# Patient Record
Sex: Male | Born: 1950 | Race: White | Hispanic: No | Marital: Married | Smoking: Never smoker
Health system: Southern US, Community
[De-identification: ages and names within clinical notes are randomized; demographics above are authoritative.]

## PROBLEM LIST (undated history)

## (undated) DIAGNOSIS — I2510 Atherosclerotic heart disease of native coronary artery without angina pectoris: Secondary | ICD-10-CM

## (undated) DIAGNOSIS — K219 Gastro-esophageal reflux disease without esophagitis: Secondary | ICD-10-CM

## (undated) DIAGNOSIS — I10 Essential (primary) hypertension: Secondary | ICD-10-CM

## (undated) DIAGNOSIS — E7800 Pure hypercholesterolemia, unspecified: Secondary | ICD-10-CM

## (undated) DIAGNOSIS — I219 Acute myocardial infarction, unspecified: Secondary | ICD-10-CM

## (undated) HISTORY — PX: COLONOSCOPY: SHX174

## (undated) HISTORY — PX: CARDIAC SURGERY: SHX584

## (undated) HISTORY — PX: OTHER SURGICAL HISTORY: SHX169

---

## 1996-04-07 DIAGNOSIS — I219 Acute myocardial infarction, unspecified: Secondary | ICD-10-CM

## 1996-04-07 HISTORY — DX: Acute myocardial infarction, unspecified: I21.9

## 1997-10-27 ENCOUNTER — Inpatient Hospital Stay (HOSPITAL_COMMUNITY): Admission: EM | Admit: 1997-10-27 | Discharge: 1997-10-31 | Payer: Self-pay | Admitting: Cardiology

## 2001-06-22 ENCOUNTER — Ambulatory Visit (HOSPITAL_COMMUNITY): Admission: RE | Admit: 2001-06-22 | Discharge: 2001-06-22 | Payer: Self-pay | Admitting: Internal Medicine

## 2001-12-21 ENCOUNTER — Encounter (HOSPITAL_COMMUNITY): Admission: RE | Admit: 2001-12-21 | Discharge: 2002-01-20 | Payer: Self-pay | Admitting: Internal Medicine

## 2001-12-22 ENCOUNTER — Encounter: Payer: Self-pay | Admitting: Internal Medicine

## 2003-06-15 ENCOUNTER — Ambulatory Visit (HOSPITAL_COMMUNITY): Admission: RE | Admit: 2003-06-15 | Discharge: 2003-06-15 | Payer: Self-pay | Admitting: Internal Medicine

## 2004-04-02 ENCOUNTER — Ambulatory Visit (HOSPITAL_COMMUNITY): Admission: RE | Admit: 2004-04-02 | Discharge: 2004-04-02 | Payer: Self-pay | Admitting: Internal Medicine

## 2004-04-22 ENCOUNTER — Emergency Department (HOSPITAL_COMMUNITY): Admission: EM | Admit: 2004-04-22 | Discharge: 2004-04-22 | Payer: Self-pay | Admitting: Emergency Medicine

## 2004-04-30 ENCOUNTER — Encounter (HOSPITAL_COMMUNITY): Admission: RE | Admit: 2004-04-30 | Discharge: 2004-05-01 | Payer: Self-pay | Admitting: Internal Medicine

## 2006-07-02 IMAGING — CR DG SHOULDER 2+V*R*
3 series · 3 of 3 positions shown · non-contrast
Comparison: none

HISTORY: Right shoulder pain, fall

RIGHT SHOULDER 3 VIEWS:
AC joint alignment normal.
No glenohumeral fracture or dislocation.
Mild diffuse bony demineralization.

[view not recorded (1 of 3)]
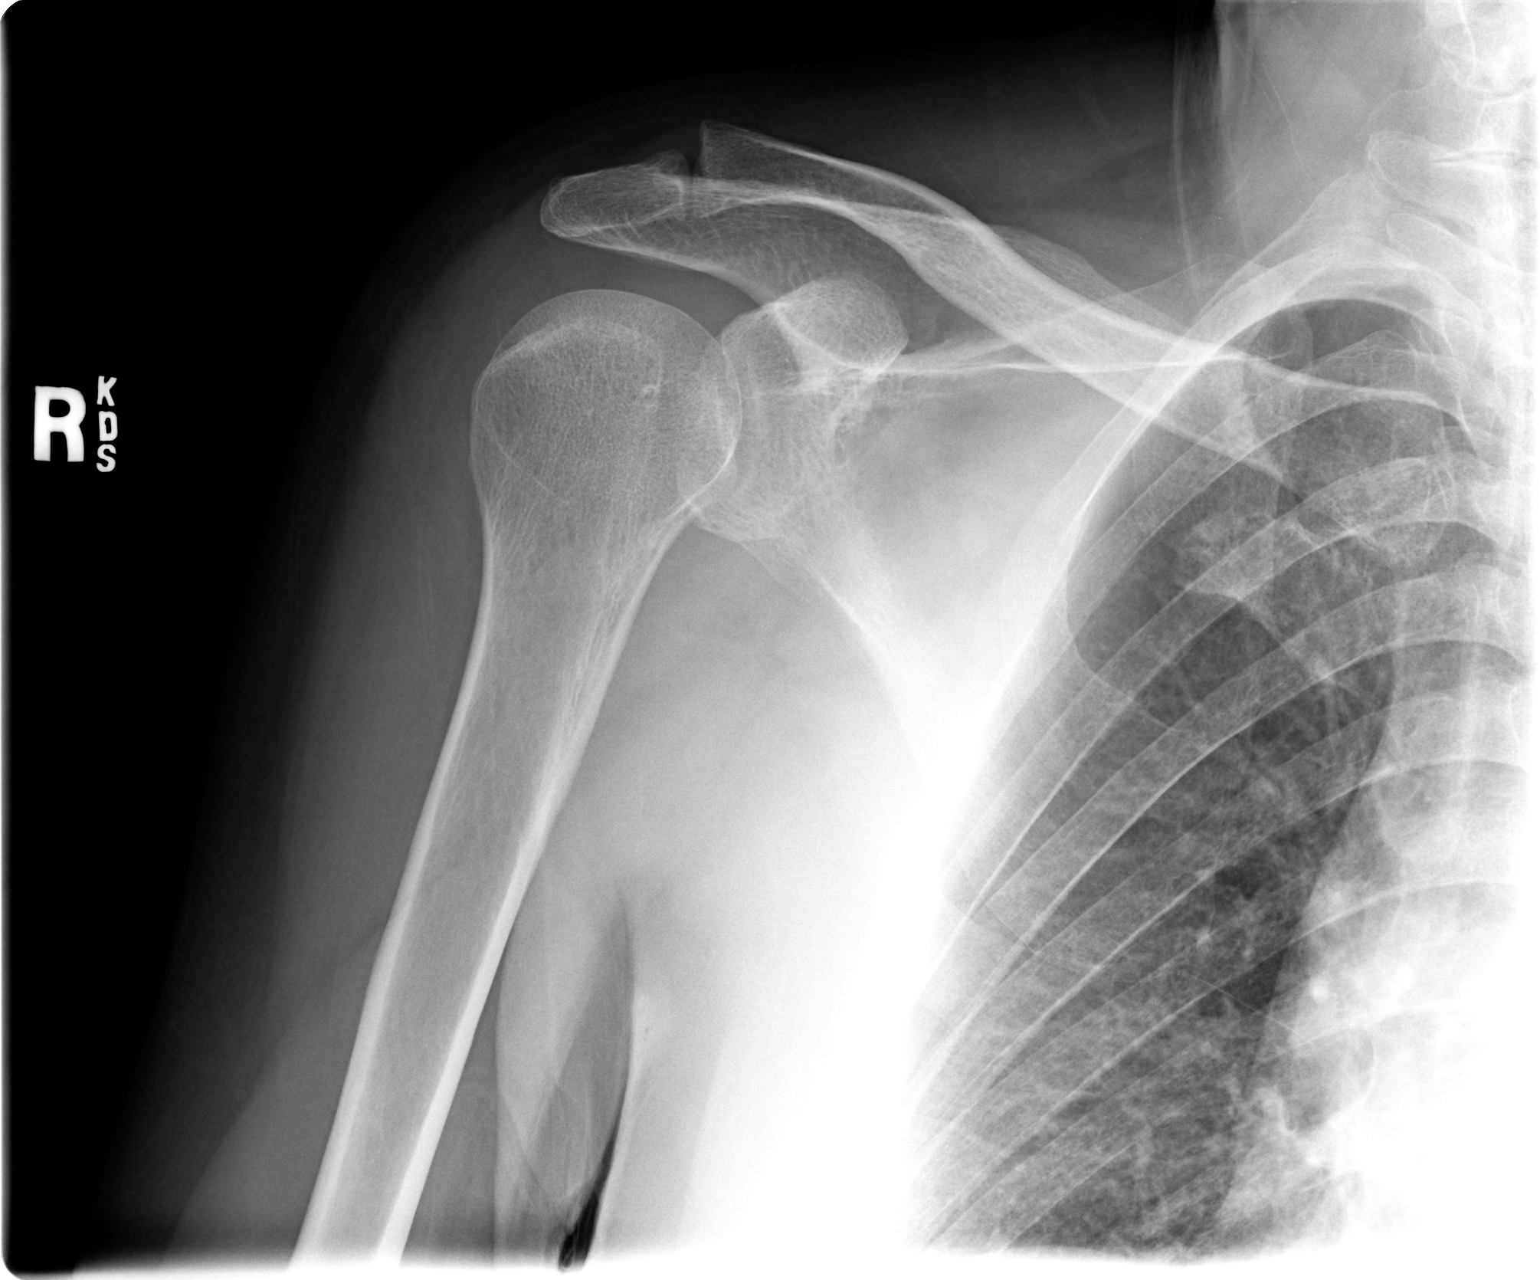

[view not recorded (2 of 3)]
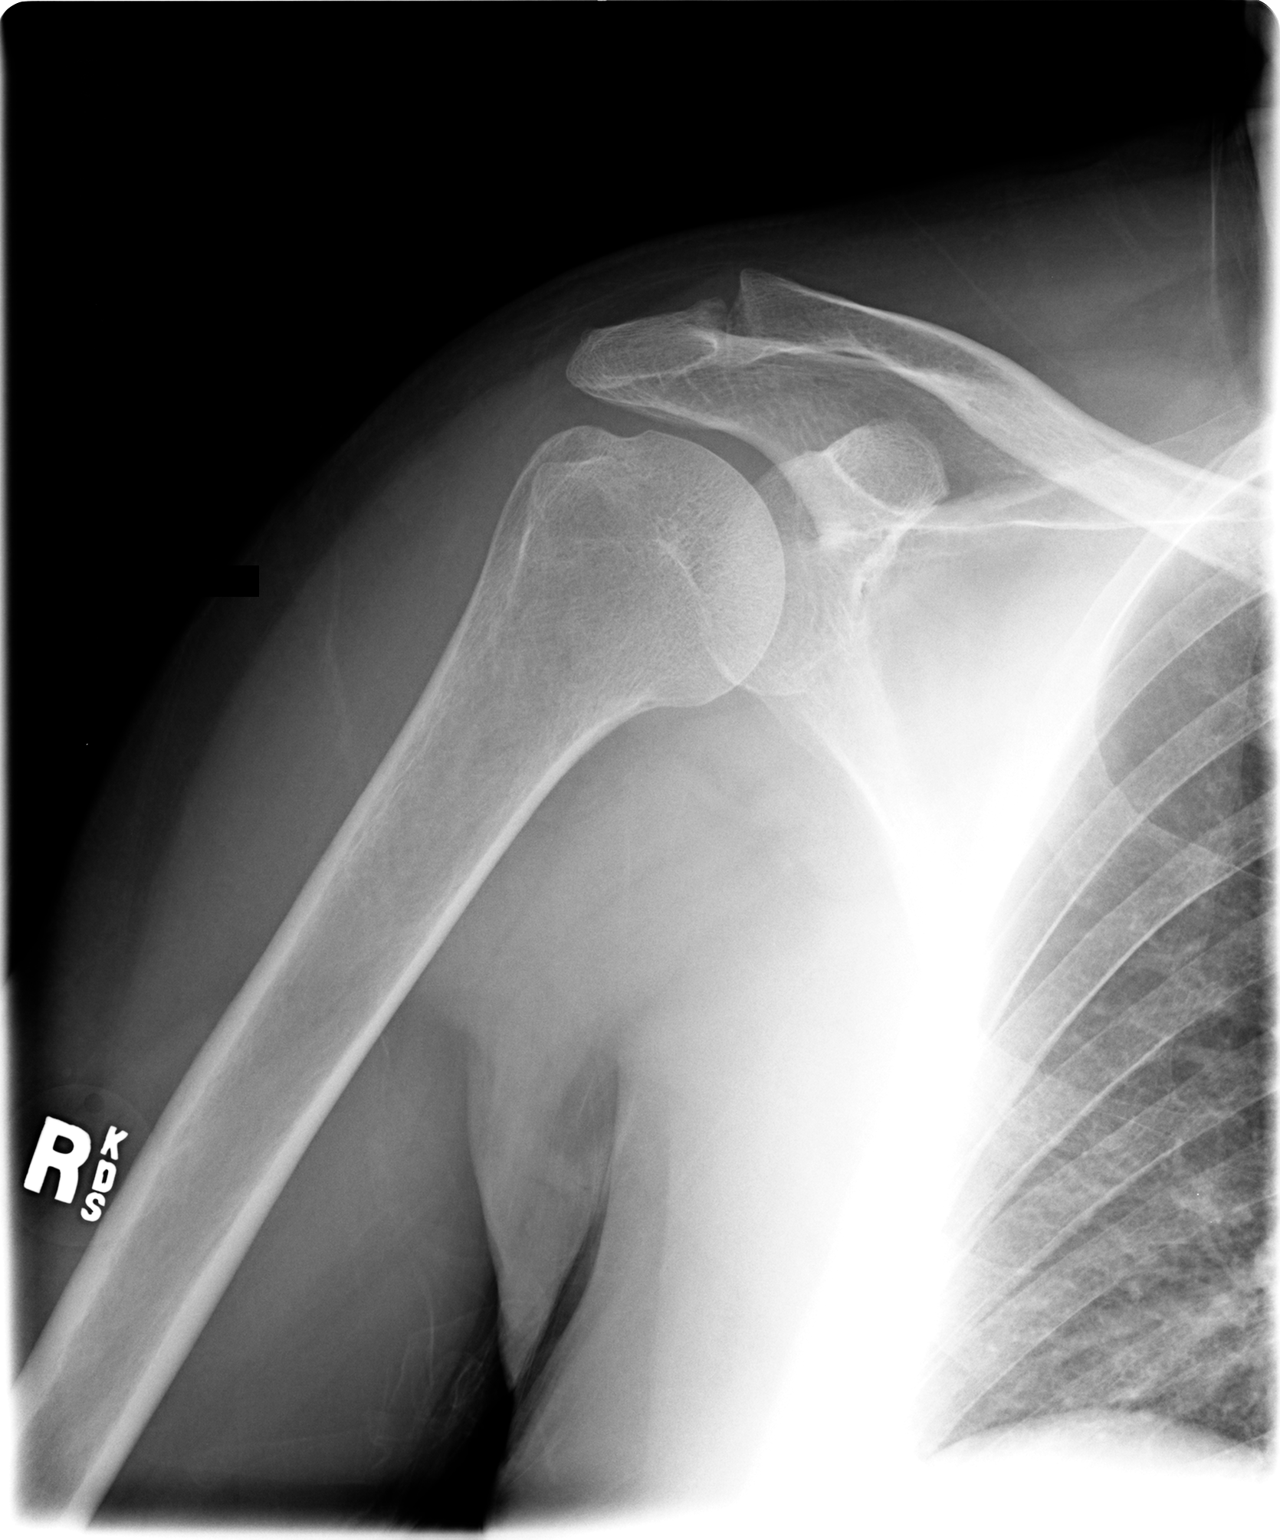

[view not recorded (3 of 3)]
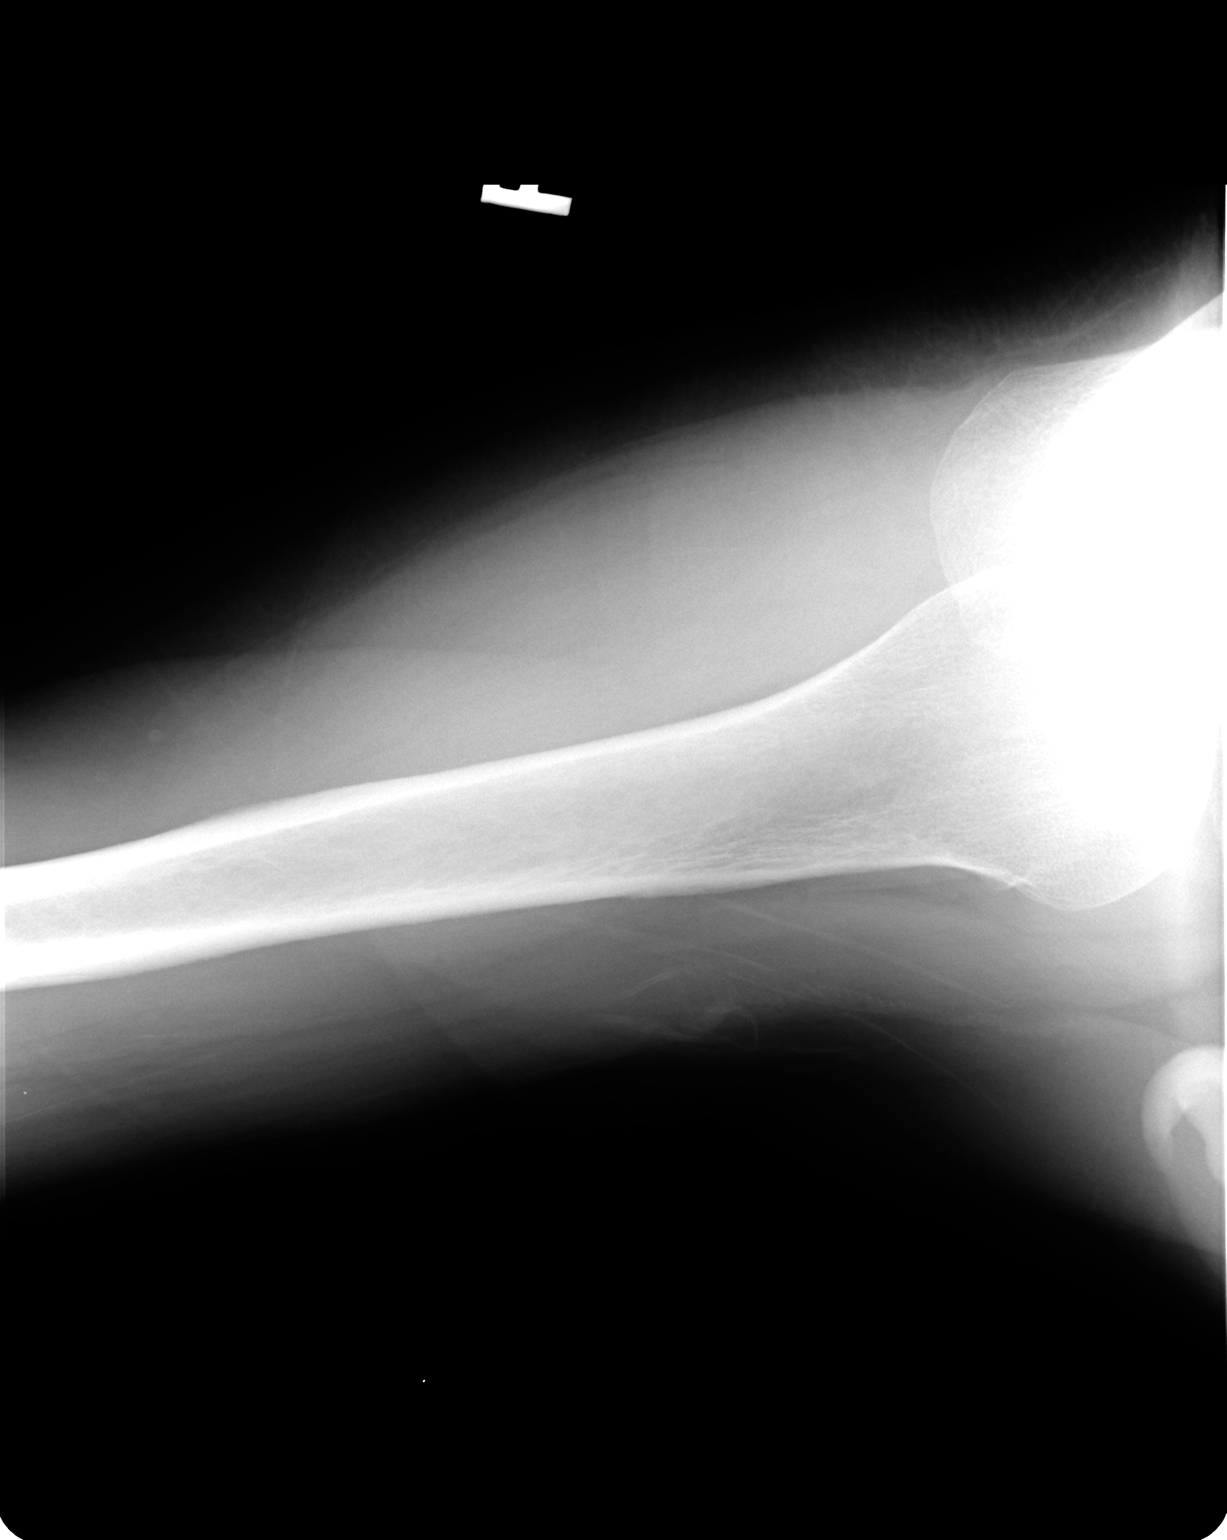

[3 of 3 positions shown; findings below may reference images not displayed]

IMPRESSION: No acute bony abnormalities.

## 2006-11-12 ENCOUNTER — Emergency Department (HOSPITAL_COMMUNITY): Admission: EM | Admit: 2006-11-12 | Discharge: 2006-11-12 | Payer: Self-pay | Admitting: Emergency Medicine

## 2006-12-18 ENCOUNTER — Ambulatory Visit: Payer: Self-pay | Admitting: Cardiology

## 2007-04-08 HISTORY — PX: CARDIAC CATHETERIZATION: SHX172

## 2007-06-25 ENCOUNTER — Encounter: Payer: Self-pay | Admitting: Emergency Medicine

## 2007-06-25 ENCOUNTER — Ambulatory Visit: Payer: Self-pay | Admitting: Cardiology

## 2007-06-25 ENCOUNTER — Inpatient Hospital Stay (HOSPITAL_COMMUNITY): Admission: AD | Admit: 2007-06-25 | Discharge: 2007-06-29 | Payer: Self-pay | Admitting: Cardiology

## 2007-06-28 ENCOUNTER — Encounter: Payer: Self-pay | Admitting: Cardiology

## 2007-07-02 ENCOUNTER — Ambulatory Visit: Payer: Self-pay | Admitting: Cardiology

## 2007-07-13 ENCOUNTER — Encounter (HOSPITAL_COMMUNITY): Admission: RE | Admit: 2007-07-13 | Discharge: 2007-08-12 | Payer: Self-pay | Admitting: Cardiology

## 2007-07-30 ENCOUNTER — Ambulatory Visit: Payer: Self-pay | Admitting: Internal Medicine

## 2007-08-13 ENCOUNTER — Encounter (HOSPITAL_COMMUNITY): Admission: RE | Admit: 2007-08-13 | Discharge: 2007-09-12 | Payer: Self-pay | Admitting: Cardiology

## 2007-09-13 ENCOUNTER — Encounter (HOSPITAL_COMMUNITY): Admission: RE | Admit: 2007-09-13 | Discharge: 2007-10-13 | Payer: Self-pay | Admitting: Cardiology

## 2007-10-15 ENCOUNTER — Encounter (HOSPITAL_COMMUNITY): Admission: RE | Admit: 2007-10-15 | Discharge: 2007-11-14 | Payer: Self-pay | Admitting: Cardiology

## 2007-12-23 ENCOUNTER — Ambulatory Visit: Payer: Self-pay | Admitting: Cardiovascular Disease

## 2007-12-23 ENCOUNTER — Encounter: Payer: Self-pay | Admitting: Emergency Medicine

## 2007-12-23 ENCOUNTER — Observation Stay (HOSPITAL_COMMUNITY): Admission: EM | Admit: 2007-12-23 | Discharge: 2007-12-24 | Payer: Self-pay | Admitting: Cardiology

## 2008-01-03 ENCOUNTER — Ambulatory Visit: Payer: Self-pay | Admitting: Cardiology

## 2008-04-18 ENCOUNTER — Ambulatory Visit: Payer: Self-pay | Admitting: Cardiology

## 2008-06-01 ENCOUNTER — Ambulatory Visit: Payer: Self-pay | Admitting: Cardiology

## 2008-12-31 ENCOUNTER — Inpatient Hospital Stay (HOSPITAL_COMMUNITY): Admission: EM | Admit: 2008-12-31 | Discharge: 2009-01-01 | Payer: Self-pay | Admitting: Emergency Medicine

## 2008-12-31 ENCOUNTER — Ambulatory Visit: Payer: Self-pay | Admitting: Cardiology

## 2008-12-31 ENCOUNTER — Encounter (INDEPENDENT_AMBULATORY_CARE_PROVIDER_SITE_OTHER): Payer: Self-pay | Admitting: *Deleted

## 2008-12-31 LAB — CONVERTED CEMR LAB
BUN: 10 mg/dL
CO2: 28 meq/L
Calcium: 9.2 mg/dL
Chloride: 110 meq/L
Creatinine, Ser: 0.81 mg/dL
GFR calc non Af Amer: >60 mL/min
Glomerular Filtration Rate, Af Am: >60 mL/min/{1.73_m2}
Glucose, Bld: 187 mg/dL
HCT: 42.5 %
Hemoglobin, Urine: NEGATIVE
Hemoglobin: 15.1 g/dL
MCV: 95.4 fL
Nitrite: NEGATIVE
Platelets: 157 10*3/uL
Potassium: 3.4 meq/L
Protein, ur: NEGATIVE mg/dL
Sodium: 142 meq/L
Specific Gravity, Urine: 1.02
Urine Glucose: 500 mg/dL
WBC number, urine, microscopy: NEGATIVE /hpf
WBC: 7.1 10*3/uL
pH: 6

## 2009-01-01 ENCOUNTER — Encounter: Payer: Self-pay | Admitting: Cardiology

## 2009-01-02 ENCOUNTER — Ambulatory Visit: Payer: Self-pay | Admitting: Cardiology

## 2009-01-02 ENCOUNTER — Encounter (HOSPITAL_COMMUNITY): Admission: RE | Admit: 2009-01-02 | Discharge: 2009-02-01 | Payer: Self-pay | Admitting: Cardiology

## 2009-01-15 DIAGNOSIS — E7800 Pure hypercholesterolemia, unspecified: Secondary | ICD-10-CM | POA: Insufficient documentation

## 2009-01-15 DIAGNOSIS — J310 Chronic rhinitis: Secondary | ICD-10-CM | POA: Insufficient documentation

## 2009-01-16 ENCOUNTER — Encounter: Payer: Self-pay | Admitting: Adult Health

## 2009-01-16 ENCOUNTER — Encounter (INDEPENDENT_AMBULATORY_CARE_PROVIDER_SITE_OTHER): Payer: Self-pay | Admitting: *Deleted

## 2009-01-16 ENCOUNTER — Ambulatory Visit: Payer: Self-pay | Admitting: Cardiology

## 2009-03-05 ENCOUNTER — Encounter (INDEPENDENT_AMBULATORY_CARE_PROVIDER_SITE_OTHER): Payer: Self-pay

## 2009-03-05 LAB — CONVERTED CEMR LAB
ALT: 15 units/L
AST: 15 units/L
Albumin: 4.4 g/dL
BUN: 14 mg/dL
CO2: 23 meq/L
Calcium: 9.2 mg/dL
Chloride: 110 meq/L
Cholesterol: 132 mg/dL
Creatinine, Ser: 0.79 mg/dL
Glucose, Bld: 89 mg/dL
HCT: 43.2 %
HDL: 3.6 mg/dL
HDL: 36 mg/dL
Hemoglobin: 15.2 g/dL
LDL Cholesterol: 60 mg/dL
MCV: 92.5 fL
Platelets: 161 10*3/uL
Potassium: 4.2 meq/L
Sodium: 143 meq/L
Total Protein: 6.5 g/dL
Triglycerides: 176 mg/dL
WBC: 6.6 10*3/uL

## 2009-03-12 ENCOUNTER — Encounter (INDEPENDENT_AMBULATORY_CARE_PROVIDER_SITE_OTHER): Payer: Self-pay

## 2009-08-26 DIAGNOSIS — I2510 Atherosclerotic heart disease of native coronary artery without angina pectoris: Secondary | ICD-10-CM | POA: Insufficient documentation

## 2009-08-26 DIAGNOSIS — K219 Gastro-esophageal reflux disease without esophagitis: Secondary | ICD-10-CM | POA: Insufficient documentation

## 2009-08-27 ENCOUNTER — Ambulatory Visit: Payer: Self-pay | Admitting: Cardiology

## 2009-09-23 IMAGING — CR DG CHEST 2V
2 series · 2 of 2 positions shown · non-contrast
Comparison: 06/25/07

CLINICAL DATA: Precardiac cath.  Chest pain, short of breath.
 CHEST ? 2 VIEW:

[w chest pa]
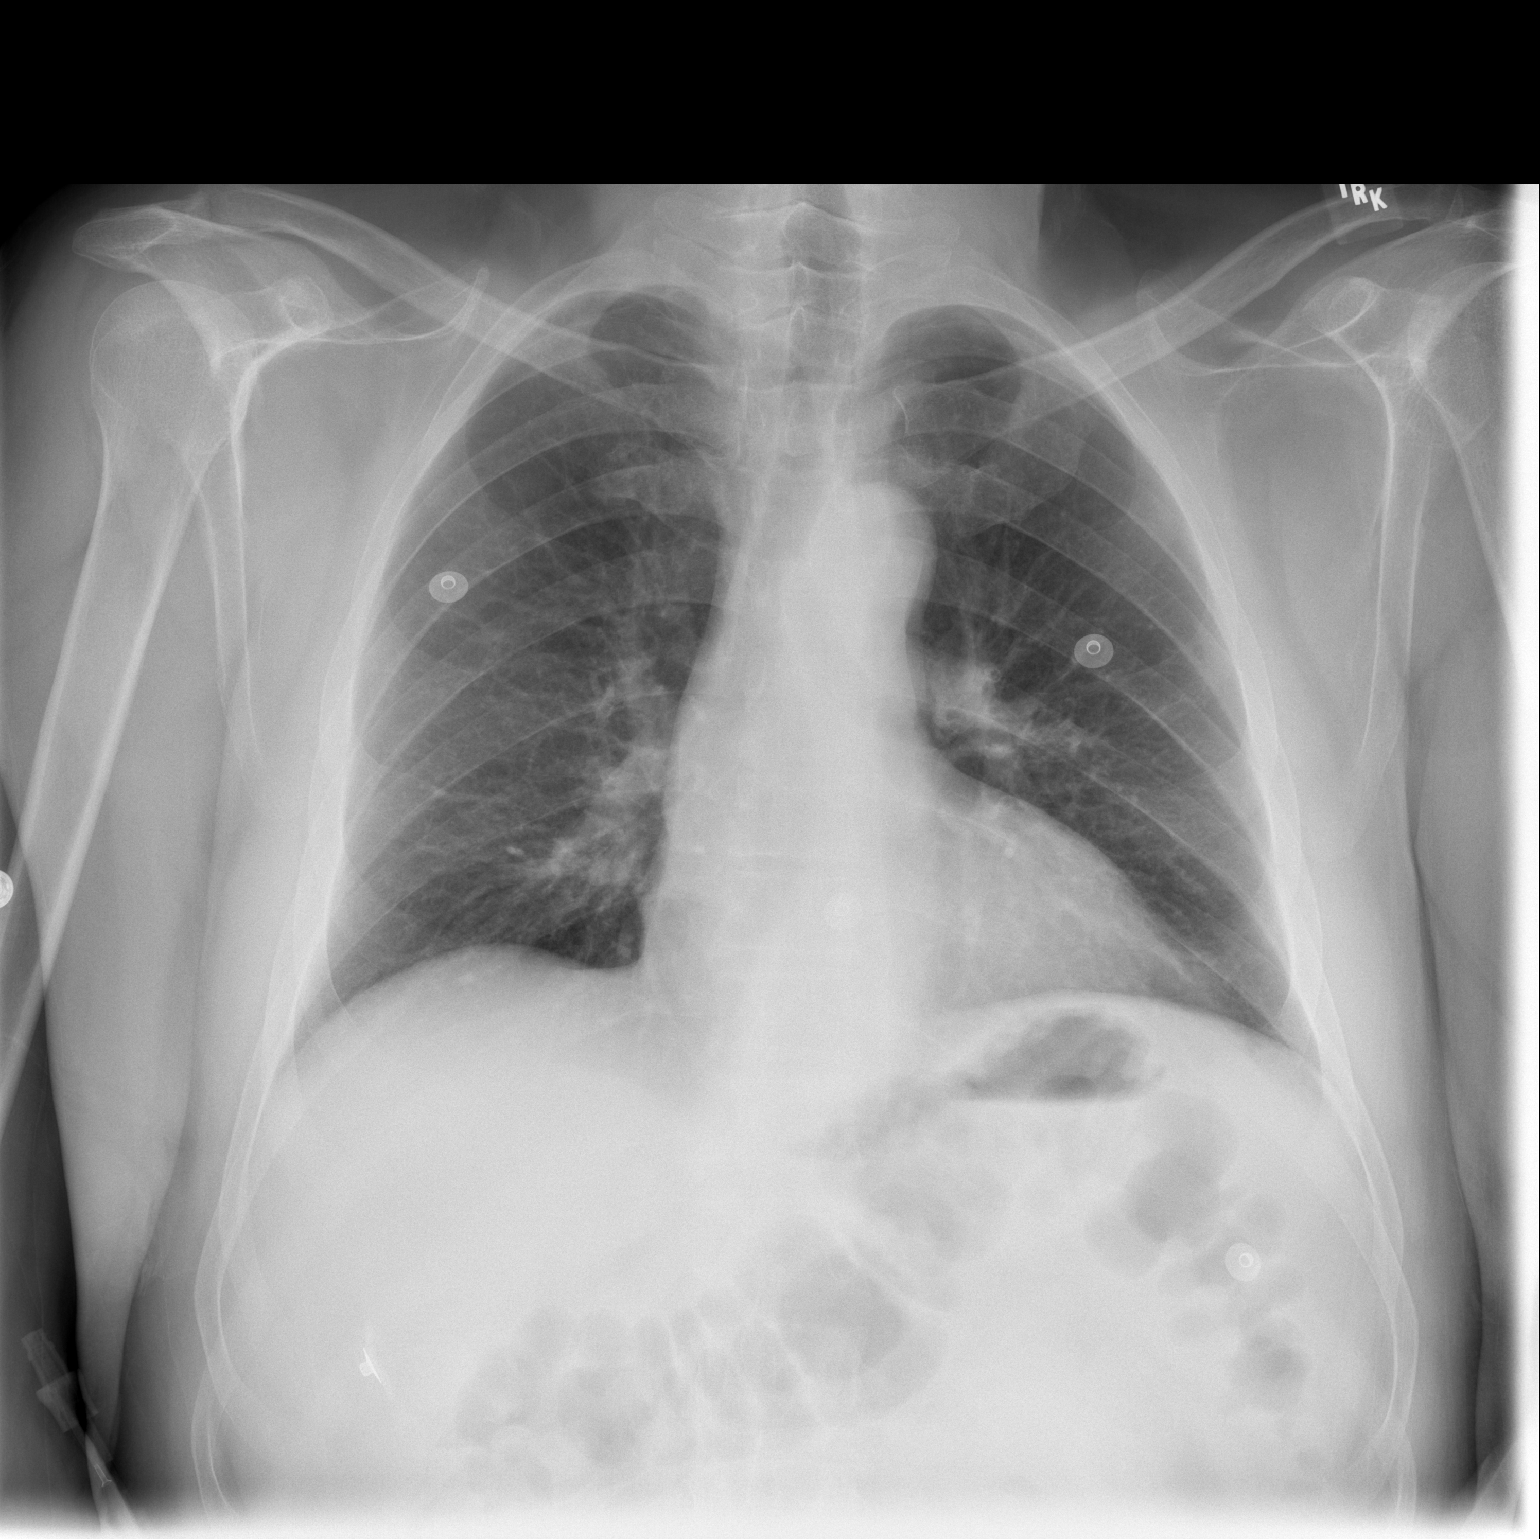

[w chest lat]
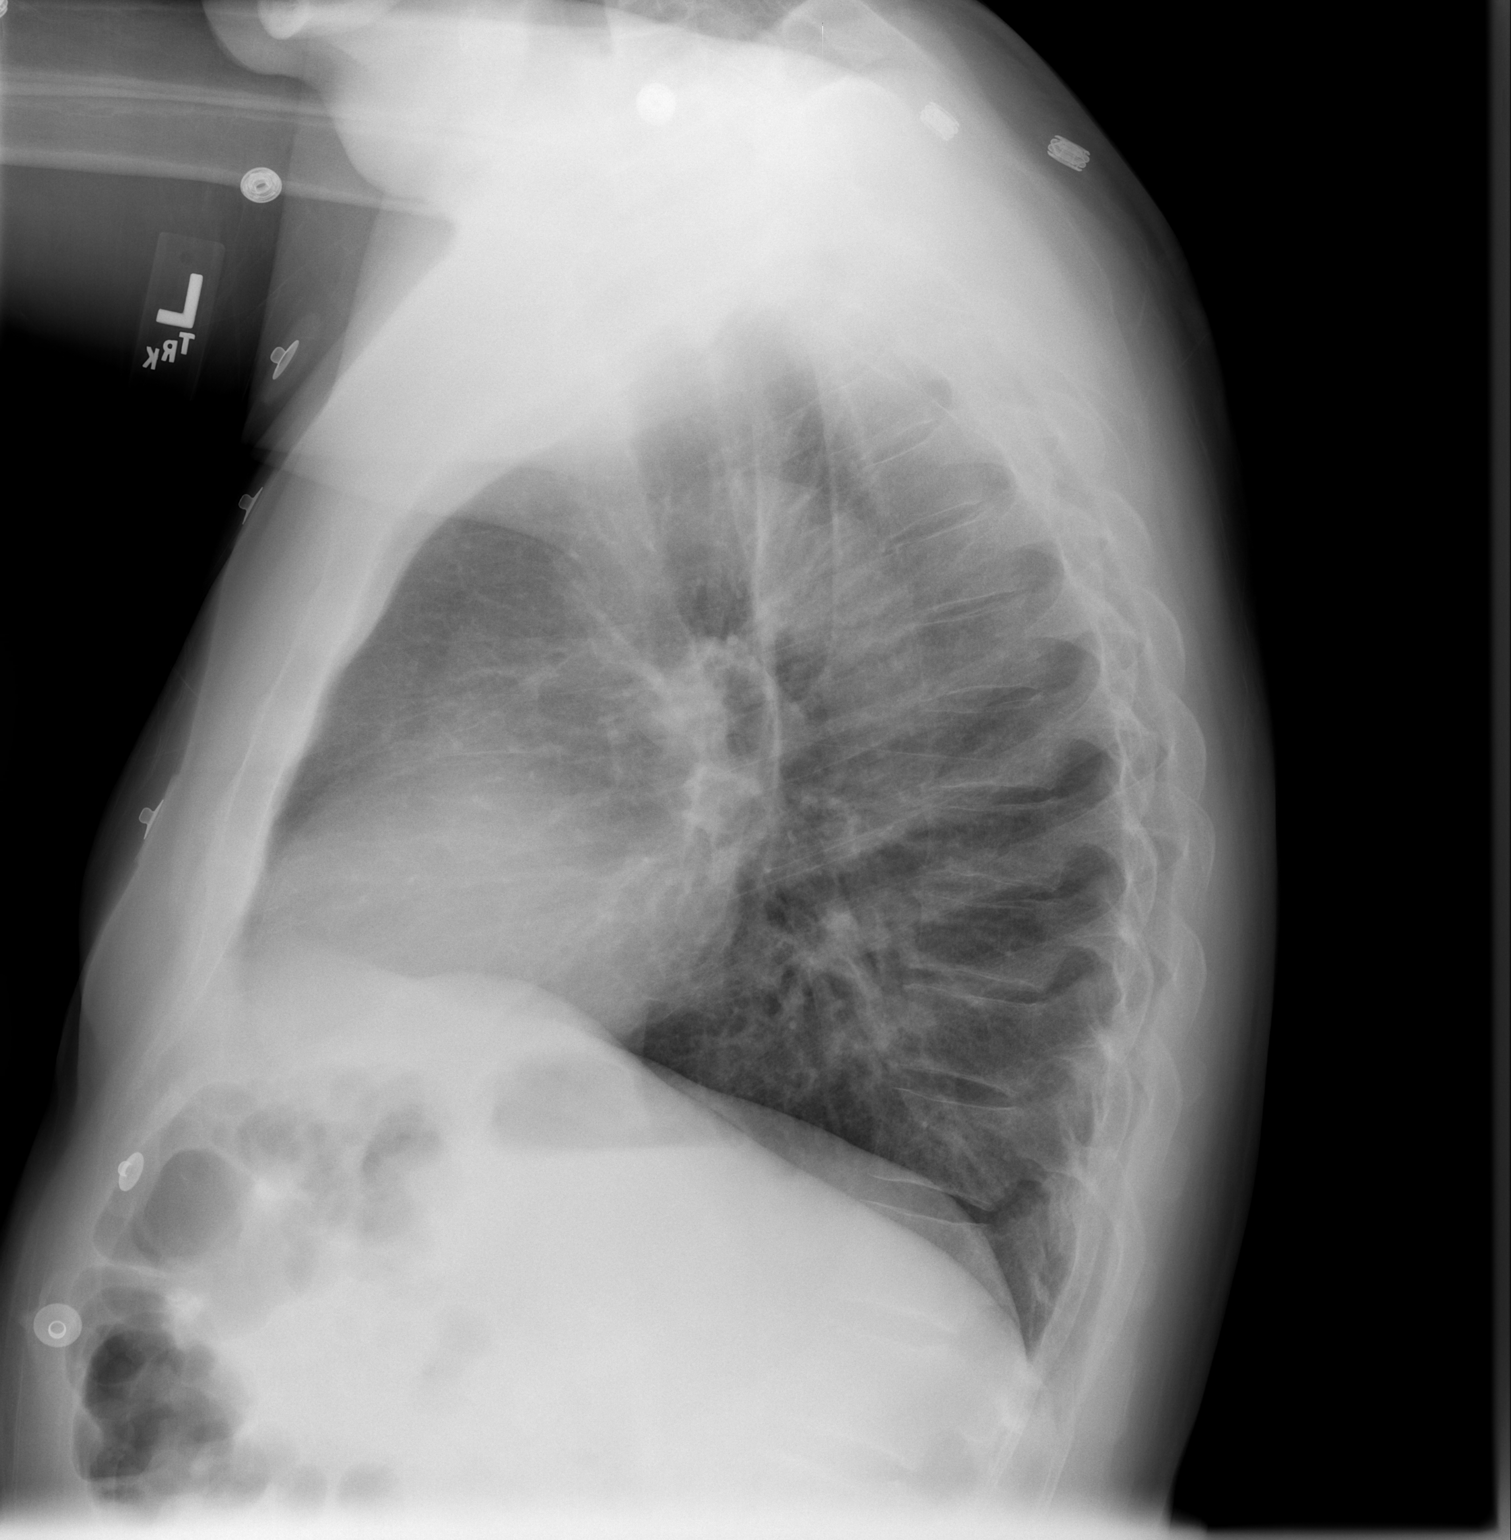

[2 of 2 positions shown; findings below may reference images not displayed]

FINDINGS: Two views of the chest show the lungs to be clear.  Mild peribronchial thickening is noted.  The heart is within normal limits in size.
IMPRESSION: Stable chest x-ray.  No active lung disease.

## 2009-11-08 ENCOUNTER — Ambulatory Visit: Payer: Self-pay | Admitting: Cardiology

## 2009-11-08 ENCOUNTER — Observation Stay (HOSPITAL_COMMUNITY): Admission: EM | Admit: 2009-11-08 | Discharge: 2009-11-09 | Payer: Self-pay | Admitting: Emergency Medicine

## 2009-11-08 ENCOUNTER — Encounter: Payer: Self-pay | Admitting: Cardiology

## 2009-11-20 ENCOUNTER — Ambulatory Visit: Payer: Self-pay | Admitting: Cardiology

## 2009-11-20 ENCOUNTER — Encounter (INDEPENDENT_AMBULATORY_CARE_PROVIDER_SITE_OTHER): Payer: Self-pay | Admitting: *Deleted

## 2009-11-20 DIAGNOSIS — R079 Chest pain, unspecified: Secondary | ICD-10-CM | POA: Insufficient documentation

## 2009-11-21 ENCOUNTER — Encounter: Payer: Self-pay | Admitting: Cardiology

## 2009-11-22 ENCOUNTER — Ambulatory Visit: Payer: Self-pay | Admitting: Cardiology

## 2009-12-06 ENCOUNTER — Encounter: Payer: Self-pay | Admitting: Cardiology

## 2010-03-08 ENCOUNTER — Encounter: Payer: Self-pay | Admitting: Cardiology

## 2010-03-08 LAB — CONVERTED CEMR LAB
ALT: 14 units/L
AST: 15 units/L
Albumin: 4.4 g/dL
Alkaline Phosphatase: 89 units/L
BUN: 12 mg/dL
Blood, UA: NEGATIVE
CO2: 24 meq/L
Calcium: 9.2 mg/dL
Chloride: 105 meq/L
Cholesterol: 121 mg/dL
Creatinine, Ser: 0.81 mg/dL
Glucose, Bld: 86 mg/dL
HCT: 41.4 %
HDL: 33 mg/dL
Hemoglobin: 14.5 g/dL
LDL Cholesterol: 62 mg/dL
MCV: 92 fL
Nitrite: NEGATIVE
PSA: 4.73 ng/mL
Platelets: 166 10*3/uL
Potassium: 4.3 meq/L
Protein, ur: NEGATIVE mg/dL
Sodium: 141 meq/L
Specific Gravity, Urine: 1.019
Total Protein: 6.3 g/dL
Triglycerides: 130 mg/dL
Urine Glucose: NEGATIVE mg/dL
WBC: 6.2 10*3/uL
pH: 7

## 2010-04-03 ENCOUNTER — Ambulatory Visit
Admission: RE | Admit: 2010-04-03 | Discharge: 2010-04-03 | Payer: Self-pay | Source: Home / Self Care | Attending: Cardiology | Admitting: Cardiology

## 2010-04-04 ENCOUNTER — Encounter: Payer: Self-pay | Admitting: Cardiology

## 2010-05-09 NOTE — Assessment & Plan Note (Signed)
Summary: 6 mth f/u per checkout on 01/16/09/tg  Medications Added FISH OIL 1000 MG CAPS (OMEGA-3 FATTY ACIDS) Take 1 tablet by mouth two times a day      Allergies Added: NKDA  Visit Type:  Follow-up Primary Provider:  Dr.Roy Fagen   History of Present Illness: [name] returns to the office as scheduled for continued assessment and treatment of coronary disease and cardiovascular risk factors.  Since a multivessel stenting procedure in 2009, he has been asymptomatic from a cardiovascular standpoint.  He is generally sedentary, but will walk up to a few miles per day without difficulty.  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  He has no orthopnea nor PND.  He experiences no chest discomfort.  He sometimes notes some dyspnea, but this generally is associated with nasal congestion and resolves with an antihistamine.  Blood pressure control and control of hyperlipidemia have been good as far as he knows.  Current Medications (verified): 1)  Lipitor 20 Mg Tabs (Atorvastatin Calcium) 2)  Isoptin Sr 180 Mg Cr-Tabs (Verapamil Hcl) .... Take 1 Tab Daily 3)  Aspir-Low 81 Mg Tbec (Aspirin) .... Take 1 Tab Daily 4)  Plavix 75 Mg Tabs (Clopidogrel Bisulfate) .... Take 1 Tab Daily 5)  Folic Acid 1 Mg Tabs (Folic Acid) .... Take 1 Tab Daily 6)  Ranitidine Hcl 300 Mg Tabs (Ranitidine Hcl) .... Take 1 Tab Two Times A Day 7)  Fish Oil 1000 Mg Caps (Omega-3 Fatty Acids) .... Take 1 Tablet By Mouth Two Times A Day  Allergies (verified): No Known Drug Allergies  -  Date:  03/05/2009    HDL: 36   Past History:  PMH, FH, and Social History reviewed and updated.  Review of Systems       See history of present illness  Vital Signs:  Patient profile:   60 year old male Weight:      188 pounds Pulse rate:   65 / minute BP sitting:   124 / 76  (right arm)  Vitals Entered By: Dreama Saa, CNA (Aug 27, 2009 2:03 PM)  Physical  Exam  General:    Minimally overweight; well developed; no acute distress:   Neck-No JVD; no carotid bruits: Lungs-No tachypnea, no rales; no rhonchi; no wheezes: Cardiovascular-normal PMI; normal S1 and S2; S4 present Abdomen-BS normal; soft and non-tender without masses or organomegaly:  Musculoskeletal-No deformities, no cyanosis or clubbing: Neurologic-Normal cranial nerves; symmetric strength and tone:  Skin-Warm, no significant lesions: Extremities-Nl distal pulses; no edema:     Impression & Recommendations:  Problem # 1:  HYPERTENSION (ICD-401.1) Blood pressure control is excellent.  Current medications will be continued.  Problem # 2:  HYPERLIPIDEMIA (ICD-272.4) Control of hyperlipidemia is quite good with total cholesterol of  132, HDL of 36, triglycerides of 176 and LDL of 60 on a recent profile.  He pays only $17 per month for atorvastatin, so there is no much to gain by switching to a generic.  I suggested he add fish oil one capsule b.i.d. to his regimen.  Problem # 3:  ATHEROSCLEROTIC CARDIOVASCULAR DISEASE (ICD-429.2) No symptoms to suggest recurrent myocardial ischemia.  Patient is cautioned to call should he develop dyspnea or chest discomfort.  I will reassess this nice gentleman in one year.  Patient Instructions: 1)  Your physician recommends that you schedule a follow-up appointment in: 1 year 2)  Your physician has recommended you make the following change  in your medication: Start taking 1000mg  tablet of Fish oil by mouth two times a day

## 2010-05-09 NOTE — Letter (Signed)
Summary: [place] [employer] [facility], agricultural at [facility]  [address], [place] [address]   Phone: [phone]  Fax: [phone]      December 06, 2009 MRN: [id number]   [facility] [address] [place], [place]  [address]   Dear Mr. Wirthlin,  Your test ordered by Selena Batten has been reviewed by your physician (or physician assistant) and was found to be normal or stable. Your physician (or physician assistant) felt no changes were needed at this time.  ____ Echocardiogram  __X__ Cardiac Stress Test  ____ Lab Work  ____ Peripheral vascular study of arms, legs or neck  ____ CT scan or X-ray  ____ Lung or Breathing test  ____ Other:  Please continue on current medical treatment.  Thank you.   Graham Bing, MD, F.A.C.C

## 2010-05-09 NOTE — Consult Note (Signed)
Summary: [facility]   Imported By: Roderic Ovens 11/14/2009 16:26:25  _____________________________________________________________________  External Attachment:    Type:   Image     Comment:   External Document

## 2010-05-09 NOTE — Miscellaneous (Signed)
Summary: CBC w/ Diff, CMP, Lipid, PSA  Clinical Lists Changes  Observations: Added new observation of BACTERIA URN: none (03/08/2010 11:10) Added new observation of RBCS MICRO U: 0-2 (03/08/2010 11:10) Added new observation of WBCS MICRO U: 0-2 (03/08/2010 11:10) Added new observation of CASTS URINE: none (03/08/2010 11:10) Added new observation of WBC UR: 0-2 (03/08/2010 11:10) Added new observation of NITRITE URN: neg (03/08/2010 11:10) Added new observation of PROTEIN UR: neg (03/08/2010 11:10) Added new observation of BLOOD UR: neg (03/08/2010 11:10) Added new observation of GLUCOSE UA: neg (03/08/2010 11:10) Added new observation of PH URINE: 7.0  (03/08/2010 11:10) Added new observation of SPEC GR URIN: 1.019  (03/08/2010 11:10) Added new observation of CALCIUM: 9.2 mg/dL (54/12/8117 14:78) Added new observation of ALBUMIN: 4.4 g/dL (29/56/2130 86:57) Added new observation of PROTEIN, TOT: 6.3 g/dL (84/69/6295 28:41) Added new observation of SGPT (ALT): 14 units/L (03/08/2010 11:10) Added new observation of SGOT (AST): 15 units/L (03/08/2010 11:10) Added new observation of ALK PHOS: 89 units/L (03/08/2010 11:10) Added new observation of BILI DIRECT: total bili   1.0 mg/dL (32/44/0102 72:53) Added new observation of CREATININE: 0.81 mg/dL (66/44/0347 42:59) Added new observation of BUN: 12 mg/dL (56/38/7564 33:29) Added new observation of BG RANDOM: 86 mg/dL (51/88/4166 06:30) Added new observation of CO2 PLSM/SER: 24 meq/L (03/08/2010 11:10) Added new observation of CL SERUM: 105 meq/L (03/08/2010 11:10) Added new observation of K SERUM: 4.3 meq/L (03/08/2010 11:10) Added new observation of NA: 141 meq/L (03/08/2010 11:10) Added new observation of LDL: 62 mg/dL (16/04/930 35:57) Added new observation of HDL: 33 mg/dL (32/20/2542 70:62) Added new observation of TRIGLYC TOT: 130 mg/dL (37/62/8315 17:61) Added new observation of CHOLESTEROL: 121 mg/dL (60/73/7106 26:94) Added  new observation of PLATELETK/UL: 166 K/uL (03/08/2010 11:10) Added new observation of MCV: 92.0 fL (03/08/2010 11:10) Added new observation of HCT: 41.4 % (03/08/2010 11:10) Added new observation of HGB: 14.5 g/dL (85/46/2703 50:09) Added new observation of WBC COUNT: 6.2 10*3/microliter (03/08/2010 11:10) Added new observation of PSA: 4.73 ng/mL (03/08/2010 11:10)

## 2010-05-09 NOTE — Miscellaneous (Signed)
Summary: chest xray 11/08/2009  Clinical Lists Changes  Observations: Added new observation of CXR RESULTS:  Clinical Data: Chest pain, tingling in the right arm    PORTABLE CHEST - 1 VIEW    Comparison: Chest x-ray of 12/31/2008    Findings: The lungs are clear.  Mild cardiomegaly is stable.   Mediastinal contours are stable.  No bony abnormality is seen.    IMPRESSION:     Stable mild cardiomegaly.  No active lung disease.    Read By:  Juline Patch,  M.D.   Released By:  Juline Patch,  M.D.  (11/08/2009 15:52)      CXR  Procedure date:  11/08/2009  Findings:       Clinical Data: Chest pain, tingling in the right arm    PORTABLE CHEST - 1 VIEW    Comparison: Chest x-ray of 12/31/2008    Findings: The lungs are clear.  Mild cardiomegaly is stable.   Mediastinal contours are stable.  No bony abnormality is seen.    IMPRESSION:     Stable mild cardiomegaly.  No active lung disease.    Read By:  Juline Patch,  M.D.   Released By:  Juline Patch,  M.D.

## 2010-05-09 NOTE — Assessment & Plan Note (Signed)
Summary: post hosp [facility] per Tammy on 300/tg  Medications Added LIPITOR 20 MG TABS (ATORVASTATIN CALCIUM) take 1 tab daily PANTOPRAZOLE SODIUM 40 MG TBEC (PANTOPRAZOLE SODIUM) take 1 tab daily      Allergies Added: NKDA  Visit Type:  Follow-up Primary Provider:  Dr.Roy Fagen   History of Present Illness: Mr. Fadi Menter returns to the office following a recent observation stay at [facility] to rule out myocardial infarction.  He experienced 6 days of intermittent chest discomfort, which was poorly characterized, of moderate intensity, and located over the upper anterior chest.  There was no associated dyspnea, diaphoresis nor nausea.  There was no relationship to exertion or body position, nor was there chest wall tenderness.  Symptoms gradually resolved after a PPI was substituted for his H2 blocker.  The patient feels fine at present.  An outpatient stress test was planned at the time of hospital discharge, but not scheduled.  Current Medications (verified): 1)  Lipitor 20 Mg Tabs (Atorvastatin Calcium) .... Take 1 Tab Daily 2)  Isoptin Sr 180 Mg Cr-Tabs (Verapamil Hcl) .... Take 1 Tab Daily 3)  Aspir-Low 81 Mg Tbec (Aspirin) .... Take 1 Tab Daily 4)  Folic Acid 1 Mg Tabs (Folic Acid) .... Take 1 Tab Daily 5)  Fish Oil 1000 Mg Caps (Omega-3 Fatty Acids) .... Take 1 Tablet By Mouth Two Times A Day 6)  Pantoprazole Sodium 40 Mg Tbec (Pantoprazole Sodium) .... Take 1 Tab Daily  Allergies (verified): No Known Drug Allergies  Past History:  PMH, FH, and Social History reviewed and updated.  Past Medical History: ASCVD: remote MI; cath in 3/09 revealed 80-90% lesions in the LAD, first diagonal and RCA resulting in       placement of 3 DES; negative stress nuclear-9/09 and 10/10; R/O MI-11/2009 Syncope-presumed secondary to NTG Hypertension Hyperlipidemia Gastroesophageal reflux disease Left hand paresthesias-possible carpal tunnel  EKG  Procedure date:   11/20/2009  Findings:      Normal sinus rhythm Within normal limits Comparison with prior study of 12/18/06: Inferior Q waves less prominent; delayed R-wave progression improved   Review of Systems       See history of present illness.  Vital Signs:  Patient profile:   60 year old male Weight:      188 pounds BMI:     29.55 Pulse rate:   57 / minute BP sitting:   111 / 70  (right arm)  Vitals Entered By: Dreama Saa, CNA (November 20, 2009 11:05 AM)  Physical Exam  General:  Minimally overweight; well developed; no acute distress:   Neck-No JVD; no carotid bruits: Lungs-No tachypnea, no rales; no rhonchi; no wheezes: Cardiovascular-normal PMI; normal S1 and S2; prominent S4 present Abdomen-BS normal; soft and non-tender without masses or organomegaly:  Musculoskeletal-No deformities, no cyanosis or clubbing: Neurologic-Normal cranial nerves; symmetric strength and tone:  Skin-Warm, no significant lesions: Extremities-bounding distal pulses; no edema:     Impression & Recommendations:  Problem # 1:  ATHEROSCLEROTIC CARDIOVASCULAR DISEASE (ICD-429.2) Chest pain was not thought to represent myocardial ischemia by any of the physicians who evaluated Mr. Leeds in hospital.  Nonetheless, symptoms are not totally atypical.  We will proceed with a standard treadmill exercise test.  Problem # 2:  GASTROESOPHAGEAL REFLUX DISEASE (ICD-530.81) Symptoms were not entirely consistent with gastroesophageal reflux disease, but substitution of a PPI for ranitidine is reasonable and will be continued.  Problem # 3:  HYPERTENSION (ICD-401.1) Blood pressure control is good on a  single agent, which will be continued.  If stress testing is negative as anticipated, I will plan to reassess this nice gentleman in May of 2012 as previously scheduled.  Other Orders: Treadmill (Treadmill)  Patient Instructions: 1)  Your physician recommends that you schedule a follow-up appointment in:  May, 2012 2)  Your physician has recommended you make the following change in your medication: Stop taking Plavix 3)  Your physician has requested that you have an exercise tolerance test.  For further information please visit [email or web address]  Please also follow instruction sheet, as given.

## 2010-05-09 NOTE — Assessment & Plan Note (Signed)
Summary: ROV CHEST DISCOMFORT  Medications Added LIPITOR 10 MG TABS (ATORVASTATIN CALCIUM) take 1 tab daily PRILOSEC 20 MG CPDR (OMEPRAZOLE) take 1 tab daily      Allergies Added: NKDA  Visit Type:  Follow-up Primary Provider:  Dr.Roy Fagen   History of Present Illness: [name] is seen in the office today at his request for chest discomfort.  Over the past few days, he has noted upper anterior chest tightness and pressure at the posterior base of the neck without associated symptoms.  There is no association with exertion.  Symptoms of been intermittent, but with no clear relationship to any identifiable factor.  Discomfort was present up until yesterday, but has been virtually absent today.  He has recently been cutting wood and wonders whether these symptoms are musculoskeletal.  He has no chest wall tenderness.  He has noted a slight increase in exertional dyspnea.  Current Medications (verified): 1)  Lipitor 10 Mg Tabs (Atorvastatin Calcium) .... Take 1 Tab Daily 2)  Isoptin Sr 180 Mg Cr-Tabs (Verapamil Hcl) .... Take 1 Tab Daily 3)  Aspir-Low 81 Mg Tbec (Aspirin) .... Take 1 Tab Daily 4)  Folic Acid 1 Mg Tabs (Folic Acid) .... Take 1 Tab Daily 5)  Fish Oil 1000 Mg Caps (Omega-3 Fatty Acids) .... Take 1 Tablet By Mouth Two Times A Day 6)  Prilosec 20 Mg Cpdr (Omeprazole) .... Take 1 Tab Daily  Allergies (verified): No Known Drug Allergies  Comments:  Nurse/Medical Assistant: patient brought med list Dr.Fagan changed patient from lipitor 20 to lipitor 10 mg daily due to joint pain patient uses [facility]  Past History:  PMH, FH, and Social History reviewed and updated.  Review of Systems  The patient denies weight loss, weight gain, hoarseness, syncope, peripheral edema, prolonged cough, and headaches.    Vital Signs:  Patient profile:   60 year old male Weight:      187 pounds BMI:     29.39 O2 Sat:      98 % on Room air Pulse rate:   65 / minute BP  sitting:   135 / 90  (left arm)  Vitals Entered By: Dreama Saa, CNA (April 03, 2010 2:55 PM)  O2 Flow:  Room air  Physical Exam  General:  Minimally overweight; well developed; no acute distress:   Neck-No JVD; right carotid bruits: Lungs-No tachypnea, no rales; no rhonchi; no wheezes: Cardiovascular-normal PMI; normal S1 and S2; prominent S4 present Abdomen-BS normal; soft and non-tender without masses or organomegaly:  Musculoskeletal-No deformities, no cyanosis or clubbing: Neurologic-Normal cranial nerves; symmetric strength and tone:  Skin-Warm, no significant lesions: Extremities-bounding distal pulses; no edema:     EKG  Procedure date:  04/03/2010  Findings:      Normal sinus rhythm Delayed R-wave progression-cannot exclude previous anterior MI Low voltage in the chest leads Otherwise within normal limits No previous tracing for comparison.   Impression & Recommendations:  Problem # 1:  CHEST PAIN (ICD-786.50) Symptoms are somewhat concerning, but far from classic for myocardial ischemia.  Stress nuclear study just 3 months ago revealed normal LV size and function and no evidence for ischemia.  Repeat EKG is unchanged.  I suggested that the patient utilize nitroglycerin as needed should these symptoms recur.  He is reluctant to take this drug because he nearly suffered a syncopal spell following initial administration.  I explained that sitting or lying for a few minutes following use of the drug will prevent this problem.  I do not think that he is presenting with an acute ischemic syndrome or that cardiac catheterization is necessary for optimal therapy.  Problem # 2:  HYPERTENSION (ICD-401.1) Blood pressure is good; current medication will be continued.  Problem # 3:  HYPERLIPIDEMIA (ICD-272.4) Lipid profile is excellent; current therapy will be continued.  CHOL: 121 (03/08/2010)   LDL: 62 (03/08/2010)   HDL: 33 (03/08/2010)   TG: 130  (03/08/2010)  Patient Instructions: 1)  Your physician recommends that you schedule a follow-up appointment in May 2012.

## 2010-05-09 NOTE — Letter (Signed)
Summary: CONSULT 11/08/09  CONSULT 11/08/09   Imported By: Faythe Ghee 11/21/2009 09:51:49  _____________________________________________________________________  External Attachment:    Type:   Image     Comment:   External Document

## 2010-06-21 LAB — CBC
HCT: 42.2 % (ref 39.0–52.0)
Hemoglobin: 14.7 g/dL (ref 13.0–17.0)
MCH: 33.1 pg (ref 26.0–34.0)
MCHC: 35 g/dL (ref 30.0–36.0)

## 2010-06-21 LAB — BASIC METABOLIC PANEL
BUN: 11 mg/dL (ref 6–23)
CO2: 26 mEq/L (ref 19–32)
Calcium: 9.5 mg/dL (ref 8.4–10.5)
Chloride: 110 mEq/L (ref 96–112)
Creatinine, Ser: 0.86 mg/dL (ref 0.4–1.5)
GFR calc Af Amer: >60 mL/min (ref >60)
GFR calc non Af Amer: >60 mL/min (ref >60)
Glucose, Bld: 108 mg/dL — ABNORMAL HIGH (ref 70–99)
Potassium: 4 mEq/L (ref 3.5–5.1)
Sodium: 142 mEq/L (ref 135–145)

## 2010-06-21 LAB — CARDIAC PANEL(CRET KIN+CKTOT+MB+TROPI)
CK, MB: 0.7 ng/mL (ref 0.3–4.0)
CK, MB: 0.7 ng/mL (ref 0.3–4.0)
CK, MB: 0.7 ng/mL (ref 0.3–4.0)
Relative Index: INVALID (ref 0.0–2.5)
Relative Index: INVALID (ref 0.0–2.5)
Total CK: 65 U/L (ref 7–232)
Total CK: 67 U/L (ref 7–232)
Total CK: 76 U/L (ref 7–232)
Troponin I: 0.01 ng/mL (ref 0.00–0.06)
Troponin I: <0.01 ng/mL (ref 0.00–0.06)

## 2010-06-21 LAB — POCT CARDIAC MARKERS: Troponin i, poc: <0.05 ng/mL (ref 0.00–0.09)

## 2010-06-21 LAB — DIFFERENTIAL
Basophils Absolute: 0 10*3/uL (ref 0.0–0.1)
Monocytes Relative: 5 % (ref 3–12)

## 2010-07-12 LAB — BASIC METABOLIC PANEL
BUN: 10 mg/dL (ref 6–23)
CO2: 28 mEq/L (ref 19–32)
Calcium: 9.2 mg/dL (ref 8.4–10.5)
Glucose, Bld: 187 mg/dL — ABNORMAL HIGH (ref 70–99)
Sodium: 142 mEq/L (ref 135–145)

## 2010-07-12 LAB — URINALYSIS, ROUTINE W REFLEX MICROSCOPIC
Bilirubin Urine: NEGATIVE
Hgb urine dipstick: NEGATIVE
Ketones, ur: NEGATIVE mg/dL
Specific Gravity, Urine: 1.02 (ref 1.005–1.030)
Urobilinogen, UA: 0.2 mg/dL (ref 0.0–1.0)

## 2010-07-12 LAB — CBC
HCT: 42.5 % (ref 39.0–52.0)
Hemoglobin: 15.1 g/dL (ref 13.0–17.0)
MCHC: 35.5 g/dL (ref 30.0–36.0)
Platelets: 157 10*3/uL (ref 150–400)
RDW: 13 % (ref 11.5–15.5)

## 2010-07-12 LAB — CARDIAC PANEL(CRET KIN+CKTOT+MB+TROPI)
CK, MB: 0.5 ng/mL (ref 0.3–4.0)
Total CK: 36 U/L (ref 7–232)

## 2010-07-12 LAB — DIFFERENTIAL
Basophils Absolute: 0.1 10*3/uL (ref 0.0–0.1)
Basophils Relative: 1 % (ref 0–1)
Eosinophils Relative: 6 % — ABNORMAL HIGH (ref 0–5)
Monocytes Absolute: 0.7 10*3/uL (ref 0.1–1.0)
Neutro Abs: 4.1 10*3/uL (ref 1.7–7.7)

## 2010-08-20 NOTE — Letter (Signed)
December 18, 2006    Kingsley Callander. Ouida Sills, MD  [address]  [place], [place] [address]   RE:  Dennis [name], Dennis [name]  MRN:  [id number]  /  DOB:  1950-10-01   Dear Channing Mutters:   Dennis [name] returns to the office at your request.  As you know, I saw  him some years ago for management of coronary artery disease and  cardiovascular risk factors.  In general he has done quite well since  suffering a myocardial infarction approximately 10 years ago.  He was  transferred to [facility], where he was treated conservatively.  He generally has no chest discomfort.  He has rare episodes of dyspnea  that are unpredictable and mild and require variable amounts of  exertion.  Generally he can walk a number of miles without any  difficulty.  He has carried nitroglycerin for years but did not use any  until approximately a month ago when he took 4 tablets and suffered a  brief syncopal spell. He was evaluated in the emergency department where  laboratory studies were negative and he was discharged to home.  He  continues to complain of intermittent episodes of malaise but these are  infrequent and mild.   PAST MEDICAL HISTORY:  1. Notable for a prostate biopsy, presumably negative 3 years ago.  2. He has mild hypertension that is generally well controlled in your      office.   He has no known allergies.   CURRENT MEDICATIONS:  1. Aspirin 325 mg daily.  2. Folate 1 mg daily.  3. Verapamil 180 mg daily.  4. Atorvastatin 10 mg daily.  5. Prilosec 20 mg daily.   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  Married with no children.  No use of tobacco nor alcohol.   FAMILY HISTORY:  Father died at age 75 due to myocardial infarction.  Mother is still alive at age 89.  He has 3 siblings without coronary  disease.   REVIEW OF SYSTEMS:  Notable for need for corrective lenses for near  vision, even following LASIK surgery.  He has GERD symptoms.  He has  urinary frequency.   He has some arthritic discomfort in his back.  All  other systems reviewed and are negative.   PHYSICAL EXAMINATION:  GENERAL:  A pleasant, somewhat overweight  gentleman in no acute distress.  VITAL SIGNS:  The weight is 187.  Blood pressure 145/90, heart rate 65  and regular, respirations 18.  HEENT:  Anicteric sclerae; EOMs full; normal oral mucosa.  NECK:  No jugular venous distention; no carotid bruits.  ENDOCRINE:  No thyromegaly.  HEMAPOIETIC:  No adenopathy.  SKIN:  No significant lesions.  LUNGS:  Clear.  CARDIAC:  Normal 1st and 2nd heart sounds; grade 1/6 basilar systolic  murmur.  ABDOMEN:  Soft and nontender; normal bowel sounds; no organomegaly.  EXTREMITIES:  Distal pulses intact; no edema.  NEUROMUSCULAR:  Symmetric strength and tone; normal cranial nerves.   EKG:  Normal sinus rhythm; delayed R wave progression; nondiagnostic  inferior Q waves with J point elevation.   The patient reports lipid profiles have been good in the past; none are  currently available for review.   IMPRESSION:  Dennis [name] is doing generally well.  His syncopal episode  is consistent with hypotension related to sublingual nitroglycerin.   I explained that he need take nitroglycerin only for chest discomfort  and that he need not carry it continuously.  We will check a lipid  profile.  He probably does not need folate.  He probably  would benefit from an increase in his dose of Statin.  I will transmit  those recommendations after I see his laboratory and plan to reassess  this nice gentleman in 6 months and then annually thereafter.  I do not  think he needs an echocardiogram or stress test for his current vague  and intermittent symptoms.    Sincerely,      Gerrit Friends. Dietrich Pates, MD, [facility]  Electronically Signed    RMR/MedQ  DD: 12/18/2006  DT: 12/18/2006  Job #: [id number]

## 2010-08-20 NOTE — Letter (Signed)
July 02, 2007    Kingsley Callander. Ouida Sills, MD  [address]  [place], [place] [address]   RE:  Dennis [name], Dennis [name]  MRN:  [id number]  /  DOB:  1951-03-25   Dear Channing Mutters:   Dennis [name] returns to the office just a few days after undergoing  three vessel stenting for progression of coronary disease.  He has done  well since hospital discharge with just a vague stinging sensation in  the upper chest and some soreness over the left lower rib cage.  He has  been walking without difficulty.  He has had no problems with his  catheterization site.   CURRENT MEDICATIONS:  1. Include aspirin 325 mg daily.  2. Folate 1 mg daily.  3. Verapamil 180 mg daily.  4. Clopidogrel 75 mg daily.  5. Simvastatin 40 mg daily.  6. Multivitamin.   PHYSICAL EXAMINATION:  GENERAL:  On exam, a pleasant trim gentleman in  no acute distress.  VITAL SIGNS:  The weight is 181, 6 pounds less than at his last visit 6  months ago.  Blood pressure 120/90, heart rate 64 and regular,  respirations 17.  NECK:  No jugular venous distention; no carotid bruits.  LUNGS:  Clear.  CARDIOVASCULAR:  Normal first and second heart sounds; fourth heart  sound and modest systolic ejection murmur present.  ABDOMEN:  Soft and nontender; no organomegaly.  EXTREMITIES:  Distal pulses intact; no edema.   IMPRESSION:  Dennis [name] is doing well following multivessel  intervention for coronary disease.  Blood pressure control is marginal.  He will obtain readings at home and return in 1 month to review these  with the cardiology nurse.  His usual lipid lowering agent,  atorvastatin, was inadvertently discontinued when he was discharged from  the hospital.  Once he uses the simvastatin that he is currently taking  he will resume atorvastatin at a higher dose of 20 mg daily.  A lipid  profile will be obtained in 6 months when he returns to see me again.  We will adjust his antihypertensive medications upwards if required in  the  interim.    Sincerely,      Gerrit Friends. Dietrich Pates, MD, [facility]  Electronically Signed    RMR/MedQ  DD: 07/02/2007  DT: 07/03/2007  Job #: [id number]

## 2010-08-20 NOTE — Discharge Summary (Signed)
NAME:  Gappa, Tramell             ACCOUNT NO.:  [id number]   MEDICAL RECORD NO.:  [id number]          PATIENT TYPE:  INP   LOCATION:  6532                         FACILITY:  [facility]   PHYSICIAN:  Thomas C. Wall, MD, FACCDATE OF BIRTH:  04/03/1951   DATE OF ADMISSION:  06/25/2007  DATE OF DISCHARGE:  06/29/2007                               DISCHARGE SUMMARY   ADDENDUM   The patient is not discharged home on Lopressor as this was discontinued  at the time of discharge.  Please disregard Lopressor on medication  list.      Bettey Mare. Lyman Bishop, NP      Jesse Sans. Daleen Squibb, MD, [facility]  Electronically Signed    KML/MEDQ  D:  06/29/2007  T:  06/30/2007  Job:  [id number]

## 2010-08-20 NOTE — H&P (Signed)
NAME:  Dennis [name], Dennis [name]             ACCOUNT NO.:  [id number]   MEDICAL RECORD NO.:  [id number]          PATIENT TYPE:  INP   LOCATION:  2002                         FACILITY:  [facility]   PHYSICIAN:  Brayton El, MD    DATE OF BIRTH:  06-Jul-1950   DATE OF ADMISSION:  12/23/2007  DATE OF DISCHARGE:                              HISTORY & PHYSICAL   CHIEF COMPLAINT:  Chest pain.   HISTORY OF PRESENT ILLNESS:  Dennis [name] as a 60 year old white male  with past medical history significant for coronary artery disease status  post PCI x3 in March of this year presenting with 4-day history of  intermittent chest pain.  The patient states that he has been doing very  well since his coronary interventions in March.  Approximately 4 days  ago he began to have intermittent substernal chest pain that he  describes as sometimes sharp and sometimes like pins and needles.  There  are no associated symptoms.  There is no radiation of this pain.  It is  unlike any sensation he has experienced before in his chest.  When it  comes on, it lasts anywhere from 5-15 minutes.  Is not brought on by  exertion nor is it relieved by rest.  Other that this patient has been  in his normal state of health.  He is compliant with all his medications  including aspirin and Plavix.   PAST MEDICAL HISTORY:  1. Coronary disease.  His heart catheterization on June 28, 2007      showed a proximal LAD lesions of 50% and 80%.  At that time he had      a Promus stent placed to the 80% lesion with 0% residuals.  He had      an 80% D1.  At that time he had PTCA to 0%.  He had mid right      coronary artery lesion 90% which he had a PLATINUM DES stent placed      with 0% residuals.  The patient also has hypertension, GERD, and      dyslipidemia.   SOCIAL HISTORY:  The patient denies any tobacco, alcohol or drug use.   FAMILY HISTORY:  Is negative for premature coronary disease although his  father died age 57 of myocardial  infarction.   ALLERGIES:  NO KNOWN DRUG ALLERGIES.   MEDICATIONS:  1. Aspirin 81 mg daily.  2. Plavix 75 mg daily.  3. Verapamil 180 grams daily.  4. Folic acid.  5. Multivitamin.  6. Lipitor 10 mg daily.   REVIEW OF SYSTEMS:  As in HPI.  In addition, the patient occasionally  has some dyspnea on exertion, however this has not changed recently.  All other systems were reviewed and are negative.   PHYSICAL EXAMINATION:  VITAL SIGNS:  His temperature is 98.2, pulse 66,  respirations 19, blood pressure 144/89.  GENERAL:  He is in no acute distress.  HEENT: Normocephalic, atraumatic.  NECK:  Supple without JVD.  HEART:  Regular rate and rhythm without murmur, rub or gallop.  LUNGS:  Clear bilaterally.  ABDOMEN:  Soft, nontender,  nondistended.  EXTREMITIES:  Without edema.  NEUROLOGY:  Nonfocal.  SKIN:  Warm and dry.  PSYCHIATRIC:  The patient is appropriate with normal levels of insight.   RADIOLOGY:  Chest x-ray shows no acute process.   LABS:  Sodium 138, potassium 4.1, chloride 109, CO2 25, BUN 13,  creatinine 0.92, glucose 99.  White count 7.5, hemoglobin 14, hematocrit  42, platelet count 148.  Cardiac enzymes; his first MB was is less than  1.0 and troponin was 0.05.  His second MB was less than 1.0 and troponin  was 0.05.  Calcium 9.0.  EKG apparently reviewed by myself demonstrates  normal sinus rhythm without any ST or T-wave abnormalities.  There is a  mildly delayed R-wave progression in the anterior leads and a possible  old inferior myocardial infarction.   ASSESSMENT:  A 61 year old white male with known coronary disease status  post multiple interventions in March of this year presenting with  atypical chest pain.  His EKG is without ischemic changes and cardiac  enzymes and troponin are completely within normal limits.   PLAN:  We will admit him to telemetry unit and rule him out for  myocardial infarction.  We will continue his home medications except for   the time being we will increase aspirin from 81 mg to 325 mg daily.  He  will be made n.p.o. for a probable stress test in the morning.      Brayton El, MD  Electronically Signed     SGA/MEDQ  D:  12/23/2007  T:  12/24/2007  Job:  [id number]

## 2010-08-20 NOTE — Discharge Summary (Signed)
NAME:  Mcwethy, Ananda             ACCOUNT NO.:  [id number]   MEDICAL RECORD NO.:  [id number]          PATIENT TYPE:  INP   LOCATION:  6532                         FACILITY:  [facility]   PHYSICIAN:  Thomas C. Wall, MD, FACCDATE OF BIRTH:  03-Oct-1950   DATE OF ADMISSION:  06/25/2007  DATE OF DISCHARGE:  06/29/2007                               DISCHARGE SUMMARY   PRIMARY CARDIOLOGIST:  Gerrit Friends. Dietrich Pates, MD, [facility]   PRIMARY CARE PHYSICIAN:  Kingsley Callander. Ouida Sills, M.D.   PROCEDURES PERFORMED DURING HOSPITALIZATION:  Cardiac catheterization  completed by Dr. Charlies Constable on June 28, 2007 revealing:  A.  Proximal LAD and 80% diagonal, circumflex was normal, right coronary  artery 90% proximal.  B.  Status post PCI with Promus drug-eluting stent to LAD and diagonal  and right coronary artery.  C.  Platinum research study participant.   FINAL DISCHARGE DIAGNOSES:  1. Unstable angina.  2. Coronary artery disease.  Status post cardiac catheterization with      three-vessel coronary artery disease.      a.     Status post percutaneous coronary intervention with Promus       drug-eluting stent to left anterior descending, right coronary,       and diagonal.  3. Hypertension.  4. Dyslipidemia.  5. Gastroesophageal reflux disease.   HOSPITAL COURSE:  This is a 60 year old Caucasian male with a history of  nonobstructive coronary artery disease, status post MI, and cardiac  catheterization in 1998 done at [facility] with complaint of chest  discomfort.  The patient was lost to follow up since 2000 after he had a  followup catheterization  for recurrent chest pain at that time.  The  patient had not been keeping his appointments in [place].  He  underwent a stress Myoview in 2006 with an EF of 62% without ischemia.  The patient had an episode of chest discomfort 2 days prior to admission  as he describes as a dull ache or so on the right side of the chest with  associated shortness of  breath, nausea, and the same sensation between  his shoulder blades at rest.  The patient was seen and evaluated at  [facility].  A 12-lead EKG showed no acute changes, but he did  have Q-waves in the inferior lead.  The patient was transported to [facility] for further cardiac workup.   On arrival, the patient was seen and examined by Dr. Valera Castle.  The  patient was scheduled for cardiac catheterization.  Cardiac enzymes were  cycled and found to be negative.  He was kept on heparin until  catheterization was planned along with enteric-coated aspirin.   The patient did undergo cardiac catheterization with no complaints.  The  patient tolerated procedure well and underwent PCI with drug-eluting  stent placement to the LAD, right coronary artery and diagonal.  The  patient recovered without any evidence of bleeding, hematoma, or signs  of infection.   On the day of discharge, the patient was seen and examined by Dr. Valera Castle and found to be stable  for discharge with followup to his primary  care physician, Dr. Ouida Sills and to Dr. Dietrich Pates in [place] within 1  week for a followup BMET.   PHYSICAL EXAMINATION:  Vital signs on discharge, blood pressure 116/72,  heart rate 65.   Telemetry revealed normal sinus rhythm.   Hemoglobin 13.5, potassium 3.9, creatinine 0.86, troponin negative x3.  CK-MB was negative.  Total cholesterol 130, LDL 79, HDL 31.  Chest x-ray  was normal.  EKG revealing normal sinus rhythm with ventricular rate of  63 beats per minute.   DISCHARGE MEDICATIONS:  1. Plavix 75 mg daily.  2. Aspirin 81 mg daily.  3. Lopressor 12.5 mg twice a day.  4. Verapamil 180 mg daily.  5. Simvastatin 40 mg daily.  6. Folic acid at bedtime.   ALLERGIES:  NO KNOWN DRUG ALLERGIES.   FOLLOWUP PLANS AND APPOINTMENTS:  1. The patient is scheduled to see Dr. Cheney Bing on a previously      scheduled appointment on Friday, July 02, 2007, at 1:45  p.m.  2. The patient is to follow up with Dr. Ouida Sills, primary care physician      on his own accord for continued medical management.  3. The patient was given post cardiac catheterization instructions      with particular emphasis on the right groin site for evidence of      bleeding, hematoma, or infection.  4. The patient has been advised on a low-sodium diet.  5. The patient has been given prescriptions for Plavix and Lopressor      with the need to continue Plavix and aspirin x1 year.   Time spent with the patient to include physician time 30 minutes.      Bettey Mare. Lyman Bishop, NP      Jesse Sans. Daleen Squibb, MD, [facility]  Electronically Signed    KML/MEDQ  D:  06/29/2007  T:  06/30/2007  Job:  [id number]

## 2010-08-20 NOTE — Assessment & Plan Note (Signed)
[facility]                       [place] CARDIOLOGY OFFICE NOTE   NAME:Dennis [name], Dennis [name]                    MRN:          [id number]  DATE:04/18/2008                            DOB:          1950-06-11    CARDIOLOGIST:  Gerrit Friends. Dietrich Pates, MD, [facility].   PRIMARY CARE PHYSICIAN:  Kingsley Callander. Ouida Sills, MD   REASON FOR VISIT:  A 40-month followup.   HISTORY OF PRESENT ILLNESS:  Dennis [name] is a 60 year old male patient  with a history of coronary artery disease status post multivessel  stenting in March 2009 who presents to the office today for followup.  He is doing well overall.  He denies any chest pain, shortness of  breath, syncope, or near syncope.  He denies any orthopnea, PND, or  pedal edema.  He does note some tingling in his fingertips of his left  hand.  He has some pain in that hand as well when he wakes up in the  morning.   CURRENT MEDICATIONS:  1. Folic acid 1 mg daily.  2. Verapamil 180 mg daily.  3. Plavix 75 mg daily.  4. Multivitamin daily.  5. Aspirin 81 mg daily.  6. Lipitor 20 mg daily.  7. Ranitidine 150 mg b.i.d.  8. Nitroglycerin p.r.n. for chest pain.   PHYSICAL EXAMINATION:  GENERAL:  He is a well nourished and well  developed male, in no distress.  VITAL SIGNS:  Blood pressure is 100/70, pulse 68, and weight 186 pounds.  HEENT:  Normal.  NECK:  Without JVD.  CARDIAC:  Normal S1 and S2.  Regular rate and rhythm.  LUNGS:  Clear to auscultation bilaterally.  ABDOMEN:  Soft and nontender.  EXTREMITIES:  Without edema.  NEUROLOGIC:  He is alert and oriented x3.  Cranial nerves II through XII  grossly intact.  VASCULAR:  Without carotid bruits bilaterally.  MUSCULOSKELETAL:  Tinel's and Phalen's test are both negative.  He does  have atrophy of the hypothenar eminence on the left.   ASSESSMENT AND PLAN:  1. Coronary artery disease status post multivessel stenting to the      left anterior descending (coronary artery),  right coronary artery,      and angioplasty to the diagonal in March 2009.  He had a drug-      eluting stent placed in both left anterior descending (coronary      artery) and right coronary artery.  He has a history of overall      preserved left ventricular function.  He is doing well without      chest pain or shortness of breath.  He knows the importance of      continuing aspirin and Plavix.  No further workup is planned at      this time.  2. Dyslipidemia.  This is followed by Dr. Ouida Sills.  His goal LDL is less      than or equal to 70.  3. Left hand paresthesias.  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  I have asked him to obtain a wrist splint over-the-  counter and follow up Dr. Ouida Sills for further recommendations, if      any.   DISPOSITION:  Followup in 1 year or sooner p.r.n.      [name], PA-C  Electronically Signed      Gerrit Friends. Dietrich Pates, MD, [facility]  Electronically Signed   SW/MedQ  DD: 04/18/2008  DT: 04/19/2008  Job #: [id number]   cc:   Kingsley Callander. Ouida Sills, MD

## 2010-08-20 NOTE — Assessment & Plan Note (Signed)
[facility]                       [place] CARDIOLOGY OFFICE NOTE   NAME:Dennis [name], Dennis [name]                    MRN:          [id number]  DATE:01/03/2008                            DOB:          [birth year]    CARDIOLOGIST:  Gerrit Friends. Dietrich Pates, MD, [facility]   PRIMARY CARE PHYSICIAN:  Kingsley Callander. Ouida Sills, MD   REASON FOR VISIT:  Post hospitalization followup.   HISTORY OF PRESENT ILLNESS:  [name] is a 60 year old male patient  with a history of coronary artery disease status post multivessel  stenting in March 2009, with placement of a Promus drug-eluting stent to  the LAD and angioplasty to the diagonal and Platinum drug-eluting stent  to the proximal/mid RCA.  He has overall preserved LV function with an  EF of 55% by echocardiogram in March 2009.  He recently was admitted to  [facility] with chest discomfort.  His cardiac enzymes were  negative.  The discharge records indicate that he had a Myoview study  performed prior to discharge that revealed no perfusion defects.  He  returns for follow-up today.  He denies recurrent chest discomfort.  Denies any exertional chest heaviness or tightness, shortness of breath.  Denies any orthopnea, PND, or pedal edema.  Denies any palpitations,  syncope, near syncope.   CURRENT MEDICATIONS:  Folic acid, Verapamil 180 mg daily, Plavix 75 mg  daily, Multivitamin, Aspirin 81 mg daily, Lipitor 10 mg daily,  Ranitidine 150 mg b.i.d., Nitroglycerin p.r.n. chest pain.   PHYSICAL EXAMINATION:  GENERAL:  He is a well-nourished, well-developed  male, in no acute distress.  VITAL SIGNS:  Blood pressure is 118/90, repeat blood pressure by me is  120/80, pulse 72, weight 182 pounds.  HEENT:  Normal.  NECK:  Without JVD.  CARDIAC:  Normal S1 and S2, regular rate and rhythm without murmur.  LUNGS:  Clear to auscultation bilaterally, soft, and nontender.  Extremities:  Without edema.  SKIN:  Warm.  NEUROLOGIC:  He  is alert and oriented x3.  Cranial nerves II through XII  were grossly intact.  VASCULAR:  Without carotid bruits bilaterally.   ASSESSMENT AND PLAN:  1. Recent episode of chest pain in a 60 year old male patient with      history of coronary artery disease status post stenting to the LAD      and RCA, and angioplasty to the diagonal as outlined above in March      2009 with overall preserved left ventricular function.  He had a      nuclear scan that showed no perfusion defects.  The official report      is not available at this time.  The patient has not had any      recurrent chest pain and requires no further cardiac workup at this      time.  He is on a good medical regimen as outlined above.  2. Dyslipidemia.  The patient has his lipids followed by Dr. Ouida Sills.      His goal LDL is less than or equal to 70.   DISPOSITION:  The patient will be brought back in  follow-up with Dr.  Dietrich Pates in the next 4 months or sooner p.r.n.  We will try to obtain  the official report on his stress test for our chart.      [name], PA-C  Electronically Signed      Gerrit Friends. Dietrich Pates, MD, [facility]  Electronically Signed   SW/MedQ  DD: 01/03/2008  DT: 01/04/2008  Job #: [id number]   cc:   Kingsley Callander. Ouida Sills, MD

## 2010-08-20 NOTE — H&P (Signed)
NAME:  Dennis [name], GUEYE             ACCOUNT NO.:  [id number]   MEDICAL RECORD NO.:  [id number]           PATIENT TYPE:   LOCATION:                                 FACILITY:   PHYSICIAN:  Thomas C. Wall, MD, FACCDATE OF BIRTH:  07-02-1950   DATE OF ADMISSION:  DATE OF DISCHARGE:                              HISTORY & PHYSICAL   PRIMARY CARDIOLOGIST:  Gerrit Friends. Dietrich Pates, MD.   PRIMARY CARE:  Kingsley Callander. Ouida Sills, MD   The patient is being admitted by [facility] C. Wall, MD.   HISTORY:  [name] is a very pleasant 60 year old Caucasian  gentleman with known history of nonobstructive coronary artery disease  status post MI and cardiac catheterization in 1998 done at [facility].  The patient states he was told he had disease 30% and 40%  blockages, at that time, and was given medicine for treatment.  He  reports an additional cardiac catheterization done either later that  year in early 1999 or in 2000 for a similar episode of chest discomfort.  The patient reports that he did not have a heart attack at that time,  and cardiac catheterization had not changed.  I do not have any of these  reports available at this time.   He was sort of lost to followup since that time, and has not kept his  appointments in [place].  He apparently underwent a stress Myoview in  2006 with an EF of 62% without ischemia.  He had an episode of what was  called vertigo, back last fall, and apparently the patient had taken  some nitroglycerin for some chest discomfort, and states that he got  real lightheaded and dizzy.  He has not taken any nitroglycerin since  that time, but has done quite well.  He continues to work.  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  He states that it is not unusual for him to work 8-10 hours a  day; not extremely heavy physical exertion, but just he keeps busy.  He  states compliance with his medications.   For the last 2  days he has had some of chest discomfort.  He describes  it as just a dull ache more so on the right side of his chest, but it  has been associated with some shortness of breath and nausea.  He also  has had the same sensation through to his back between his shoulder  blades.  Today it seemed a little worse than usual.  He states it was a  3 on a scale of 1-10 usually around the 2 for him; however, he  experienced some numbness and discomfort in his left arm.  This  concerned him, so he decided to get evaluated and went to [facility].  He is currently pain free.   A 12-lead EKG done at [facility] showed no acute findings.  He has Q  waves in inferior leads.  He was given aspirin, and an IV was initiated  there.  It was decided that the patient needed further evaluation.  He  agreed be transported to [facility] for workup.  He also had a chest x-  ray and baseline blood work obtained at [facility] that showed first set  of point of cares markers negative.   ALLERGIES:  No known drug allergies.   MEDICATIONS INCLUDE:  1. Aspirin 325.  2. Verapamil 180.  3. Lipitor 10.  4. Folic acid.   PAST MEDICAL HISTORY:  1. Notable for a myocardial infarction in 1998.  2. Cardiac catheterization at [facility] with reports of      nonobstructive disease.  3. A followup catheterization within the next 1-2 years done here,      reports not available at this time.  4. Hypertension.  5. GERD.  6. Dyslipidemia.  7. A stress Myoview in 2006 with an EF of 62%, no ischemia.   SOCIAL HISTORY:  The patient lives in [place], [place], with his  spouse.  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  He denies any tobacco, drugs, herbal  medication, or alcohol use.  No diet restrictions.   FAMILY HISTORY:  Mother's side of the family without known CAD.  Father  deceased at age 85 from MI.  The patient has 3 brothers with no known   coronary artery disease.   REVIEW OF SYSTEMS:  Positive for sweats over the last 2 days.  Chest  pain, dyspnea on exertion as described above.  Occasional nausea as  described above, and chronic GERD symptoms.   PHYSICAL EXAM:  VITAL SIGNS:  The patient is afebrile, heart rate 76,  blood pressure 132/88 with respirations of 18.  GENERAL:  He is in no acute distress.  HEENT:  Normal.  NECK:  Supple without lymphadenopathy, no bruits.  No JVD.  CARDIOVASCULAR:  Exam reveals an S1-S2 with regular rate and rhythm.  LUNGS:  Clear to auscultation throughout.  SKIN:  Warm and dry.  ABDOMEN:  Soft, nontender, positive bowel sounds.  EXTREMITIES:  Lower Extremities without clubbing, cyanosis, or edema.  NEUROLOGICALLY:  Alert and oriented x3.   CHEST X-RAY:  Done at [facility] showed poor inspiration cardiomegaly,  and central pulmonary vascular prominence without infiltrates or CHF.  Torturous aorta,. questionable opacities versus shadowing.  Recommended  a PA and lateral chest x-ray.   EKG:  Done at [facility] showing sinus rhythm with old Q waves inferior  leads at a rate of 74 without acute ST or T wave changes.   LAB WORK:  Note this lab work is done at [facility].  First  point of care markers negative.  H&H 16 and 44.7, WBC 7.9, platelets  170,000.  Sodium 139, potassium 4.1, chloride 106, CO2 26, BUN 11,  creatinine 0.88, glucose 106.   IMPRESSION:  1. Chest pain with history of nonobstructive coronary artery disease.      Previous cath greater than 5 years ago.  Recommend cardiac      catheterization for reevaluation of coronary anatomy.  2. Hypertension controlled at this time.  3. Dyslipidemia.  Check fasting lipid panel.  4. Gastroesophageal reflux disease, proton pump inhibitor.  5. Will also check a hemoglobin A1c as the patient's glucose is mildly      elevated.   Dr. [name] in to examine and assess the patient, and agrees with plan.  The patient scheduled  for cardiac catheterization on Monday.  The  risks  and benefits of cardiac catheterization have been discussed with the  patient.  The patient agrees to proceed.      Dorian Pod, ACNP      Jesse Sans. Daleen Squibb, MD, [facility]  Electronically Signed    MB/MEDQ  D:  06/25/2007  T:  06/25/2007  Job:  [id number]

## 2010-08-20 NOTE — Cardiovascular Report (Signed)
NAME:  Dennis [name], Dennis [name]             ACCOUNT NO.:  [id number]   MEDICAL RECORD NO.:  [id number]          PATIENT TYPE:  INP   LOCATION:  6532                         FACILITY:  [facility]   PHYSICIAN:  Dennis R. Juanda Chance, MD, FACCDATE OF BIRTH:  08-31-1950   DATE OF PROCEDURE:  06/28/2007  DATE OF DISCHARGE:                            CARDIAC CATHETERIZATION   CLINICAL HISTORY:  Dennis [name] is 60 years old and has a history of  nonobstructive coronary disease by catheterization a number of years  ago.  He presented to the hospital with chest pain consistent with  unstable angina.  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   PROCEDURE:  The procedure was performed via the right femoral artery  using an arterial sheath and 5-French preformed coronary catheters.  After completion of the diagnostic study, we made a decision to proceed  with intervention on the bifurcation lesion in the LAD diagonal branch  and the lesion in the proximal right coronary.   The patient had been previously given chewable aspirin and was given 600  mg of Plavix and 20 mg of Pepcid.  He was given Angiomax bolus and  infusion.   We first approached the LAD diagonal bifurcation lesion.  We used a Q4 7-  [place] guiding catheter with side holes and two Prowater wires.  We  passed the wires down the diagonal branch and the LAD.  We dilated the  ostial lesion in the diagonal branch with a 2 x 10 mm cutting balloon,  performing three inflations up to 8 atmospheres for 20 seconds.  We then  dilated the same lesion with a 2.25 x 12 mm Maverick, performing one  inflation up to 8 atmospheres for 30 seconds.  We then predilated the  lesion in the LAD with a 2.5 x 20 mm Maverick, performing one inflation  up to 8 atmospheres for 20 seconds.  We then deployed a 2.75 x 28 mm  Promus stent, deploying this with one inflation up to 12 atmospheres for  30 seconds.  We then postdilated with a 3.5 x 15 mm [place]  Voyager,  performing two inflations up to 14 atmospheres for 30 seconds.  There  was a change in caliber from the proximal reference to the distal  reference from about 3.5 down to 2.75.  This pinched the diagonal branch  to about 50% but did not compromise flow, and we did not redilate the  ostium of the diagonal branch.   We then approached the lesion in the proximal to mid-right coronary  artery.  We used a 6-French JR-4 guiding catheter with side holes and a  Prowater wire.  We crossed the lesion with the wire without difficulty.  We predilated with a 2.5 x 15 mm Maverick, performing one inflation up  to 8 atmospheres for 30 seconds.  We then deployed a 2.5 x 20 mm  PLATINUM stent which was either a Promus Element or a Promus stent as  part of the PLATINUM trial.  We deployed this with one inflation of 15  atmospheres for 30 seconds.  We then postdilated  with a 3 x 15 mm  Quantum Maverick, performing two inflation up to 15 atmospheres for 30  seconds.  Final diagnostic study was then performed through the guiding  catheter.  The patient tolerated the procedure well and left the  laboratory in satisfactory condition.   RESULTS:  The left main coronary.  The left main coronary was free of  significant disease.   The left anterior descending artery.  The left anterior descending  artery gave rise to a diagonal branch and septal perforator.  There were  50% and 80% stenoses in the proximal LAD, with segmental disease  overlapping the diagonal branch.  There was 80% ostial stenosis in the  diagonal branch, which represented a bifurcation lesion.   The circumflex artery.  The circumflex artery gave rise to three  posterolateral branches.  These vessels were free of significant  disease.   The right coronary artery.  The right coronary artery was a moderate-  sized vessel that gave rise to a right ventricular branch, a posterior  descending branch, and a small posterolateral branch.   There was 90%  stenosis in the proximal to midvessel, with some segmental disease.   The left ventriculogram.  The left ventriculogram performed in the RAO  projection showed good wall motion.  The estimated ejection fraction was  60%.  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   Following stenting of the lesion in the LAD, the stenosis improved from  80% to 0%.   Following PTCA of the diagonal branch, the stenosis improved from 80% to  a residual 50% following stent deployment in the LAD.  This was treated  with angioplasty only.   The right coronary artery lesion.  The right coronary artery lesion  improved from 90% to 0% using a PLATINUM drug-eluting stent.   CONCLUSION:  1. Coronary artery disease, with 80% stenosis in the proximal LAD, 80%      ostial stenosis in the first diagonal branch, no significant      obstruction in the circumflex artery, and 90% stenosis in the      proximal to midright coronary artery, with normal LV function.  2. Successful PCI to the bifurcation lesion in the LAD diagonal      branch, with improvement in the LAD stenosis from 80% to 0% using a      Promus drug-eluting stent, and improvement in the diagonal lesion      from 80% to 50% using angioplasty alone.  3. Successful PCI of the lesion in the proximal to midright coronary      artery using a PLATINUM drug-eluting stent, with improvement in the      central narrowing from 90% to 0%.   DISPOSITION:  The patient was returned to the unit for further  observation.      Dennis Elvera Lennox Juanda Chance, MD, [facility]  Electronically Signed     BRB/MEDQ  D:  06/28/2007  T:  06/29/2007  Job:  [id number]   cc:   Dennis Callander. Ouida Sills, MD  Dennis Friends. Dietrich Pates, MD, [facility]  Cardiopulmonary Laboratory

## 2010-08-20 NOTE — Discharge Summary (Signed)
NAME:  Dennis, [name]             ACCOUNT NO.:  [id number]   MEDICAL RECORD NO.:  [id number]          PATIENT TYPE:  INP   LOCATION:  2002                         FACILITY:  [facility]   PHYSICIAN:  Veverly Fells. Excell Seltzer, MD  DATE OF BIRTH:  03-12-1951   DATE OF ADMISSION:  12/23/2007  DATE OF DISCHARGE:  12/24/2007                               DISCHARGE SUMMARY   PRIMARY CARDIOLOGIST:  Gerrit Friends. Dietrich Pates, MD, [facility], [facility], [facility]   PRIMARY CARE PHYSICIAN:  Not listed.   PROCEDURES PERFORMED DURING HOSPITALIZATION:  Nuclear medicine stress  Myoview read by Dr. Dietrich Pates, revealing no perfusion deficits noted.   FINAL DISCHARGE DIAGNOSES:  1. Chest pain.  2. Known coronary artery disease with percutaneous coronary      intervention x3 in 2009 to the left anterior descending,      posterolateral, and right coronary artery.  3. Hypertension.  4. Gastroesophageal reflux disease.  5. Dyslipidemia.   HOSPITAL COURSE:  This is a 60 year old Caucasian male with known  history of coronary artery disease status post PCI and stent to the LAD,  posterolateral, and right coronary artery in March 2009, who presented  with 4-day history of intermittent, sharp chest pain without other  associated symptoms.  The patient was seen and examined by the  Cardiology fellow for Dr. Tonny Bollman during admission and was  watched under observation for recurrence of ischemia.  Cardiac enzymes  were cycled and found to be negative.  The patient underwent nuclear  medicine Cardiolite stress test to evaluate for recurrence of ischemia  and was found to be negative.  The patient was chest painfree throughout  hospitalization and was seen and examined by Dr. Tonny Bollman on day  of discharge and found to be stable.  The patient was anxious to return  home and will follow up with Dr. Dietrich Pates in [place] in 2 weeks post  discharge.   DISCHARGE LABORATORY DATA:  Troponin is negative x3.  Sodium 138,  potassium 4.1, chloride  109, CO2 of 25, BUN 13, creatinine 0.90, and  glucose 99.  Hemoglobin 14, hematocrit 47, white blood cells 7.5, and  platelets 148.  Chest x-ray revealing no acute process.  EKG revealing  normal sinus rhythm with evidence of inferior infarct, otherwise  negative.   Blood pressure on discharge 117/78, heart rate 60, respirations 20,  temperature 97.7, and O2 saturation 98% on room air.   DISCHARGE MEDICATIONS:  1. Plavix 75 mg daily.  2. Lipitor 20 mg daily.  3. Folic acid 1 mg daily.  4. Verapamil 180 mg daily.  5. Aspirin 81 mg daily.  6. Ranitidine twice a day.   ALLERGIES:  No known drug allergies.   FOLLOWUP PLANS AND APPOINTMENTS:  1. The patient is to follow up with Dr. Hutchins Bing on January 07, 2008, at 10:30 a.m. for continued cardiac management.  2. The patient is to follow with Dr. Ouida Sills, primary care physician, as      needed.   Time spent with the patient to include physician time 30 minutes.      Bettey Mare.  Lyman Bishop, NP      Veverly Fells. Excell Seltzer, MD  Electronically Signed    KML/MEDQ  D:  12/24/2007  T:  12/25/2007  Job:  [id number]   cc:   Kingsley Callander. Ouida Sills, MD

## 2010-08-23 NOTE — Op Note (Signed)
[facility]  Patient:    Dennis [name], Dennis [name] Visit Number: [id number] MRN: [id number]          Service Type: END Location: DAY Attending Physician:  Jonathon Bellows Dictated by:   Roetta Sessions, M.D. Proc. Date: 06/22/01 Admit Date:  06/22/2001   CC:         Carylon Perches, M.D.   Operative Report  PROCEDURE:  Screening colonoscopy.  INDICATIONS FOR PROCEDURE:  The patient is a 60 year old gentleman referred courtesy of Dr. Carylon Perches for colorectal cancer screening.  He has never had imaging of his lower GI tract.  He is devoid of any bowel symptoms.  There is no family history of colorectal neoplasia.  Colonoscopy is now being done as survey screening maneuver.   This approach has been discussed with Mr. Chew at the bedside.  The potential risks, benefits and alternatives have been reviewed and all questions answered.  The patient is at low risk for conscious sedation.  Please see my handwritten H&P for more information.  DESCRIPTION OF PROCEDURE:  Oxygen saturation, blood pressure, pulse and respirations were monitored throughout the entire procedure.  CONSCIOUS SEDATION:  Versed 2 mg IV, Demerol 25 mg IV in divided doses.  INSTRUMENT:  Olympus diagnostic videochip colonoscope.  FINDINGS:  Digital rectal examination revealed no abnormalities.  ENDOSCOPIC FINDINGS:  Prep was adequate.  RECTAL:  Examination of the rectal mucosa including retroflexed view  of the anal verge revealed no abnormalities.  COLON:  The colonic mucosa was surveyed from the rectosigmoid junction through the left, transverse and right colon to the appendiceal orifice, ileocecal valve and cecum.  These structures were well seen and photographed.  The patient was noted to have distal sigmoid diverticula.  The remainder of the colonic mucosa appeared normal.  From the level of the cecum and ileocecal valve, the scope was slowly withdrawn and all previously mentioned  mucosal surfaces were again seen.  Again, no other abnormalities were observed.  The patient tolerated the procedure well.  He was monitored on the stretcher after the procedure.  When he was placed in a wheelchair, he had a minor vasovagal episode and dropped his heart rate into the 40s.  He was given Atropine 0.5 mg IV.  IMPRESSION:  Normal rectum, sigmoid diverticula.  The remainder of the colonic mucosa appeared normal.  RECOMMENDATIONS: 1. Diverticulosis literature provided to Mr. McDaniels. 2. Repeat colonoscopy in 10 years. 3. The patient is to resume his aspirin. 4. Follow up with Dr. Ouida Sills. Dictated by:   Roetta Sessions, M.D. Attending Physician:  Jonathon Bellows DD:  06/22/01 TD:  06/23/01 Job: [id number]

## 2010-08-23 NOTE — Procedures (Signed)
   NAME:  Melena, ERYN KREJCI                      ACCOUNT NO.:  [id number]   MEDICAL RECORD NO.:  [id number]                  PATIENT TYPE:   LOCATION:                                       FACILITY:   PHYSICIAN:  Kingsley Callander. Ouida Sills, M.D.                  DATE OF BIRTH:   DATE OF PROCEDURE:  DATE OF DISCHARGE:                                    STRESS TEST   The patient exercised 9 minutes (completing stage III of the Bruce protocol)  attaining a maximal heart rate of 169 (100% of the age-predicted maximal  heart rate) at a work load of 10.1 METS and discontinued exercise after  completing stage III.  There were no symptoms of chest pain.  There were no  arrhythmias.  There were no ST segment changes diagnostic of ischemia.  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   IMPRESSION:  No evidence of exercise-induced ischemia.  Cardiolite images  pending.                                                         Kingsley Callander. Ouida Sills, M.D.    ROF/MEDQ  D:  12/21/2001  T:  12/21/2001  Job:  [id number]

## 2010-08-23 NOTE — Procedures (Signed)
NAME:  Dennis [name], Dennis [name]             ACCOUNT NO.:  [id number]   MEDICAL RECORD NO.:  [id number]          PATIENT TYPE:  REC   LOCATION:  RAD                           FACILITY:  [facility]   PHYSICIAN:  Kingsley Callander. Ouida Sills, MD       DATE OF BIRTH:  05-22-1950   DATE OF PROCEDURE:  04/30/2004  DATE OF DISCHARGE:                                    STRESS TEST   PROCEDURE:  Myoview stress test.   ATTENDING:  Kingsley Callander. Ouida Sills, M.D.   RESULTS:  The patient exercised 9 minutes (completing stage III of the Bruce  protocol), obtaining a maximal heart rate of 166 (100% of the age-predicted  maximal heart rate) at a work load of 10.1 METS and discontinued exercise  after surpassing his target heart rate.  There were no symptoms of chest  pain.  There were no arrhythmias.  The baseline EKG revealed normal sinus  rhythm with an old inferior MI.  With exercise, there were no ST segment  changes diagnostic of ischemia.   IMPRESSION:  No evidence of exercise-induced ischemia, Myoview images are  pending.      ROF/MEDQ  D:  04/30/2004  T:  04/30/2004  Job:  [id number]

## 2010-12-12 ENCOUNTER — Emergency Department (HOSPITAL_COMMUNITY): Payer: 59

## 2010-12-12 ENCOUNTER — Observation Stay (HOSPITAL_COMMUNITY)
Admission: EM | Admit: 2010-12-12 | Discharge: 2010-12-13 | Disposition: A | Discharge status: Home / Self Care | Payer: 59 | Attending: Internal Medicine | Admitting: Internal Medicine

## 2010-12-12 ENCOUNTER — Other Ambulatory Visit: Payer: Self-pay

## 2010-12-12 DIAGNOSIS — Z79899 Other long term (current) drug therapy: Secondary | ICD-10-CM | POA: Insufficient documentation

## 2010-12-12 DIAGNOSIS — Z9861 Coronary angioplasty status: Secondary | ICD-10-CM | POA: Insufficient documentation

## 2010-12-12 DIAGNOSIS — I252 Old myocardial infarction: Secondary | ICD-10-CM | POA: Insufficient documentation

## 2010-12-12 DIAGNOSIS — N400 Enlarged prostate without lower urinary tract symptoms: Secondary | ICD-10-CM | POA: Insufficient documentation

## 2010-12-12 DIAGNOSIS — R079 Chest pain, unspecified: Secondary | ICD-10-CM

## 2010-12-12 DIAGNOSIS — K219 Gastro-esophageal reflux disease without esophagitis: Secondary | ICD-10-CM | POA: Insufficient documentation

## 2010-12-12 DIAGNOSIS — E785 Hyperlipidemia, unspecified: Secondary | ICD-10-CM | POA: Insufficient documentation

## 2010-12-12 DIAGNOSIS — R0789 Other chest pain: Principal | ICD-10-CM | POA: Insufficient documentation

## 2010-12-12 DIAGNOSIS — I2510 Atherosclerotic heart disease of native coronary artery without angina pectoris: Secondary | ICD-10-CM | POA: Insufficient documentation

## 2010-12-12 HISTORY — DX: Acute myocardial infarction, unspecified: I21.9

## 2010-12-12 HISTORY — DX: Atherosclerotic heart disease of native coronary artery without angina pectoris: I25.10

## 2010-12-12 LAB — HEPATIC FUNCTION PANEL
ALT: 16 U/L (ref 0–53)
Alkaline Phosphatase: 130 U/L — ABNORMAL HIGH (ref 39–117)
Bilirubin, Direct: 0.2 mg/dL (ref 0.0–0.3)
Indirect Bilirubin: 0.7 mg/dL (ref 0.3–0.9)
Total Protein: 7.3 g/dL (ref 6.0–8.3)

## 2010-12-12 LAB — CBC
Hemoglobin: 15.6 g/dL (ref 13.0–17.0)
RBC: 4.79 MIL/uL (ref 4.22–5.81)
WBC: 9.5 10*3/uL (ref 4.0–10.5)

## 2010-12-12 LAB — BASIC METABOLIC PANEL
CO2: 25 mEq/L (ref 19–32)
Chloride: 101 mEq/L (ref 96–112)
GFR calc non Af Amer: >60 mL/min (ref >60)
Glucose, Bld: 147 mg/dL — ABNORMAL HIGH (ref 70–99)
Potassium: 3.9 mEq/L (ref 3.5–5.1)
Sodium: 138 mEq/L (ref 135–145)

## 2010-12-12 LAB — POCT I-STAT TROPONIN I

## 2010-12-12 MED ORDER — NITROGLYCERIN 2 % TD OINT
1.0000 [in_us] | TOPICAL_OINTMENT | Freq: Once | TRANSDERMAL | Status: AC
  Administered 2010-12-12: 1 [in_us] via TOPICAL
  Filled 2010-12-12: qty 1

## 2010-12-12 MED ORDER — ASPIRIN 81 MG PO CHEW
324.0000 mg | CHEWABLE_TABLET | Freq: Once | ORAL | Status: AC
  Administered 2010-12-12: 324 mg via ORAL
  Filled 2010-12-12: qty 4

## 2010-12-12 MED ORDER — ALUM & MAG HYDROXIDE-SIMETH 200-200-20 MG/5ML PO SUSP: 30.0000 mL | Freq: Four times a day (QID) | ORAL | Status: DC | PRN

## 2010-12-12 MED ORDER — VERAPAMIL HCL ER 180 MG PO TBCR: 180.0000 mg | EXTENDED_RELEASE_TABLET | Freq: Every day | ORAL | Status: DC

## 2010-12-12 MED ORDER — ENOXAPARIN SODIUM 40 MG/0.4ML ~~LOC~~ SOLN
40.0000 mg | SUBCUTANEOUS | Status: DC
  Administered 2010-12-12: 40 mg via SUBCUTANEOUS
  Filled 2010-12-12: qty 0.4

## 2010-12-12 MED ORDER — ACETAMINOPHEN 650 MG RE SUPP: 650.0000 mg | Freq: Four times a day (QID) | RECTAL | Status: DC | PRN

## 2010-12-12 MED ORDER — SODIUM CHLORIDE 0.9 % IJ SOLN
3.0000 mL | Freq: Two times a day (BID) | INTRAMUSCULAR | Status: DC
  Administered 2010-12-12: 3 mL via INTRAVENOUS
  Filled 2010-12-12: qty 3

## 2010-12-12 MED ORDER — SODIUM CHLORIDE 0.9 % IJ SOLN: 3.0000 mL | INTRAMUSCULAR | Status: DC | PRN

## 2010-12-12 MED ORDER — ONDANSETRON HCL 4 MG PO TABS: 4.0000 mg | ORAL_TABLET | Freq: Four times a day (QID) | ORAL | Status: DC | PRN

## 2010-12-12 MED ORDER — ONDANSETRON HCL 4 MG/2ML IJ SOLN: 4.0000 mg | Freq: Four times a day (QID) | INTRAMUSCULAR | Status: DC | PRN

## 2010-12-12 MED ORDER — SODIUM CHLORIDE 0.9 % IV SOLN: 250.0000 mL | INTRAVENOUS | Status: DC

## 2010-12-12 MED ORDER — ASPIRIN EC 81 MG PO TBEC: 81.0000 mg | DELAYED_RELEASE_TABLET | Freq: Every day | ORAL | Status: DC

## 2010-12-12 MED ORDER — ACETAMINOPHEN 325 MG PO TABS
650.0000 mg | ORAL_TABLET | Freq: Four times a day (QID) | ORAL | Status: DC | PRN
  Administered 2010-12-12: 650 mg via ORAL
  Filled 2010-12-12: qty 2

## 2010-12-12 NOTE — ED Provider Notes (Cosign Needed)
History   Chart scribed for Ward Givens, MD by Enos Fling; the patient was seen in room APA12/APA12; this patient's care was started at 12:43 PM.    CSN: [id number] Arrival date & time: 12/12/2010 11:52 AM  Chief Complaint  Patient presents with  . Chest Pain   HPI Dennis [name] is a 60 y.o. male who presents to the Emergency Department complaining of chest pain. Pt describes pain as pressure in the center of his chest radiating into his shoulders and back, associated with nausea but no sob or diaphoresis. Pain onset yesterday evening but was able to sleep throughout the night. Restarted this AM approx 5 hours ago, rated as 4/10. Pt took NTG and one baby ASA with no change. Pain is worse with moving around but improves with rest. No pain currently. Pt reports similar chest pain approx 1 year ago, seen in ED with stress test. Pt h/o stents placed 3-4 years ago as well as cardiolites several times in past 2 years.  PCP Dr. Ouida Sills Cardiologist Dr. Dietrich Pates   Past Medical History  Diagnosis Date  . Coronary artery disease     Past Surgical History  Procedure Date  . Cardiac surgery     History reviewed. No pertinent family history.  History  Substance Use Topics  . Smoking status: Former Games developer  . Smokeless tobacco: Not on file  . Alcohol Use: Yes  Alcohol use, reports 4-5 beers in past month  Previous Medications   ACETAMINOPHEN (TYLENOL 8 HOUR PO)    Take 1 tablet by mouth every 8 (eight) hours as needed. Pain    ASPIRIN (ASPIRIN EC) 81 MG EC TABLET    Take 81 mg by mouth daily.     ATORVASTATIN (LIPITOR) 10 MG TABLET    Take 10 mg by mouth daily.     FOLIC ACID (FOLVITE) 1 MG TABLET    Take 1 mg by mouth daily.     OMEGA-3 FATTY ACIDS (FISH OIL) 1000 MG CAPS    Take 1 capsule by mouth 2 (two) times daily.     OMEPRAZOLE (PRILOSEC) 20 MG CAPSULE    Take 20 mg by mouth daily.     TETRAHYDROZOLINE-ZINC (VISINE-AC) 0.05-0.25 % OPHTHALMIC SOLUTION    Place 2 drops into both  eyes 3 (three) times daily as needed. Eye Irritation    VERAPAMIL (CALAN-SR) 180 MG CR TABLET    Take 180 mg by mouth daily.      Allergies as of 12/12/2010  . (No Known Allergies)     Review of Systems  All other systems reviewed and are negative.   10 Systems reviewed and are negative for acute change except as noted in the HPI.  Physical Exam  BP 127/82  Pulse 67  Temp(Src) 98.2 F (36.8 C) (Oral)  Resp 20  Ht 5\' 7"  (1.702 m)  Wt 184 lb (83.462 kg)  BMI 28.82 kg/m2  SpO2 98%  Physical Exam  Constitutional: He is oriented to person, place, and time. He appears well-developed and well-nourished.  HENT:  Head: Normocephalic and atraumatic.  Eyes: Conjunctivae and EOM are normal. Pupils are equal, round, and reactive to light.  Neck: Normal range of motion. Neck supple.  Cardiovascular: Normal rate, regular rhythm and normal heart sounds.   Pulmonary/Chest: Effort normal and breath sounds normal.  Abdominal: Soft. Bowel sounds are normal.  Musculoskeletal: Normal range of motion.  Neurological: He is alert and oriented to person, place, and time.  Skin: Skin is  warm and dry. No rash noted.  Psychiatric: He has a normal mood and affect. His behavior is normal. Thought content normal.    ED Course  Procedures  OTHER DATA REVIEWED: Nursing notes and vital signs reviewed. Prior records reviewed. PT jhad last cardiac cath in 2009 and had LAD lesion of 80% with stent, 50% lesion and mid RCA lesion of 90 % with stent placed. He had a cardiololyte done in Sept 2010 that was normal.   DIAGNOSTIC STUDIES: Oxygen Saturation is 98% on room air, normal by my Interpretation.  EKG:   Date: 12/12/2010  Rate: 73  Rhythm: normal sinus rhythm  QRS Axis: normal  Intervals: normal  ST/T Wave abnormalities: normal  Conduction Disutrbances:first-degree A-V block   Narrative Interpretation:   Old EKG Reviewed: none available   Cardiac Monitor: 12/12/2010 12:43 PM Sinus, rate  67, no ectopy   LABS / RADIOLOGY: Results for orders placed during the hospital encounter of 12/12/10  CBC      Component Value Range   WBC 9.5  4.0 - 10.5 (K/uL)   RBC 4.79  4.22 - 5.81 (MIL/uL)   Hemoglobin 15.6  13.0 - 17.0 (g/dL)   HCT 16.1  09.6 - 04.5 (%)   MCV 91.0  78.0 - 100.0 (fL)   MCH 32.6  26.0 - 34.0 (pg)   MCHC 35.8  30.0 - 36.0 (g/dL)   RDW 40.9  81.1 - 91.4 (%)   Platelets 188  150 - 400 (K/uL)  BASIC METABOLIC PANEL      Component Value Range   Sodium 138  135 - 145 (mEq/L)   Potassium 3.9  3.5 - 5.1 (mEq/L)   Chloride 101  96 - 112 (mEq/L)   CO2 25  19 - 32 (mEq/L)   Glucose, Bld 147 (*) 70 - 99 (mg/dL)   BUN 12  6 - 23 (mg/dL)   Creatinine, Ser 7.82  0.50 - 1.35 (mg/dL)   Calcium 9.4  8.4 - 95.6 (mg/dL)   GFR calc non Af Amer >60  >60 (mL/min)   GFR calc Af Amer >60  >60 (mL/min)  POCT I-STAT TROPONIN I      Component Value Range   Troponin i, poc 0.00  0.00 - 0.08 (ng/mL)   Comment 3           APTT      Component Value Range   aPTT 32  24 - 37 (seconds)  PROTIME-INR      Component Value Range   Prothrombin Time 14.0  11.6 - 15.2 (seconds)   INR 1.06  0.00 - 1.49   HEPATIC FUNCTION PANEL      Component Value Range   Total Protein 7.3  6.0 - 8.3 (g/dL)   Albumin 4.4  3.5 - 5.2 (g/dL)   AST 16  0 - 37 (U/L)   ALT 16  0 - 53 (U/L)   Alkaline Phosphatase 130 (*) 39 - 117 (U/L)   Total Bilirubin 0.9  0.3 - 1.2 (mg/dL)   Bilirubin, Direct 0.2  0.0 - 0.3 (mg/dL)   Indirect Bilirubin 0.7  0.3 - 0.9 (mg/dL)   Labs normal   Chest Portable 1 View  12/12/2010  *RADIOLOGY REPORT*  Clinical Data: Chest pain. Very artery disease.  PORTABLE CHEST - 1 VIEW  Comparison: 11/18/2009  Findings: Heart size and vascularity are normal and the lungs are clear.  No significant osseous abnormalities.  IMPRESSION: Normal chest.  Original Report Authenticated By: Gwynn Burly, M.D.  ED COURSE: 3:47 PM - Pt recheck, no chest pain. Results discussed. Waiting on  cardiology consult at this time. 34:00 Dr Diona Browner feels hospitalist can admit and consult cardiology tomorrow.   MDM:   IMPRESSION: Chest pain.     MEDS GIVEN IN ED:  Medications  nitroGLYCERIN (NITROGLYN) 2 % ointment 1 inch (1 inch Topical Given 12/12/10 1300)  aspirin chewable tablet 324 mg (324 mg Oral Given 12/12/10 1300)   Pt remains painfree.    4:31 PM Dr Ouida Sills will come and admit.     I personally performed the services described in this documentation, which was scribed in my presence. The recorded information has been reviewed and considered. Devoria Albe, MD, FACEP       Ward Givens, MD 12/12/10 1800

## 2010-12-12 NOTE — ED Notes (Signed)
Patient advocate brought meal tray for patient

## 2010-12-13 MED ORDER — ATORVASTATIN CALCIUM 10 MG PO TABS: 10.0000 mg | ORAL_TABLET | Freq: Every day | ORAL | Status: DC

## 2010-12-13 NOTE — Discharge Summary (Signed)
NAME:  Dennis [name], Dennis [name]             ACCOUNT NO.:  [id number]  MEDICAL RECORD NO.:  [id number]  LOCATION:  A320                          FACILITY:  [facility]  PHYSICIAN:  Kingsley Callander. Ouida Sills, MD       DATE OF BIRTH:  1950-07-30  DATE OF ADMISSION:  12/12/2010 DATE OF DISCHARGE:  09/07/2012LH                              DISCHARGE SUMMARY   DISCHARGE DIAGNOSES: 1. Chest pain, myocardial infarction, ruled out. 2. Coronary artery disease. 3. Hyperlipidemia. 4. Gastroesophageal reflux disease. 5. Benign prostatic hypertrophy.  HOSPITAL COURSE:  This patient is a 60 year old white male who presented to the emergency room with symptoms of recurrent brief chest pains. These episodes typically only lasted several seconds.  He was evaluated initially in the emergency room.  His initial cardiac enzymes were normal.  His EKG revealed no evidence of acute ischemia. The emergency room physician discussed the case with the cardiologist on call and observation in a monitored setting was recommended.  The patient was monitored overnight.  He had no recurrent chest pain. His repeat EKG and cardiac enzymes were normal.  He was improved and stable for discharge on the morning of the 7th.  He will be seen in follow-up in my office in 2 weeks.  DISCHARGE MEDICATIONS: 1. Lipitor 10 mg daily. 2. Verapamil SR 180 mg daily. 3. Folic acid 1 mg daily. 4. Aspirin 81 mg daily. 5. Fish oil 1000 mg b.i.d.     Kingsley Callander. Ouida Sills, MD     ROF/MEDQ  D:  12/13/2010  T:  12/13/2010  Job:  [id number]

## 2010-12-13 NOTE — Progress Notes (Signed)
Pt discharged with instructions and carenotes.  No new prescriptions at this time.  Pt verbalized understanding and voices no further questions or concerns at this time.  The pt left the floor ambulating in stable condition with staff and family.

## 2010-12-13 NOTE — H&P (Signed)
NAME:  Dennis [name], Dennis [name]             ACCOUNT NO.:  [id number]  MEDICAL RECORD NO.:  [id number]  LOCATION:  A320                          FACILITY:  [facility]  PHYSICIAN:  Kingsley Callander. Ouida Sills, MD       DATE OF BIRTH:  [birth year]  DATE OF ADMISSION:  12/12/2010 DATE OF DISCHARGE:  LH                             HISTORY & PHYSICAL   CHIEF COMPLAINT:  Chest pain.  HISTORY OF PRESENT ILLNESS:  This patient is a 60 year old white male with a history of coronary artery disease who presented to the emergency room after having recurrent episodes of substernal chest pressure, radiating to the back.  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  He felt several pains in his chest, although each episode only lasted a few seconds.  He did not rest well overnight and felt the symptoms again on the morning of admission.  He took a nitroglycerin which had no effect.  He denied any diaphoresis, vomiting, or syncopal episodes.  The pain did not seem to be related to position or activity.  He does not smoke.  He does not have diabetes or hypertension.  He is treated for hyperlipidemia with Lipitor.  He had an inferior MI, treated with Retavase in 1998.  He had three stents placed in 2009.  He had a negative nuclear stress test by Cardiology last year. He had been hospitalized and ruled out for an MI in August 2011.  His latest lipids revealed an LDL of 62 last December.  PAST MEDICAL HISTORY: 1. Coronary artery disease. 2. Hyperlipidemia. 3. GERD. 4. BPH.  ALLERGIES:  None.  SOCIAL HISTORY:  He does not smoke, drink, or use drugs.  FAMILY HISTORY:  His father had bypass surgery.  His brother has had chronic back pain.  REVIEW OF SYSTEMS:  Noncontributory.  PHYSICAL EXAM:  VITAL SIGNS:  Temperature 97.9, pulse 62, respirations 60, blood pressure 116/82, oxygen saturation 98% on room air. GENERAL:  Alert and in no distress. HEENT:  Eyes, nose, and pharynx unremarkable. NECK:   Supple with no JVD or thyromegaly.  No carotid bruits. LUNGS:  Clear. HEART:  Regular with no murmurs. ABDOMEN:  Nontender with no hepatosplenomegaly. EXTREMITIES:  Reveal normal pulses with no clubbing or edema. NEURO:  Grossly intact. LYMPH NODES:  No cervical or supraclavicular enlargement.  LABORATORY DATA:  White count 9.5, hemoglobin 15.6, platelets 188,000. Sodium 138, potassium 3.9, creatinine 0.75, calcium 9.4, troponin-I less than 0.30, glucose 147.  EKG reveals normal sinus rhythm with no ischemic changes.  Chest x-ray is normal.  IMPRESSION/PLAN: 1. Chest pain with history of coronary artery disease.  His symptoms     sound atypical.  His initial cardiac markers are normal and his EKG     is normal.  Emergency room physician discussed the case with     Cardiology who has recommended observation in the monitored setting     overnight and repeat cardiac enzymes.  He will be continued on     verapamil, Lipitor, or aspirin.  He will receive DVT prophylaxis     with Lovenox. 2. Hyperlipidemia.  Continue Lipitor. 3. Gastroesophageal reflux disease.  Continue  Prilosec. 4. Benign prostatic hypertrophy, stable.     Kingsley Callander. Ouida Sills, MD     ROF/MEDQ  D:  12/13/2010  T:  12/13/2010  Job:  [id number]

## 2010-12-30 LAB — CBC
HCT: 44.7
HCT: 38.1 — ABNORMAL LOW
HCT: 41
HCT: 42.4
Hemoglobin: 16
Hemoglobin: 13.5
Hemoglobin: 14.2
MCHC: 35
MCV: 92.1
MCV: 92
MCV: 93.9
MCV: 94.1
Platelets: 145 — ABNORMAL LOW
Platelets: 148 — ABNORMAL LOW
RBC: 4.3
RDW: 12.7
RDW: 12.7
RDW: 12.9
WBC: 7.9

## 2010-12-30 LAB — COMPREHENSIVE METABOLIC PANEL
Albumin: 3.7
BUN: 9
Calcium: 9.3
Chloride: 109
Creatinine, Ser: 0.87
GFR calc non Af Amer: >60
Total Bilirubin: 1

## 2010-12-30 LAB — LIPID PANEL
Cholesterol: 130
LDL Cholesterol: 79
Total CHOL/HDL Ratio: 4.2

## 2010-12-30 LAB — DIFFERENTIAL
Eosinophils Absolute: 0.8 — ABNORMAL HIGH
Eosinophils Relative: 11 — ABNORMAL HIGH
Lymphocytes Relative: 31
Lymphs Abs: 2.5
Monocytes Absolute: 0.6

## 2010-12-30 LAB — BASIC METABOLIC PANEL
BUN: 11
Chloride: 106
Chloride: 109
GFR calc non Af Amer: >60
GFR calc non Af Amer: >60
Glucose, Bld: 106 — ABNORMAL HIGH
Glucose, Bld: 89
Potassium: 4.1
Potassium: 3.9
Sodium: 139
Sodium: 143

## 2010-12-30 LAB — CK TOTAL AND CKMB (NOT AT ARMC)
CK, MB: 0.5
CK, MB: 0.5
CK, MB: 0.6
Relative Index: INVALID
Relative Index: INVALID
Relative Index: INVALID
Total CK: 46
Total CK: 49

## 2010-12-30 LAB — APTT: aPTT: 29

## 2010-12-30 LAB — TROPONIN I: Troponin I: <0.01

## 2010-12-30 LAB — HEMOGLOBIN A1C
Hgb A1c MFr Bld: 4.8
Mean Plasma Glucose: 94

## 2010-12-30 LAB — POCT CARDIAC MARKERS: Troponin i, poc: <0.05

## 2010-12-30 LAB — HEPARIN LEVEL (UNFRACTIONATED): Heparin Unfractionated: 0.84 — ABNORMAL HIGH

## 2011-01-06 LAB — BASIC METABOLIC PANEL
BUN: 13
BUN: 10
CO2: 25
CO2: 25
Chloride: 111
Creatinine, Ser: 0.77
GFR calc Af Amer: >60
Glucose, Bld: 99
Glucose, Bld: 87
Potassium: 4.1
Sodium: 138

## 2011-01-06 LAB — DIFFERENTIAL
Basophils Absolute: 0
Basophils Relative: 1
Eosinophils Absolute: 0.5
Eosinophils Relative: 7 — ABNORMAL HIGH
Monocytes Absolute: 0.7

## 2011-01-06 LAB — CK TOTAL AND CKMB (NOT AT ARMC)
CK, MB: 0.6
Relative Index: INVALID
Relative Index: INVALID
Relative Index: INVALID
Total CK: 59

## 2011-01-06 LAB — TROPONIN I: Troponin I: <0.01

## 2011-01-06 LAB — CBC
HCT: 42.2
Hemoglobin: 14.8
MCHC: 35.1
MCHC: 34.6
MCV: 95.8
RBC: 3.98 — ABNORMAL LOW
RDW: 13

## 2011-01-20 LAB — BASIC METABOLIC PANEL WITH GFR
BUN: 11
CO2: 27
Calcium: 8.1 — ABNORMAL LOW
Chloride: 103
Creatinine, Ser: 0.81
GFR calc non Af Amer: >60
Glucose, Bld: 105 — ABNORMAL HIGH
Potassium: 4
Sodium: 136

## 2011-01-20 LAB — CBC
Hemoglobin: 13.8
MCHC: 34.9
MCV: 92.6
RBC: 4.26

## 2011-01-20 LAB — DIFFERENTIAL
Basophils Relative: 0
Eosinophils Absolute: 0.5
Eosinophils Relative: 5
Monocytes Absolute: 0.6
Monocytes Relative: 7
Neutrophils Relative %: 74

## 2011-05-01 ENCOUNTER — Telehealth: Payer: Self-pay

## 2011-05-01 ENCOUNTER — Other Ambulatory Visit: Payer: Self-pay

## 2011-05-01 DIAGNOSIS — Z139 Encounter for screening, unspecified: Secondary | ICD-10-CM

## 2011-05-01 NOTE — Telephone Encounter (Signed)
Please note on instrcutions. Per Selena Batten, time change from 8:15 AM til 8:45 AM.

## 2011-05-01 NOTE — Telephone Encounter (Signed)
Patient on Prilosec and fish oil.  OK to schedule as is.

## 2011-05-01 NOTE — Telephone Encounter (Signed)
Gastroenterology Pre-Procedure Form  Pt's last colonoscopy was 06/23/2011 by [facility]  Request Date: 04/30/2011       Requesting Physician: Jena Gauss       PATIENT INFORMATION:  Dennis [name] is a 61 y.o., male (DOB=[birth year]).  PROCEDURE: Procedure(s) requested: colonoscopy Procedure Reason: screening for colon cancer  PATIENT REVIEW QUESTIONS: The patient reports the following:   1. Diabetes Melitis: no 2. Joint replacements in the past 12 months: no 3. Major health problems in the past 3 months: no 4. Has an artificial valve or MVP:no 5. Has been advised in past to take antibiotics in advance of a procedure like teeth cleaning: no}    MEDICATIONS & ALLERGIES:    Patient reports the following regarding taking any blood thinners:   Plavix? no Aspirin?no Coumadin?  no  Patient confirms/reports the following medications:  Current Outpatient Prescriptions  Medication Sig Dispense Refill  . aspirin (ASPIRIN EC) 81 MG EC tablet Take 81 mg by mouth daily.        Marland Kitchen atorvastatin (LIPITOR) 10 MG tablet Take 1 tablet (10 mg total) by mouth daily.  30 tablet  1  . verapamil (CALAN-SR) 180 MG CR tablet Take 180 mg by mouth daily.          Patient confirms/reports the following allergies:  No Known Allergies  Patient is appropriate to schedule for requested procedure(s): yes  AUTHORIZATION INFORMATION Primary Insurance:   ID #:   Group #:  Pre-Cert / Auth required: Pre-Cert / Auth #:   Secondary Insurance:   ID #:   Group #:  Pre-Cert / Auth required Pre-Cert / Auth #:   No orders of the defined types were placed in this encounter.    SCHEDULE INFORMATION: Procedure has been scheduled as follows:  Date: 05/14/2011           Time: 8:15 AM  Location: [facility] Short Stay  This Gastroenterology Pre-Precedure Form is being routed to the following provider(s) for review: R. Roetta Sessions, MD

## 2011-05-02 ENCOUNTER — Encounter (HOSPITAL_COMMUNITY): Payer: Self-pay | Admitting: Pharmacy Technician

## 2011-05-02 NOTE — Telephone Encounter (Signed)
Rx and instructions mailed to pt. Note that time changed from 8:15 AM to 8:45 AM included on his instructions.

## 2011-05-13 ENCOUNTER — Telehealth: Payer: Self-pay

## 2011-05-13 MED ORDER — SODIUM CHLORIDE 0.45 % IV SOLN
Freq: Once | INTRAVENOUS | Status: AC
  Administered 2011-05-14: 08:00:00 via INTRAVENOUS

## 2011-05-13 NOTE — Telephone Encounter (Signed)
Pt called. He is scheduled for his colonoscopy in the morning. He started the Trilyte Prep. Said he drank about 1/2 of the solution without any problem and then he got very sick on his stomach. He vomited up about half. I spoke with Gerrit Halls, NP and she said for me to tell the pt to wait for awhile and then have him continue very slowly. He does have some Crystal Light mixed in with it. He will try it later. If he has problems tonight, he will call early in the Am.

## 2011-05-14 ENCOUNTER — Encounter (HOSPITAL_COMMUNITY): Payer: Self-pay | Admitting: *Deleted

## 2011-05-14 ENCOUNTER — Encounter (HOSPITAL_COMMUNITY)
Admission: RE | Disposition: A | Discharge status: Home / Self Care | Payer: Self-pay | Source: Ambulatory Visit | Attending: Internal Medicine

## 2011-05-14 ENCOUNTER — Ambulatory Visit (HOSPITAL_COMMUNITY)
Admission: RE | Admit: 2011-05-14 | Discharge: 2011-05-14 | Disposition: A | Discharge status: Home / Self Care | Payer: BC Managed Care – PPO | Source: Ambulatory Visit | Attending: Internal Medicine | Admitting: Internal Medicine

## 2011-05-14 DIAGNOSIS — Z1211 Encounter for screening for malignant neoplasm of colon: Secondary | ICD-10-CM

## 2011-05-14 DIAGNOSIS — Z139 Encounter for screening, unspecified: Secondary | ICD-10-CM

## 2011-05-14 DIAGNOSIS — Z7982 Long term (current) use of aspirin: Secondary | ICD-10-CM | POA: Insufficient documentation

## 2011-05-14 DIAGNOSIS — K5730 Diverticulosis of large intestine without perforation or abscess without bleeding: Secondary | ICD-10-CM

## 2011-05-14 DIAGNOSIS — E7800 Pure hypercholesterolemia, unspecified: Secondary | ICD-10-CM | POA: Insufficient documentation

## 2011-05-14 HISTORY — PX: COLONOSCOPY: SHX5424

## 2011-05-14 HISTORY — DX: Pure hypercholesterolemia, unspecified: E78.00

## 2011-05-14 HISTORY — DX: Gastro-esophageal reflux disease without esophagitis: K21.9

## 2011-05-14 SURGERY — COLONOSCOPY
Anesthesia: Moderate Sedation

## 2011-05-14 MED ORDER — MIDAZOLAM HCL 5 MG/5ML IJ SOLN
INTRAMUSCULAR | Status: AC
  Filled 2011-05-14: qty 10

## 2011-05-14 MED ORDER — MEPERIDINE HCL 100 MG/ML IJ SOLN
INTRAMUSCULAR | Status: AC
  Filled 2011-05-14: qty 2

## 2011-05-14 MED ORDER — MEPERIDINE HCL 100 MG/ML IJ SOLN
INTRAMUSCULAR | Status: DC | PRN
  Administered 2011-05-14: 50 mg via INTRAVENOUS

## 2011-05-14 MED ORDER — STERILE WATER FOR IRRIGATION IR SOLN
Status: DC | PRN
  Administered 2011-05-14: 09:00:00

## 2011-05-14 MED ORDER — MIDAZOLAM HCL 5 MG/5ML IJ SOLN
INTRAMUSCULAR | Status: DC | PRN
  Administered 2011-05-14: 2 mg via INTRAVENOUS
  Administered 2011-05-14: 1 mg via INTRAVENOUS

## 2011-05-14 NOTE — Op Note (Signed)
[facility] [address] [place], [place]  [address]  COLONOSCOPY PROCEDURE REPORT  PATIENT:  Dennis, [name]  MR#:  [id number] BIRTHDATE:  1950-05-30, 60 yrs. old  GENDER:  male ENDOSCOPIST:  R. Roetta Sessions, MD FACP [facility] REF. BY:  Carylon Perches, M.D. PROCEDURE DATE:  05/14/2011 PROCEDURE:  screening colonoscopy  INDICATIONS:  average risk colorectal cancer screening; last examination about 10 years ago.  INFORMED CONSENT:  The risks, benefits, alternatives and imponderables including but not limited to bleeding, perforation as well as the possibility of a missed lesion have been reviewed. The potential for biopsy, lesion removal, etc. have also been discussed.  Questions have been answered.  All parties agreeable. Please see the history and physical in the medical record for more information.  MEDICATIONS:  Demerol 50 mg IV and Versed 3 mg IV in divided doses  DESCRIPTION OF PROCEDURE:  After a digital rectal exam was performed, the EC-[id number] ([id number]) colonoscope was advanced from the anus through the rectum and colon to the area of the cecum, ileocecal valve and appendiceal orifice.  The cecum was deeply intubated.  These structures were well-seen and photographed for the record.  From the level of the cecum and ileocecal valve, the scope was slowly and cautiously withdrawn.  The mucosal surfaces were carefully surveyed utilizing scope tip deflection to facilitate fold flattening as needed.  The scope was pulled down into the rectum where a thorough examination including retroflexion was performed. <<PROCEDUREIMAGES>>  FINDINGS:  Adequate preparation. Scattered left-sided diverticula; remainder of colonic and rectal mucosa appeared normal.  THERAPEUTIC / DIAGNOSTIC MANEUVERS PERFORMED:none  COMPLICATIONS:  none  CECAL WITHDRAWAL TIME:  9 minutes  IMPRESSION:   Colonic diverticulosis  RECOMMENDATIONS:   Screening colonoscopy in 10  years  ______________________________ R. Roetta Sessions, MD Caleen Essex  CC:  Carylon Perches, M.D.  n. eSIGNED:   R. Roetta Sessions at 05/14/2011 09:16 AM  Karmen Bongo, [phone]

## 2011-05-14 NOTE — H&P (Signed)
Primary Care Physician:  Carylon Perches, MD, MD Primary Gastroenterologist:  Dr.   Pre-Procedure History & Physical: HPI:  JAYDRIAN CORPENING is a 61 y.o. male is here for a screening colonoscopy. No bowel symptoms. Last colonoscopy about 10 years ago. No family history of polyps or colon cancer.  Past Medical History  Diagnosis Date  . Coronary artery disease   . Myocardial infarction   . Gout   . Hypercholesteremia   . GERD (gastroesophageal reflux disease)     Past Surgical History  Procedure Date  . Cardiac surgery   . Cardiac catheterization   . Colonoscopy   . Bilateral eye surgery     Prior to Admission medications   Medication Sig Start Date End Date Taking? Authorizing Provider  acetaminophen (TYLENOL) 500 MG tablet Take 1,000 mg by mouth every 6 (six) hours as needed. For pain   Yes Historical Provider, MD  aspirin (ASPIRIN EC) 81 MG EC tablet Take 81 mg by mouth daily.     Yes Historical Provider, MD  atorvastatin (LIPITOR) 10 MG tablet Take 1 tablet (10 mg total) by mouth daily. 12/13/10  Yes Carylon Perches, MD  Omega-3 Fatty Acids (FISH OIL) 1000 MG CAPS Take 1 capsule by mouth 2 (two) times daily.    Yes Historical Provider, MD  omeprazole (PRILOSEC) 20 MG capsule Take 20 mg by mouth every other day.    Yes Historical Provider, MD  verapamil (CALAN-SR) 180 MG CR tablet Take 180 mg by mouth daily.     Yes Historical Provider, MD    Allergies as of 05/01/2011  . (No Known Allergies)    Family History  Problem Relation Age of Onset  . Colon cancer Neg Hx     History   Social History  . Marital Status: Married    Spouse Name: N/A    Number of Children: N/A  . Years of Education: N/A   Occupational History  . Not on file.   Social History Main Topics  . Smoking status: Former Smoker -- 0.2 packs/day for 3 years  . Smokeless tobacco: Not on file  . Alcohol Use: 3.0 oz/week    5 Cans of beer per week  . Drug Use: No  . Sexually Active: Yes   Other Topics  Concern  . Not on file   Social History Narrative  . No narrative on file    Review of Systems: See HPI, otherwise negative ROS  Physical Exam: BP 142/103  Pulse 66  Temp(Src) 98 F (36.7 C) (Oral)  Resp 21  Ht 5\' 7"  (1.702 m)  Wt 182 lb (82.555 kg)  BMI 28.51 kg/m2  SpO2 98% General:   Alert,  Well-developed, well-nourished, pleasant and cooperative in NAD Head:  Normocephalic and atraumatic. Eyes:  Sclera clear, no icterus.   Conjunctiva pink. Ears:  Normal auditory acuity. Nose:  No deformity, discharge,  or lesions. Mouth:  No deformity or lesions, dentition normal. Neck:  Supple; no masses or thyromegaly. Lungs:  Clear throughout to auscultation.   No wheezes, crackles, or rhonchi. No acute distress. Heart:  Regular rate and rhythm; no murmurs, clicks, rubs,  or gallops. Abdomen:  Soft, nontender and nondistended. No masses, hepatosplenomegaly or hernias noted. Normal bowel sounds, without guarding, and without rebound.   Msk:  Symmetrical without gross deformities. Normal posture. Pulses:  Normal pulses noted. Extremities:  Without clubbing or edema. Neurologic:  Alert and  oriented x4;  grossly normal neurologically. Skin:  Intact without significant lesions or rashes.  Cervical Nodes:  No significant cervical adenopathy. Psych:  Alert and cooperative. Normal mood and affect.  Impression/Plan: DAWAUN BRANCATO is now here to undergo a screening colonoscopy.   Average risk screening examination.  The risks, benefits, limitations, imponderables and alternatives regarding colonoscopy have been reviewed with the patient. Questions have been answered. All parties agreeable.

## 2011-05-20 ENCOUNTER — Encounter (HOSPITAL_COMMUNITY): Payer: Self-pay | Admitting: Internal Medicine

## 2011-05-28 ENCOUNTER — Other Ambulatory Visit (HOSPITAL_COMMUNITY): Payer: Self-pay | Admitting: Internal Medicine

## 2011-05-29 ENCOUNTER — Encounter (HOSPITAL_COMMUNITY)
Admission: RE | Admit: 2011-05-29 | Discharge: 2011-05-29 | Disposition: A | Discharge status: Home / Self Care | Payer: BC Managed Care – PPO | Source: Ambulatory Visit | Attending: Internal Medicine | Admitting: Internal Medicine

## 2011-05-29 ENCOUNTER — Ambulatory Visit (HOSPITAL_COMMUNITY)
Admission: RE | Admit: 2011-05-29 | Discharge: 2011-05-29 | Disposition: A | Discharge status: Home / Self Care | Payer: BC Managed Care – PPO | Source: Ambulatory Visit | Attending: Internal Medicine | Admitting: Internal Medicine

## 2011-05-29 DIAGNOSIS — R079 Chest pain, unspecified: Secondary | ICD-10-CM

## 2011-05-29 DIAGNOSIS — I2510 Atherosclerotic heart disease of native coronary artery without angina pectoris: Secondary | ICD-10-CM

## 2011-05-29 MED ORDER — SODIUM CHLORIDE 0.9 % IJ SOLN
INTRAMUSCULAR | Status: AC
  Filled 2011-05-29: qty 10

## 2011-05-29 MED ORDER — REGADENOSON 0.4 MG/5ML IV SOLN
INTRAVENOUS | Status: AC
  Filled 2011-05-29: qty 5

## 2011-05-29 MED ORDER — TECHNETIUM TC 99M TETROFOSMIN IV KIT
30.0000 | PACK | Freq: Once | INTRAVENOUS | Status: AC | PRN
  Administered 2011-05-29: 28 via INTRAVENOUS

## 2011-05-29 NOTE — Progress Notes (Signed)
NAME:  Dennis [name], Dennis [name]             ACCOUNT NO.:  [id number]  MEDICAL RECORD NO.:  [id number]  LOCATION:                                 FACILITY:  PHYSICIAN:  Kingsley Callander. Ouida Sills, MD       DATE OF BIRTH:  [birth year]  DATE OF PROCEDURE: DATE OF DISCHARGE:                                PROGRESS NOTE   Myoview Stress Test Report  [name] underwent a Myoview stress test for evaluation of recent symptoms of chest pain.  Has a prior history of an inferior MI and a prior history of stents.  He exercised 9 minutes (completing stage III of the Bruce protocol), attaining a maximal heart rate of 159 (100% of the age predicted maximal heart rate) and a workload of 10.1 mets and discontinued exercise after surpassing his target heart rate.  There were no symptoms of chest pain.  There were no arrhythmias.  There were no ST-segment changes diagnostic of ischemia.  The baseline EKG revealed normal sinus rhythm at 83 beats per minute with inferior Q-waves consistent with his old inferior MI.  IMPRESSION:  No evidence of exercise-induced ischemia.  Myoview images are pending.     Kingsley Callander. Ouida Sills, MD     ROF/MEDQ  D:  05/29/2011  T:  05/29/2011  Job:  [id number]

## 2011-05-29 NOTE — Progress Notes (Signed)
Stress Lab Nurses Notes - Dennis [name] 05/29/2011  Reason for doing test: CAD and Chest Pain Type of test: Stress Myoview Nurse performing test: Parke Poisson, RN Nuclear Medicine Tech: Lyndel Pleasure Echo Tech: Not Applicable MD performing test: R. Ouida Sills Family MD: fagan Test explained and consent signed: yes IV started: 22g jelco, Saline lock flushed and No redness or edema Symptoms: fatigue Treatment/Intervention: None Reason test stopped: fatigue and reached target HR After recovery IV was: Discontinued via X-ray tech and No redness or edema Patient to return to Nuc. Med at : 8:15 Patient discharged: Home Patient's Condition upon discharge was: stable Comments: During test peak BP 168/78 & HR 158.  Recovery BP 138/80 & HR 92.  Symptoms resolved in recovery. [name] T

## 2011-05-30 ENCOUNTER — Encounter (HOSPITAL_COMMUNITY)
Admission: RE | Admit: 2011-05-30 | Discharge: 2011-05-30 | Disposition: A | Discharge status: Home / Self Care | Payer: BC Managed Care – PPO | Source: Ambulatory Visit | Attending: Internal Medicine | Admitting: Internal Medicine

## 2011-05-30 DIAGNOSIS — R079 Chest pain, unspecified: Secondary | ICD-10-CM | POA: Insufficient documentation

## 2011-05-30 DIAGNOSIS — I2510 Atherosclerotic heart disease of native coronary artery without angina pectoris: Secondary | ICD-10-CM | POA: Insufficient documentation

## 2011-05-30 MED ORDER — TECHNETIUM TC 99M TETROFOSMIN IV KIT
25.0000 | PACK | Freq: Once | INTRAVENOUS | Status: AC | PRN
  Administered 2011-05-30: 23 via INTRAVENOUS

## 2012-01-20 ENCOUNTER — Encounter (HOSPITAL_COMMUNITY): Payer: Self-pay

## 2012-01-20 ENCOUNTER — Emergency Department (HOSPITAL_COMMUNITY)
Admission: EM | Admit: 2012-01-20 | Discharge: 2012-01-20 | Disposition: A | Discharge status: Home / Self Care | Payer: BC Managed Care – PPO | Attending: Emergency Medicine | Admitting: Emergency Medicine

## 2012-01-20 ENCOUNTER — Emergency Department (HOSPITAL_COMMUNITY): Payer: BC Managed Care – PPO

## 2012-01-20 DIAGNOSIS — R0609 Other forms of dyspnea: Secondary | ICD-10-CM | POA: Insufficient documentation

## 2012-01-20 DIAGNOSIS — I252 Old myocardial infarction: Secondary | ICD-10-CM | POA: Insufficient documentation

## 2012-01-20 DIAGNOSIS — Z79899 Other long term (current) drug therapy: Secondary | ICD-10-CM | POA: Insufficient documentation

## 2012-01-20 DIAGNOSIS — Z7982 Long term (current) use of aspirin: Secondary | ICD-10-CM | POA: Insufficient documentation

## 2012-01-20 DIAGNOSIS — R079 Chest pain, unspecified: Secondary | ICD-10-CM | POA: Insufficient documentation

## 2012-01-20 DIAGNOSIS — R0989 Other specified symptoms and signs involving the circulatory and respiratory systems: Secondary | ICD-10-CM | POA: Insufficient documentation

## 2012-01-20 DIAGNOSIS — I2510 Atherosclerotic heart disease of native coronary artery without angina pectoris: Secondary | ICD-10-CM | POA: Insufficient documentation

## 2012-01-20 DIAGNOSIS — M549 Dorsalgia, unspecified: Secondary | ICD-10-CM | POA: Insufficient documentation

## 2012-01-20 LAB — COMPREHENSIVE METABOLIC PANEL
AST: 14 U/L (ref 0–37)
Albumin: 4.1 g/dL (ref 3.5–5.2)
BUN: 15 mg/dL (ref 6–23)
Chloride: 106 mEq/L (ref 96–112)
Creatinine, Ser: 0.85 mg/dL (ref 0.50–1.35)
Potassium: 3.6 mEq/L (ref 3.5–5.1)
Total Bilirubin: 0.7 mg/dL (ref 0.3–1.2)
Total Protein: 6.9 g/dL (ref 6.0–8.3)

## 2012-01-20 LAB — CBC
HCT: 40.9 % (ref 39.0–52.0)
MCHC: 36.7 g/dL — ABNORMAL HIGH (ref 30.0–36.0)
MCV: 90.7 fL (ref 78.0–100.0)
Platelets: 153 10*3/uL (ref 150–400)
RDW: 12.8 % (ref 11.5–15.5)
WBC: 8.4 10*3/uL (ref 4.0–10.5)

## 2012-01-20 LAB — PRO B NATRIURETIC PEPTIDE: Pro B Natriuretic peptide (BNP): 41.6 pg/mL (ref 0–125)

## 2012-01-20 LAB — TROPONIN I: Troponin I: <0.3 ng/mL (ref <0.30)

## 2012-01-20 NOTE — ED Provider Notes (Cosign Needed)
History   This chart was scribed for Dennis Lennert, MD by Gerlean Ren. This patient was seen in room [facility]04/[facility] and the patient's care was started at 15:42.   CSN: [id number]  Arrival date & time 01/20/12  1442   First MD Initiated Contact with Patient 01/20/12 1540      Chief Complaint  Patient presents with  . Chest Pain    (Consider location/radiation/quality/duration/timing/severity/associated sxs/prior treatment) The history is provided by the patient. No language interpreter was used.   Dennis [name] is a 61 y.o. male who presents to the Emergency Department complaining of 3 days of waxing-and-waning, aching upper chest pain that radiates to upper back that pt believes began after heavy lifting performed 3 days ago with associated mild nausea and mild dyspnea.  Pt denies sweating, fever, neck pain, sore throat, visual disturbance, cough, abdominal pain, emesis, diarrhea, urinary symptoms, HA, weakness, numbness and rash as associated symptoms.  Pt has a h/o CAD and MI, but denies pain is similar to that associated with MI.  Pt is a former smoker and reports alcohol use.    Past Medical History  Diagnosis Date  . Coronary artery disease   . Myocardial infarction   . Gout   . Hypercholesteremia   . GERD (gastroesophageal reflux disease)     Past Surgical History  Procedure Date  . Cardiac surgery   . Cardiac catheterization   . Colonoscopy   . Bilateral eye surgery   . Colonoscopy 05/14/2011    Procedure: COLONOSCOPY;  Surgeon: Corbin Ade, MD;  Location: [facility];  Service: Endoscopy;  Laterality: N/A;  8:15 AM    Family History  Problem Relation Age of Onset  . Colon cancer Neg Hx     History  Substance Use Topics  . Smoking status: Former Smoker -- 0.2 packs/day for 3 years  . Smokeless tobacco: Not on file  . Alcohol Use: 3.0 oz/week    5 Cans of beer per week      Review of Systems  Constitutional: Negative for fatigue.  HENT: Negative for  congestion, sinus pressure and ear discharge.   Eyes: Negative for discharge.  Respiratory: Negative for cough.   Cardiovascular: Positive for chest pain.  Gastrointestinal: Negative for abdominal pain and diarrhea.  Genitourinary: Negative for frequency and hematuria.  Musculoskeletal: Positive for back pain.  Skin: Negative for rash.  Neurological: Negative for seizures and headaches.  Hematological: Negative.   Psychiatric/Behavioral: Negative for hallucinations.    Allergies  Review of patient's allergies indicates no known allergies.  Home Medications   Current Outpatient Rx  Name Route Sig Dispense Refill  . ACETAMINOPHEN 500 MG PO TABS Oral Take 1,000 mg by mouth every 6 (six) hours as needed. For pain    . ASPIRIN 81 MG PO TBEC Oral Take 81 mg by mouth daily.      . ATORVASTATIN CALCIUM 10 MG PO TABS Oral Take 1 tablet (10 mg total) by mouth daily. 30 tablet 1  . FISH OIL 1000 MG PO CAPS Oral Take 1 capsule by mouth 2 (two) times daily.     Marland Kitchen OMEPRAZOLE 20 MG PO CPDR Oral Take 20 mg by mouth every other day.     Marland Kitchen VERAPAMIL HCL 180 MG PO TBCR Oral Take 180 mg by mouth daily.        BP 139/79  Pulse 72  Temp 98.3 F (36.8 C) (Oral)  Resp 20  Ht 5\' 9"  (1.753 m)  Wt  184 lb (83.462 kg)  BMI 27.17 kg/m2  SpO2 100%  Physical Exam  Nursing note and vitals reviewed. Constitutional: He is oriented to person, place, and time. He appears well-developed.  HENT:  Head: Normocephalic and atraumatic.  Eyes: Conjunctivae normal and EOM are normal. No scleral icterus.  Neck: Neck supple. No thyromegaly present.  Cardiovascular: Normal rate, regular rhythm and normal heart sounds.  Exam reveals no gallop and no friction rub.   No murmur heard. Pulmonary/Chest: Effort normal. No stridor. He has no wheezes. He has no rales. He exhibits no tenderness.  Abdominal: Soft. He exhibits no distension. There is no tenderness. There is no rebound.  Musculoskeletal: Normal range of motion.  He exhibits no edema.  Lymphadenopathy:    He has no cervical adenopathy.  Neurological: He is oriented to person, place, and time. Coordination normal.  Skin: No rash noted. No erythema.  Psychiatric: He has a normal mood and affect. His behavior is normal.    ED Course  Procedures (including critical care time) DIAGNOSTIC STUDIES: Oxygen Saturation is 100% on room air, normal by my interpretation.    COORDINATION OF CARE: 15:47- Patient and family informed of clinical course, understand medical decision-making process, and agree with plan.  Ordered d-dimer, troponin, pro b natriuretic peptide, CBC, c-met, and chest XR.       Labs Reviewed  CBC  COMPREHENSIVE METABOLIC PANEL  TROPONIN I  PRO B NATRIURETIC PEPTIDE   Results for orders placed during the hospital encounter of 01/20/12  CBC      Component Value Range   WBC 8.4  4.0 - 10.5 K/uL   RBC 4.51  4.22 - 5.81 MIL/uL   Hemoglobin 15.0  13.0 - 17.0 g/dL   HCT 40.9  81.1 - 91.4 %   MCV 90.7  78.0 - 100.0 fL   MCH 33.3  26.0 - 34.0 pg   MCHC 36.7 (*) 30.0 - 36.0 g/dL   RDW 78.2  95.6 - 21.3 %   Platelets 153  150 - 400 K/uL  COMPREHENSIVE METABOLIC PANEL      Component Value Range   Sodium 141  135 - 145 mEq/L   Potassium 3.6  3.5 - 5.1 mEq/L   Chloride 106  96 - 112 mEq/L   CO2 26  19 - 32 mEq/L   Glucose, Bld 105 (*) 70 - 99 mg/dL   BUN 15  6 - 23 mg/dL   Creatinine, Ser 0.86  0.50 - 1.35 mg/dL   Calcium 9.4  8.4 - 57.8 mg/dL   Total Protein 6.9  6.0 - 8.3 g/dL   Albumin 4.1  3.5 - 5.2 g/dL   AST 14  0 - 37 U/L   ALT 14  0 - 53 U/L   Alkaline Phosphatase 83  39 - 117 U/L   Total Bilirubin 0.7  0.3 - 1.2 mg/dL   GFR calc non Af Amer >90  >90 mL/min   GFR calc Af Amer >90  >90 mL/min  TROPONIN I      Component Value Range   Troponin I <0.30  <0.30 ng/mL  PRO B NATRIURETIC PEPTIDE      Component Value Range   Pro B Natriuretic peptide (BNP) 41.6  0 - 125 pg/mL  D-DIMER, QUANTITATIVE      Component Value  Range   D-Dimer, Quant <0.27  0.00 - 0.48 ug/mL-FEU    Dg Chest 2 View  01/20/2012  *RADIOLOGY REPORT*  Clinical Data: Chest pain  CHEST -  2 VIEW  Comparison:  December 12, 2010  Findings: Lungs clear.  Heart size and pulmonary vascularity are normal.  No adenopathy.  No bone lesions.  IMPRESSION: Lungs clear.   Original Report Authenticated By: Arvin Collard. WOODRUFF III, M.D.      No diagnosis found.    Date: 01/20/2012  Rate: 67  Rhythm: normal sinus rhythm  QRS Axis: normal  Intervals: normal  ST/T Wave abnormalities: normal  Conduction Disutrbances:none  Narrative Interpretation:   Old EKG Reviewed: none available Pt feels fine at discharge and wants to follow up as out pt  MDM    The chart was scribed for me under my direct supervision.  I personally performed the history, physical, and medical decision making and all procedures in the evaluation of this patient.Dennis Lennert, MD 01/20/12 [id number]

## 2012-01-20 NOTE — ED Notes (Signed)
Pt c/o generalized chest pain that began Sunday. Pt states he thinks it started after doing heavy lifting. Pt also c/o nausea and "some" SOB. Pt has hx of MI and stents.

## 2012-01-20 NOTE — ED Notes (Signed)
Pt reports chest pain/ back pain that comes and goes since Sunday, +sob at times, +nausea at times, denies fever or cough.

## 2012-01-21 ENCOUNTER — Encounter: Payer: Self-pay | Admitting: Cardiology

## 2012-01-22 ENCOUNTER — Encounter: Payer: Self-pay | Admitting: Cardiology

## 2012-01-22 ENCOUNTER — Ambulatory Visit (INDEPENDENT_AMBULATORY_CARE_PROVIDER_SITE_OTHER): Payer: BC Managed Care – PPO | Admitting: Cardiology

## 2012-01-22 VITALS — BP 142/88 | HR 65 | Ht 67.0 in | Wt 190.8 lb

## 2012-01-22 DIAGNOSIS — R079 Chest pain, unspecified: Secondary | ICD-10-CM

## 2012-01-22 DIAGNOSIS — I2510 Atherosclerotic heart disease of native coronary artery without angina pectoris: Secondary | ICD-10-CM

## 2012-01-22 MED ORDER — NAPROXEN 500 MG PO TABS: 500.0000 mg | ORAL_TABLET | Freq: Two times a day (BID) | ORAL | Status: DC

## 2012-01-22 NOTE — Progress Notes (Signed)
HPI Dennis [name] comes in office today for evaluation and management of some chest discomfort. He lifted a floor jack and felt something give in his chest. Several days later he continued to her across the front of his chest and across his shoulders and the back. He went to the emergency room. EKG was normal. Troponins were negative. Chest x-ray was normal.  He continues to have some aching across her shoulders and the back. He denies any nausea vomiting diaphoresis or radiation of the discomfort. He denies exertional worsening.  ER records reviewed. He seems to be compliant with his medications.  Past Medical History  Diagnosis Date  . Coronary artery disease   . Myocardial infarction   . Gout   . Hypercholesteremia   . GERD (gastroesophageal reflux disease)     Current Outpatient Prescriptions  Medication Sig Dispense Refill  . acetaminophen (TYLENOL) 500 MG tablet Take 1,000 mg by mouth every 6 (six) hours as needed. For pain      . aspirin (ASPIRIN EC) 81 MG EC tablet Take 81 mg by mouth every morning.       Marland Kitchen atorvastatin (LIPITOR) 10 MG tablet Take 10 mg by mouth every evening.      Marland Kitchen omeprazole (PRILOSEC) 20 MG capsule Take 20 mg by mouth every other day.       . Saline (SIMPLY SALINE) 0.9 % AERS Place 1 spray into the nose daily as needed. For nasal congestion      . verapamil (CALAN-SR) 180 MG CR tablet Take 180 mg by mouth every morning.       . naproxen (NAPROSYN) 500 MG tablet Take 1 tablet (500 mg total) by mouth 2 (two) times daily with a meal.  20 tablet  0    No Known Allergies  Family History  Problem Relation Age of Onset  . Colon cancer Neg Hx     History   Social History  . Marital Status: Married    Spouse Name: N/A    Number of Children: N/A  . Years of Education: N/A   Occupational History  . Not on file.   Social History Main Topics  . Smoking status: Former Smoker -- 0.2 packs/day for 3 years  . Smokeless tobacco: Not on file  . Alcohol Use:  3.0 oz/week    5 Cans of beer per week  . Drug Use: No  . Sexually Active: Yes   Other Topics Concern  . Not on file   Social History Narrative  . No narrative on file    ROS ALL NEGATIVE EXCEPT THOSE NOTED IN HPI  PE  General Appearance: well developed, well nourished in no acute distress HEENT: symmetrical face, PERRLA, good dentition  Neck: no JVD, thyromegaly, or adenopathy, trachea midline Chest: symmetric without deformity no chest Dennis [name] or shoulder tenderness. Cardiac: PMI non-displaced, RRR, normal S1, S2, no gallop or murmur, no rub Lung: clear to ausculation and percussion Vascular: all pulses full without bruits  Abdominal: nondistended, nontender, good bowel sounds, no HSM, no bruits Extremities: no cyanosis, clubbing or edema, no sign of DVT, no varicosities  Skin: normal color, no rashes Neuro: alert and oriented x 3, non-focal Pysch: normal affect  EKG Normal sinus rhythm, no ST segment changes. BMET    Component Value Date/Time   NA 141 01/20/2012 1539   K 3.6 01/20/2012 1539   CL 106 01/20/2012 1539   CO2 26 01/20/2012 1539   GLUCOSE 105* 01/20/2012 1539   BUN 15 01/20/2012  1539   CREATININE 0.85 01/20/2012 1539   CALCIUM 9.4 01/20/2012 1539   GFRNONAA >90 01/20/2012 1539   GFRAA >90 01/20/2012 1539    Lipid Panel     Component Value Date/Time   CHOL 121 03/08/2010 1110   TRIG 130 03/08/2010 1110   HDL 33 03/08/2010 1110   CHOLHDL 4.2 06/26/2007 0835   VLDL 20 06/26/2007 0835   LDLCALC 62 03/08/2010 1110    CBC    Component Value Date/Time   WBC 8.4 01/20/2012 1539   RBC 4.51 01/20/2012 1539   HGB 15.0 01/20/2012 1539   HCT 40.9 01/20/2012 1539   PLT 153 01/20/2012 1539   MCV 90.7 01/20/2012 1539   MCH 33.3 01/20/2012 1539   MCHC 36.7* 01/20/2012 1539   RDW 12.8 01/20/2012 1539   LYMPHSABS 1.2 11/08/2009 0911   MONOABS 0.5 11/08/2009 0911   EOSABS 0.4 11/08/2009 0911   BASOSABS 0.0 11/08/2009 0911

## 2012-01-22 NOTE — Assessment & Plan Note (Signed)
This is musculoskeletal discomfort from lifting a floor jack. I placed him on naproxen 500 mg twice a day for 10 days. Heating pad over his shoulders recommended when necessary. Avoid exacerbating activity. Followup in a year for cardiac disease.

## 2012-01-22 NOTE — Patient Instructions (Addendum)
Your physician recommends that you schedule a follow-up appointment in: 1 year  Your physician has recommended you make the following change in your medication:  Naproxen 500 mg twice daily for 10 days  Moist heat to area  Avoid strenuous activity

## 2012-01-23 NOTE — Addendum Note (Signed)
Addended by: Reather Laurence A on: 01/23/2012 12:20 PM   Modules accepted: Orders

## 2012-02-17 ENCOUNTER — Ambulatory Visit: Payer: BC Managed Care – PPO | Admitting: Cardiology

## 2012-04-27 ENCOUNTER — Encounter: Payer: Self-pay | Admitting: Cardiology

## 2013-01-21 ENCOUNTER — Encounter: Payer: Self-pay | Admitting: *Deleted

## 2013-01-21 ENCOUNTER — Encounter: Payer: Self-pay | Admitting: Cardiology

## 2013-01-21 ENCOUNTER — Ambulatory Visit (INDEPENDENT_AMBULATORY_CARE_PROVIDER_SITE_OTHER): Payer: BC Managed Care – PPO | Admitting: Cardiology

## 2013-01-21 VITALS — BP 150/100 | HR 77 | Ht 67.0 in | Wt 188.5 lb

## 2013-01-21 DIAGNOSIS — R079 Chest pain, unspecified: Secondary | ICD-10-CM

## 2013-01-21 MED ORDER — ISOSORBIDE MONONITRATE ER 30 MG PO TB24: 30.0000 mg | ORAL_TABLET | Freq: Every day | ORAL | Status: DC

## 2013-01-21 NOTE — Patient Instructions (Addendum)
Your physician recommends that you schedule a follow-up appointment in: 3 WEEKS  Your physician has requested that you have an echocardiogram. Echocardiography is a painless test that uses sound waves to create images of your heart. It provides your doctor with information about the size and shape of your heart and how well your heart's chambers and valves are working. This procedure takes approximately one hour. There are no restrictions for this procedure.  Your physician has requested that you have en exercise stress myoview. For further information please visit [email or web address] Please follow instruction sheet, as given.  WE WILL CALL YOU WITH YOUR TEST RESULTS/INSTRUCTIONS/NEXT STEPS ONCE RECEIVED BY THE PROVIDER  PLEASE BE ADVISED YOU WILL STILL NEED TO KEEP YOUR FOLLOW UP APPOINTMENT TO DISCUSS FURTHER DETAILS OF YOUR TEST RESULTS/NEXT STEPS/FUTURE PLAN OF CARE WITH YOUR PROVIDER DESPITE THE FACT THAT YOU MAY HAVE NORMAL TEST RESULTS   Your physician has recommended you make the following change in your medication:   1) START TAKING IMDUR 30MG  ONCE DAILY

## 2013-01-21 NOTE — Progress Notes (Signed)
Clinical Summary Mr. Dieguez is a 62 y.o.maleformer patient of Dr Daleen Squibb, seen today for follow up of the following problems   1. CAD - cath 06/2007 in the setting of unstable angina. LV gram LVEF 60%.  - PCI: DES to  LAD,PTCA to diagonal, and DES to RCA - stress MPI 05/2011: fixed defect inferior wall, possible artifact. LVEF 61%  - describes some occasional chest discomfort. Dull/pressure pain 1-2/10 in mid chest. Does not note any other related symptoms during these episodes. Can occur at rest or with exertion. Nothing makes better or worse. Can last up to 30 minutes. Occurs multiple times day, symptoms started 2 weeks ago. No change in frequency or severity or onset. Not overly similar to previous chest pain.  Increased DOE over the same period of time  - compliant with medications: ASA, atorva, verapamil.   2. HL -compliant with statin - Jan 2014 panel: LDL 57 TC 126 HDL 32 TG 187  Past Medical History  Diagnosis Date  . Coronary artery disease   . Myocardial infarction   . Gout   . Hypercholesteremia   . GERD (gastroesophageal reflux disease)      No Known Allergies   Current Outpatient Prescriptions  Medication Sig Dispense Refill  . acetaminophen (TYLENOL) 500 MG tablet Take 1,000 mg by mouth every 6 (six) hours as needed. For pain      . aspirin (ASPIRIN EC) 81 MG EC tablet Take 81 mg by mouth every morning.       Marland Kitchen atorvastatin (LIPITOR) 10 MG tablet Take 10 mg by mouth every evening.      . naproxen (NAPROSYN) 500 MG tablet Take 1 tablet (500 mg total) by mouth 2 (two) times daily with a meal.  20 tablet  0  . omeprazole (PRILOSEC) 20 MG capsule Take 20 mg by mouth every other day.       . Saline (SIMPLY SALINE) 0.9 % AERS Place 1 spray into the nose daily as needed. For nasal congestion      . verapamil (CALAN-SR) 180 MG CR tablet Take 180 mg by mouth every morning.        No current facility-administered medications for this visit.     Past Surgical  History  Procedure Laterality Date  . Cardiac surgery    . Cardiac catheterization    . Colonoscopy    . Bilateral eye surgery    . Colonoscopy  05/14/2011    Procedure: COLONOSCOPY;  Surgeon: Corbin Ade, MD;  Location: [facility];  Service: Endoscopy;  Laterality: N/A;  8:15 AM     No Known Allergies    Family History  Problem Relation Age of Onset  . Colon cancer Neg Hx      Social History Mr. Pettijohn reports that he has quit smoking. He does not have any smokeless tobacco history on file. Mr. Sistrunk reports that he drinks about 3.0 ounces of alcohol per week.   Review of Systems CONSTITUTIONAL: No weight loss, fever, chills, weakness or fatigue.  HEENT: Eyes: No visual loss, blurred vision, double vision or yellow sclerae.No hearing loss, sneezing, congestion, runny nose or sore throat.  SKIN: No rash or itching.  CARDIOVASCULAR:  RESPIRATORY: No shortness of breath, cough or sputum.  GASTROINTESTINAL: No anorexia, nausea, vomiting or diarrhea. No abdominal pain or blood.  GENITOURINARY: No burning on urination, no polyuria NEUROLOGICAL: No headache, dizziness, syncope, paralysis, ataxia, numbness or tingling in the extremities. No change in bowel or  bladder control.  MUSCULOSKELETAL: No muscle, back pain, joint pain or stiffness.  LYMPHATICS: No enlarged nodes. No history of splenectomy.  PSYCHIATRIC: No history of depression or anxiety.  ENDOCRINOLOGIC: No reports of sweating, cold or heat intolerance. No polyuria or polydipsia.  Marland Kitchen   Physical Examination There were no vitals filed for this visit. There were no vitals filed for this visit.  Gen: resting comfortably, no acute distress HEENT: no scleral icterus, pupils equal round and reactive, no palptable cervical adenopathy,  CV Resp: Clear to auscultation bilaterally GI: abdomen is soft, non-tender, non-distended, normal bowel sounds, no hepatosplenomegaly MSK: extremities are warm, no edema.  Skin:  warm, no rash Neuro:  no focal deficits Psych: appropriate affect   Diagnostic Studies 06/2007 Cath RESULTS: The left main coronary. The left main coronary was free of  significant disease.  The left anterior descending artery. The left anterior descending  artery gave rise to a diagonal Ysmael Hires and septal perforator. There were  50% and 80% stenoses in the proximal LAD, with segmental disease  overlapping the diagonal Zykera Abella. There was 80% ostial stenosis in the  diagonal Ellisa Devivo, which represented a bifurcation lesion.  The circumflex artery. The circumflex artery gave rise to three  posterolateral branches. These vessels were free of significant  disease.  The right coronary artery. The right coronary artery was a moderate-  sized vessel that gave rise to a right ventricular Lanier Felty, a posterior  descending Ashiya Kinkead, and a small posterolateral Tempestt Silba. There was 90%  stenosis in the proximal to midvessel, with some segmental disease.  The left ventriculogram. The left ventriculogram performed in the RAO  projection showed good wall motion. The estimated ejection fraction was  60%. 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  Following stenting of the lesion in the LAD, the stenosis improved from  80% to 0%.  Following PTCA of the diagonal Trysten Berti, the stenosis improved from 80% to  a residual 50% following stent deployment in the LAD. This was treated  with angioplasty only.  The right coronary artery lesion. The right coronary artery lesion  improved from 90% to 0% using a PLATINUM drug-eluting stent.  CONCLUSION:  1. Coronary artery disease, with 80% stenosis in the proximal LAD, 80%  ostial stenosis in the first diagonal Deondrick Searls, no significant  obstruction in the circumflex artery, and 90% stenosis in the  proximal to midright coronary artery, with normal LV function.  2. Successful PCI to the bifurcation lesion in the LAD diagonal  Carletha Dawn, with improvement in the LAD  stenosis from 80% to 0% using a  Promus drug-eluting stent, and improvement in the diagonal lesion  from 80% to 50% using angioplasty alone.  3. Successful PCI of the lesion in the proximal to midright coronary  artery using a PLATINUM drug-eluting stent, with improvement in the  central narrowing from 90% to 0%.   stress MPI 05/2011: fixed defect inferior wall, possible artifact. LVEF 61%  01/21/13 Clinic EKG: sinus rhythm, normal axis, no ischemic changes Assessment and Plan  1. Chest pain - concerning for possible cardiac etiology. Notes some increased DOE as well over this period of time - obtain echo, exercise stress MPI - start imdur as second antianginal. He has been on verapamil for several years. His history of prior MI is unclear, will review records further. His last stents were for unstable angina. If true MI then would do better on beta blocker as opposed to verapamil.  - pending bp response to imdur, would also  benefit adding ACE-I to regimen at follow up.    2. HL - LDL at goal, continue statin    Antoine Poche, M.D., F.A.C.C.

## 2013-02-01 ENCOUNTER — Encounter (HOSPITAL_COMMUNITY)
Admission: RE | Admit: 2013-02-01 | Discharge: 2013-02-01 | Disposition: A | Discharge status: Home / Self Care | Payer: BC Managed Care – PPO | Source: Ambulatory Visit | Attending: Cardiology | Admitting: Cardiology

## 2013-02-01 ENCOUNTER — Encounter (HOSPITAL_COMMUNITY): Payer: Self-pay

## 2013-02-01 ENCOUNTER — Ambulatory Visit (HOSPITAL_COMMUNITY)
Admission: RE | Admit: 2013-02-01 | Discharge: 2013-02-01 | Disposition: A | Discharge status: Home / Self Care | Payer: BC Managed Care – PPO | Source: Ambulatory Visit | Attending: Cardiology | Admitting: Cardiology

## 2013-02-01 DIAGNOSIS — I2510 Atherosclerotic heart disease of native coronary artery without angina pectoris: Secondary | ICD-10-CM | POA: Insufficient documentation

## 2013-02-01 DIAGNOSIS — I517 Cardiomegaly: Secondary | ICD-10-CM

## 2013-02-01 DIAGNOSIS — R079 Chest pain, unspecified: Secondary | ICD-10-CM | POA: Insufficient documentation

## 2013-02-01 DIAGNOSIS — Z87891 Personal history of nicotine dependence: Secondary | ICD-10-CM | POA: Insufficient documentation

## 2013-02-01 HISTORY — DX: Essential (primary) hypertension: I10

## 2013-02-01 MED ORDER — SODIUM CHLORIDE 0.9 % IJ SOLN
INTRAMUSCULAR | Status: AC
  Administered 2013-02-01: 10 mL via INTRAVENOUS
  Filled 2013-02-01: qty 10

## 2013-02-01 MED ORDER — TECHNETIUM TC 99M SESTAMIBI - CARDIOLITE
30.0000 | Freq: Once | INTRAVENOUS | Status: AC | PRN
  Administered 2013-02-01: 11:00:00 30 via INTRAVENOUS

## 2013-02-01 MED ORDER — TECHNETIUM TC 99M SESTAMIBI - CARDIOLITE
10.0000 | Freq: Once | INTRAVENOUS | Status: AC | PRN
  Administered 2013-02-01: 10 via INTRAVENOUS

## 2013-02-01 NOTE — Progress Notes (Signed)
Stress Lab Nurses Notes - Dennis [name] 02/01/2013 Reason for doing test: CAD and Chest Pain Type of test: Stress Cardiolite Nurse performing test: Parke Poisson, RN Nuclear Medicine Tech: Lyndel Pleasure Echo Tech: Not Applicable MD performing test: [place]/ Joni Reining NP Family MD: Ouida Sills Test explained and consent signed: yes IV started: 22g jelco, Saline lock flushed, No redness or edema and Saline lock started in radiology Symptoms: SOB Treatment/Intervention: None Reason test stopped: reached target HR After recovery IV was: Discontinued via X-ray tech and No redness or edema Patient to return to Nuc. Med at : 12:00 Patient discharged: Home Patient's Condition upon discharge was: stable Comments: During test peak BP 155/80 & HR 151.  Recovery BP 154/83 & HR 82.  Symptoms resolved in recovery. [name] T

## 2013-02-01 NOTE — Progress Notes (Signed)
*  PRELIMINARY RESULTS* Echocardiogram 2D Echocardiogram has been performed.  Vasili Fok 02/01/2013, 2:31 PM

## 2013-02-03 ENCOUNTER — Other Ambulatory Visit: Payer: Self-pay | Admitting: *Deleted

## 2013-02-03 DIAGNOSIS — I10 Essential (primary) hypertension: Secondary | ICD-10-CM

## 2013-02-03 MED ORDER — LISINOPRIL 10 MG PO TABS: 10.0000 mg | ORAL_TABLET | Freq: Every day | ORAL | Status: DC

## 2013-02-10 ENCOUNTER — Other Ambulatory Visit: Payer: Self-pay

## 2013-02-22 ENCOUNTER — Other Ambulatory Visit: Payer: Self-pay | Admitting: Cardiology

## 2013-02-22 LAB — BASIC METABOLIC PANEL
BUN: 14 mg/dL (ref 6–23)
CO2: 27 mEq/L (ref 19–32)
Chloride: 106 mEq/L (ref 96–112)
Creat: 0.78 mg/dL (ref 0.50–1.35)
Potassium: 4.8 mEq/L (ref 3.5–5.3)

## 2013-02-23 ENCOUNTER — Encounter: Payer: Self-pay | Admitting: *Deleted

## 2013-03-02 ENCOUNTER — Ambulatory Visit (INDEPENDENT_AMBULATORY_CARE_PROVIDER_SITE_OTHER): Payer: BC Managed Care – PPO | Admitting: Cardiology

## 2013-03-02 VITALS — BP 128/83 | HR 71 | Ht 67.0 in | Wt 184.0 lb

## 2013-03-02 DIAGNOSIS — R079 Chest pain, unspecified: Secondary | ICD-10-CM

## 2013-03-02 NOTE — Progress Notes (Signed)
Clinical Summary Mr. Pham is a 62 y.o.male seen today for follow up of the following medical problems.    1. CAD  - cath 06/2007 in the setting of unstable angina, PCI with DES to LAD,PTCA to diagonal, and DES to RCA  LV gram LVEF 60% - stress MPI 05/2011: fixed defect inferior wall, possible artifact. LVEF 61%  - describes some occasional chest discomfort. Dull/pressure pain 1-2/10 in mid chest. Does not note any other related symptoms during these episodes. Can occur at rest or with exertion. Nothing makes better or worse. Can last up to 30 minutes. Occurs multiple times day, symptoms started 2 weeks ago. No change in frequency or severity or onset. Not overly similar to previous chest pain.  Increased DOE over the same period of time  - compliant with medications: ASA, atorva, verapamil.   - since last visit symptoms have improved. He also completed his echo and stress test.     Past Medical History  Diagnosis Date  . Coronary artery disease   . Myocardial infarction   . Gout   . Hypercholesteremia   . GERD (gastroesophageal reflux disease)   . Hypertension      No Known Allergies   Current Outpatient Prescriptions  Medication Sig Dispense Refill  . acetaminophen (TYLENOL) 500 MG tablet Take 1,000 mg by mouth every 6 (six) hours as needed. For pain      . aspirin (ASPIRIN EC) 81 MG EC tablet Take 81 mg by mouth every morning.       Marland Kitchen atorvastatin (LIPITOR) 10 MG tablet Take 10 mg by mouth every evening.      . isosorbide mononitrate (IMDUR) 30 MG 24 hr tablet Take 1 tablet (30 mg total) by mouth daily.  90 tablet  1  . lisinopril (PRINIVIL,ZESTRIL) 10 MG tablet Take 1 tablet (10 mg total) by mouth daily.  90 tablet  3  . naproxen (NAPROSYN) 500 MG tablet Take 1 tablet (500 mg total) by mouth 2 (two) times daily with a meal.  20 tablet  0  . omeprazole (PRILOSEC) 20 MG capsule Take 20 mg by mouth every other day.       . Saline (SIMPLY SALINE) 0.9 % AERS Place 1  spray into the nose daily as needed. For nasal congestion      . verapamil (CALAN-SR) 180 MG CR tablet Take 180 mg by mouth every morning.        No current facility-administered medications for this visit.     Past Surgical History  Procedure Laterality Date  . Cardiac surgery    . Cardiac catheterization    . Colonoscopy    . Bilateral eye surgery    . Colonoscopy  05/14/2011    Procedure: COLONOSCOPY;  Surgeon: Corbin Ade, MD;  Location: [facility];  Service: Endoscopy;  Laterality: N/A;  8:15 AM     No Known Allergies    Family History  Problem Relation Age of Onset  . Colon cancer Neg Hx      Social History Mr. Kazmierski reports that he has quit smoking. He does not have any smokeless tobacco history on file. Mr. Degeorge reports that he drinks about 3.0 ounces of alcohol per week.   Review of Systems CONSTITUTIONAL: No weight loss, fever, chills, weakness or fatigue.  HEENT: Eyes: No visual loss, blurred vision, double vision or yellow sclerae.No hearing loss, sneezing, congestion, runny nose or sore throat.  SKIN: No rash or itching.  CARDIOVASCULAR:  per HPI RESPIRATORY: per HPI  GASTROINTESTINAL: No anorexia, nausea, vomiting or diarrhea. No abdominal pain or blood.  GENITOURINARY: No burning on urination, no polyuria NEUROLOGICAL: No headache, dizziness, syncope, paralysis, ataxia, numbness or tingling in the extremities. No change in bowel or bladder control.  MUSCULOSKELETAL: No muscle, back pain, joint pain or stiffness.  LYMPHATICS: No enlarged nodes. No history of splenectomy.  PSYCHIATRIC: No history of depression or anxiety.  ENDOCRINOLOGIC: No reports of sweating, cold or heat intolerance. No polyuria or polydipsia.  Marland Kitchen   Physical Examination p 71 bp 128/83 Wt 184 lbs BMI 29 Gen: resting comfortably, no acute distress HEENT: no scleral icterus, pupils equal round and reactive, no palptable cervical adenopathy,  CV: RRR, no m/r/g, no JVD, no  carotid bruits.  Resp: Clear to auscultation bilaterally GI: abdomen is soft, non-tender, non-distended, normal bowel sounds, no hepatosplenomegaly MSK: extremities are warm, no edema.  Skin: warm, no rash Neuro:  no focal deficits Psych: appropriate affect   Diagnostic Studies 06/2007 Cath  RESULTS: The left main coronary. The left main coronary was free of  significant disease.  The left anterior descending artery. The left anterior descending  artery gave rise to a diagonal Frank Novelo and septal perforator. There were  50% and 80% stenoses in the proximal LAD, with segmental disease  overlapping the diagonal Jaqulyn Chancellor. There was 80% ostial stenosis in the  diagonal Suzzane Quilter, which represented a bifurcation lesion.  The circumflex artery. The circumflex artery gave rise to three  posterolateral branches. These vessels were free of significant  disease.  The right coronary artery. The right coronary artery was a moderate-  sized vessel that gave rise to a right ventricular Rex Oesterle, a posterior  descending Meira Wahba, and a small posterolateral Tejas Seawood. There was 90%  stenosis in the proximal to midvessel, with some segmental disease.  The left ventriculogram. The left ventriculogram performed in the RAO  projection showed good wall motion. The estimated ejection fraction was  60%. 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  Following stenting of the lesion in the LAD, the stenosis improved from  80% to 0%.  Following PTCA of the diagonal Nancyann Cotterman, the stenosis improved from 80% to  a residual 50% following stent deployment in the LAD. This was treated  with angioplasty only.  The right coronary artery lesion. The right coronary artery lesion  improved from 90% to 0% using a PLATINUM drug-eluting stent.  CONCLUSION:  1. Coronary artery disease, with 80% stenosis in the proximal LAD, 80%  ostial stenosis in the first diagonal Linzy Darling, no significant  obstruction in the circumflex artery, and  90% stenosis in the  proximal to midright coronary artery, with normal LV function.  2. Successful PCI to the bifurcation lesion in the LAD diagonal  Aaliyah Gavel, with improvement in the LAD stenosis from 80% to 0% using a  Promus drug-eluting stent, and improvement in the diagonal lesion  from 80% to 50% using angioplasty alone.  3. Successful PCI of the lesion in the proximal to midright coronary  artery using a PLATINUM drug-eluting stent, with improvement in the  central narrowing from 90% to 0%.   stress MPI 05/2011: fixed defect inferior wall, possible artifact. LVEF 61%   01/21/13 Clinic EKG: sinus rhythm, normal axis, no ischemic changes  02/01/13 Echo: LVEF 60-65%, no WMAs, grade II diastolic dysfunction,   01/21/13: Exercise MPI: 7 minutes, 10 METs, 85% of THR, no ischemic ST changes  IMPRESSION: Low risk exercise Cardiolite. No chest pain reported, there  were no diagnostic ST segment changes at maximum work load of 10.1 METS. Perfusion imaging is most consistent with inferior wall attenuation, no definite scar or ischemic defects. LV volumes are normal with LVEF 65% and no focal wall motion abnormalities.    Assessment and Plan  1. Chest pain  - symptoms have improved since last visit - stress MPI with no evidence of ischemia, echo with normal LV systolic function and wall motion.  - continue secondary prevention and risk factor modification.    Antoine Poche, M.D., F.A.C.C.

## 2013-03-02 NOTE — Patient Instructions (Addendum)
Your physician wants you to follow-up in:  6 months. You will receive a reminder letter in the mail two months in advance. If you don't receive a letter, please call our office to schedule the follow-up appointment.   

## 2013-07-01 ENCOUNTER — Encounter (HOSPITAL_COMMUNITY): Payer: Self-pay | Admitting: Emergency Medicine

## 2013-07-01 ENCOUNTER — Emergency Department (HOSPITAL_COMMUNITY)
Admission: EM | Admit: 2013-07-01 | Discharge: 2013-07-01 | Disposition: A | Discharge status: Home / Self Care | Payer: BC Managed Care – PPO | Attending: Emergency Medicine | Admitting: Emergency Medicine

## 2013-07-01 DIAGNOSIS — M109 Gout, unspecified: Secondary | ICD-10-CM | POA: Insufficient documentation

## 2013-07-01 DIAGNOSIS — S61209A Unspecified open wound of unspecified finger without damage to nail, initial encounter: Secondary | ICD-10-CM | POA: Insufficient documentation

## 2013-07-01 DIAGNOSIS — Y939 Activity, unspecified: Secondary | ICD-10-CM | POA: Insufficient documentation

## 2013-07-01 DIAGNOSIS — W260XXA Contact with knife, initial encounter: Secondary | ICD-10-CM | POA: Insufficient documentation

## 2013-07-01 DIAGNOSIS — Y929 Unspecified place or not applicable: Secondary | ICD-10-CM | POA: Insufficient documentation

## 2013-07-01 DIAGNOSIS — Z9889 Other specified postprocedural states: Secondary | ICD-10-CM | POA: Insufficient documentation

## 2013-07-01 DIAGNOSIS — Z79899 Other long term (current) drug therapy: Secondary | ICD-10-CM | POA: Insufficient documentation

## 2013-07-01 DIAGNOSIS — W261XXA Contact with sword or dagger, initial encounter: Secondary | ICD-10-CM

## 2013-07-01 DIAGNOSIS — I2510 Atherosclerotic heart disease of native coronary artery without angina pectoris: Secondary | ICD-10-CM | POA: Insufficient documentation

## 2013-07-01 DIAGNOSIS — E7800 Pure hypercholesterolemia, unspecified: Secondary | ICD-10-CM | POA: Insufficient documentation

## 2013-07-01 DIAGNOSIS — K219 Gastro-esophageal reflux disease without esophagitis: Secondary | ICD-10-CM | POA: Insufficient documentation

## 2013-07-01 DIAGNOSIS — I252 Old myocardial infarction: Secondary | ICD-10-CM | POA: Insufficient documentation

## 2013-07-01 DIAGNOSIS — Z7982 Long term (current) use of aspirin: Secondary | ICD-10-CM | POA: Insufficient documentation

## 2013-07-01 DIAGNOSIS — Z87891 Personal history of nicotine dependence: Secondary | ICD-10-CM | POA: Insufficient documentation

## 2013-07-01 DIAGNOSIS — S61019A Laceration without foreign body of unspecified thumb without damage to nail, initial encounter: Secondary | ICD-10-CM

## 2013-07-01 DIAGNOSIS — I10 Essential (primary) hypertension: Secondary | ICD-10-CM | POA: Insufficient documentation

## 2013-07-01 MED ORDER — CEPHALEXIN 500 MG PO CAPS: 500.0000 mg | ORAL_CAPSULE | Freq: Three times a day (TID) | ORAL | Status: DC

## 2013-07-01 MED ORDER — LIDOCAINE HCL (PF) 1 % IJ SOLN
INTRAMUSCULAR | Status: AC
  Administered 2013-07-01: 17:00:00
  Filled 2013-07-01: qty 5

## 2013-07-01 MED ORDER — LIDOCAINE HCL (PF) 1 % IJ SOLN: 5.0000 mL | Freq: Once | INTRAMUSCULAR | Status: DC

## 2013-07-01 NOTE — ED Provider Notes (Signed)
CSN: [id number]     Arrival date & time 07/01/13  1500 History   First MD Initiated Contact with Patient 07/01/13 1556     Chief Complaint  Patient presents with  . Laceration     (Consider location/radiation/quality/duration/timing/severity/associated sxs/prior Treatment) Patient is a 63 y.o. male presenting with skin laceration. The history is provided by the patient.  Laceration Location:  Finger Finger laceration location:  L thumb Length (cm):  1.5 Depth:  Through dermis Quality comment:  Flap Bleeding: venous and controlled with pressure   Laceration mechanism:  Knife Pain details:    Quality:  Throbbing   Severity:  Mild   Timing:  Constant   Progression:  Unchanged Foreign body present:  No foreign bodies Relieved by:  Pressure Worsened by:  Movement Ineffective treatments:  None tried Tetanus status:  Up to date   Past Medical History  Diagnosis Date  . Coronary artery disease   . Myocardial infarction   . Gout   . Hypercholesteremia   . GERD (gastroesophageal reflux disease)   . Hypertension    Past Surgical History  Procedure Laterality Date  . Colonoscopy    . Bilateral eye surgery    . Colonoscopy  05/14/2011    Procedure: COLONOSCOPY;  Surgeon: Daneil Dolin, MD;  Location: [facility];  Service: Endoscopy;  Laterality: N/A;  8:15 AM  . Cardiac surgery    . Cardiac catheterization     Family History  Problem Relation Age of Onset  . Colon cancer Neg Hx    History  Substance Use Topics  . Smoking status: Former Smoker -- 0.25 packs/day for 3 years  . Smokeless tobacco: Not on file  . Alcohol Use: 3.0 oz/week    5 Cans of beer per week    Review of Systems  Constitutional: Negative for fever and chills.  Musculoskeletal: Negative for arthralgias, back pain and joint swelling.  Skin: Positive for wound.       Laceration   Neurological: Negative for dizziness, weakness and numbness.  Hematological: Does not bruise/bleed easily.  All other  systems reviewed and are negative.      Allergies  Review of patient's allergies indicates no known allergies.  Home Medications   Current Outpatient Rx  Name  Route  Sig  Dispense  Refill  . aspirin (ASPIRIN EC) 81 MG EC tablet   Oral   Take 81 mg by mouth every morning.          Marland Kitchen atorvastatin (LIPITOR) 10 MG tablet   Oral   Take 10 mg by mouth every evening.         . isosorbide mononitrate (IMDUR) 30 MG 24 hr tablet   Oral   Take 1 tablet (30 mg total) by mouth daily.   90 tablet   1   . lisinopril (PRINIVIL,ZESTRIL) 10 MG tablet   Oral   Take 1 tablet (10 mg total) by mouth daily.   90 tablet   3   . naproxen (NAPROSYN) 500 MG tablet   Oral   Take 1 tablet (500 mg total) by mouth 2 (two) times daily with a meal.   20 tablet   0   . omeprazole (PRILOSEC) 20 MG capsule   Oral   Take 20 mg by mouth every other day.          . Saline (SIMPLY SALINE) 0.9 % AERS   Nasal   Place 1 spray into the nose daily as needed. For nasal  congestion         . verapamil (CALAN-SR) 180 MG CR tablet   Oral   Take 180 mg by mouth every morning.           BP 152/95  Pulse 70  Temp(Src) 98.1 F (36.7 C) (Oral)  Resp 20  Ht 5' 6.5" (1.689 m)  Wt 184 lb (83.462 kg)  BMI 29.26 kg/m2  SpO2 96% Physical Exam  Nursing note and vitals reviewed. Constitutional: He is oriented to person, place, and time. He appears well-developed and well-nourished. No distress.  HENT:  Head: Normocephalic and atraumatic.  Cardiovascular: Normal rate, regular rhythm, normal heart sounds and intact distal pulses.   No murmur heard. Pulmonary/Chest: Effort normal and breath sounds normal. No respiratory distress.  Musculoskeletal: Normal range of motion. He exhibits no edema.       Left hand: He exhibits tenderness and laceration. He exhibits normal range of motion, no bony tenderness, normal two-point discrimination, normal capillary refill, no deformity and no swelling. Normal  sensation noted. Normal strength noted.       Hands: Neurological: He is alert and oriented to person, place, and time. He exhibits normal muscle tone. Coordination normal.  Skin: Skin is warm.    ED Course  Procedures (including critical care time) Labs Review Labs Reviewed - No data to display Imaging Review No results found.   EKG Interpretation None      LACERATION REPAIR Performed by: Lanique Gonzalo L. Authorized by: Hale Bogus Consent: Verbal consent obtained. Risks and benefits: risks, benefits and alternatives were discussed Consent given by: patient Patient identity confirmed: provided demographic data Prepped and Draped in normal sterile fashion Wound explored  Laceration Location: left thumb Laceration Length: 1.5 cm  No Foreign Bodies seen or palpated  Anesthesia: local infiltration  Local anesthetic: lidocaine 1% w/o epinephrine  Anesthetic total: 2 ml  Irrigation method: syringe Amount of cleaning: standard  Subcuticular closure:  1 suture of 6-0 vicryl, bleeding controlled Skin closure: 4-0 prolene Number of sutures: 3  Technique: simple interrupted Patient tolerance: Patient tolerated the procedure well with no immediate complications.   MDM   Final diagnoses:  Thumb laceration    Bleeding controlled with one subcuticular suture,  No FB, muscle, nerve or tendon injury seen .  Remains NV intact.  Finger bandaged.  Pt agrees to return here for any signs of infection, sutures out in 10 days.  Pt verbalized understanding and agrees to plan      Paolo Okane L. Vanessa St. Ansgar, PA-C 07/02/13 2141

## 2013-07-01 NOTE — ED Notes (Signed)
Lac to lt base of thumb , cut with pocket knife.

## 2013-07-01 NOTE — Discharge Instructions (Signed)
Laceration Care, Adult °A laceration is a cut that goes through all layers of the skin. The cut goes into the tissue beneath the skin. °HOME CARE °For stitches (sutures) or staples: °· Keep the cut clean and dry. °· If you have a bandage (dressing), change it at least once a day. Change the bandage if it gets wet or dirty, or as told by your doctor. °· Wash the cut with soap and water 2 times a day. Rinse the cut with water. Pat it dry with a clean towel. °· Put a thin layer of medicated cream on the cut as told by your doctor. °· You may shower after the first 24 hours. Do not soak the cut in water until the stitches are removed. °· Only take medicines as told by your doctor. °· Have your stitches or staples removed as told by your doctor. °For skin adhesive strips: °· Keep the cut clean and dry. °· Do not get the strips wet. You may take a bath, but be careful to keep the cut dry. °· If the cut gets wet, pat it dry with a clean towel. °· The strips will fall off on their own. Do not remove the strips that are still stuck to the cut. °For wound glue: °· You may shower or take baths. Do not soak or scrub the cut. Do not swim. Avoid heavy sweating until the glue falls off on its own. After a shower or bath, pat the cut dry with a clean towel. °· Do not put medicine on your cut until the glue falls off. °· If you have a bandage, do not put tape over the glue. °· Avoid lots of sunlight or tanning lamps until the glue falls off. Put sunscreen on the cut for the first year to reduce your scar. °· The glue will fall off on its own. Do not pick at the glue. °You may need a tetanus shot if: °· You cannot remember when you had your last tetanus shot. °· You have never had a tetanus shot. °If you need a tetanus shot and you choose not to have one, you may get tetanus. Sickness from tetanus can be serious. °GET HELP RIGHT AWAY IF:  °· Your pain does not get better with medicine. °· Your arm, hand, leg, or foot loses feeling  (numbness) or changes color. °· Your cut is bleeding. °· Your joint feels weak, or you cannot use your joint. °· You have painful lumps on your body. °· Your cut is red, puffy (swollen), or painful. °· You have a red line on the skin near the cut. °· You have yellowish-white fluid (pus) coming from the cut. °· You have a fever. °· You have a bad smell coming from the cut or bandage. °· Your cut breaks open before or after stitches are removed. °· You notice something coming out of the cut, such as wood or glass. °· You cannot move a finger or toe. °MAKE SURE YOU:  °· Understand these instructions. °· Will watch your condition. °· Will get help right away if you are not doing well or get worse. °Document Released: 09/10/2007 Document Revised: 06/16/2011 Document Reviewed: 09/17/2010 °[employer]® Patient Information ©2014 [employer]. ° ° ° ° °

## 2013-07-04 NOTE — ED Provider Notes (Signed)
Medical screening examination/treatment/procedure(s) were performed by non-physician practitioner and as supervising physician I was immediately available for consultation/collaboration.   EKG Interpretation None        Juleen Sorrels B. Karle Starch, MD 07/04/13 1315

## 2013-07-05 ENCOUNTER — Encounter: Payer: Self-pay | Admitting: Cardiology

## 2013-07-18 ENCOUNTER — Telehealth: Payer: Self-pay | Admitting: Cardiology

## 2013-07-18 MED ORDER — ISOSORBIDE MONONITRATE ER 30 MG PO TB24: 30.0000 mg | ORAL_TABLET | Freq: Every day | ORAL | Status: DC

## 2013-07-18 NOTE — Telephone Encounter (Signed)
Received fax refill request  Rx # [id number] Medication:  Imdur 30 mg ER tab Qty 90 Sig:  Take one tablet by mouth once daily Physician:  Harl Bowie

## 2013-07-18 NOTE — Telephone Encounter (Signed)
Medication sent via escribe.  

## 2013-08-22 ENCOUNTER — Ambulatory Visit (INDEPENDENT_AMBULATORY_CARE_PROVIDER_SITE_OTHER): Payer: BC Managed Care – PPO | Admitting: Cardiology

## 2013-08-22 ENCOUNTER — Encounter: Payer: Self-pay | Admitting: Cardiology

## 2013-08-22 VITALS — BP 114/84 | HR 62 | Ht 66.0 in | Wt 185.0 lb

## 2013-08-22 DIAGNOSIS — I2510 Atherosclerotic heart disease of native coronary artery without angina pectoris: Secondary | ICD-10-CM

## 2013-08-22 DIAGNOSIS — E785 Hyperlipidemia, unspecified: Secondary | ICD-10-CM

## 2013-08-22 DIAGNOSIS — I10 Essential (primary) hypertension: Secondary | ICD-10-CM

## 2013-08-22 MED ORDER — LOSARTAN POTASSIUM 50 MG PO TABS: 50.0000 mg | ORAL_TABLET | Freq: Every day | ORAL | Status: DC

## 2013-08-22 NOTE — Patient Instructions (Addendum)
Your physician wants you to follow-up in:  6 months You will receive a reminder letter in the mail two months in advance. If you don't receive a letter, please call our office to schedule the follow-up appointment.   Your physician has recommended you make the following change in your medication:    STOP Lisinopril  START Losartan  50 mg daily   Please keep daily BP log     Thank you for choosing [facility] !

## 2013-08-22 NOTE — Progress Notes (Signed)
Clinical Summary Mr. Hase is a 63 y.o.male seen today for follow up of the following medical problems.   1. CAD  - cath 06/2007 in the setting of unstable angina, PCI with DES to LAD,PTCA to diagonal, and DES to RCA LV gram LVEF 60%  - 01/2013 Stress MPI low risk study, no ischemia - 01/2013 echo LVEF 60-65%, grade II diastolic dysfunction - denies any significant chest pain. No significant SOB or DOE since last visit - compliant with meds.   2. HTN - checks bp occasionally, typically 140s/90-100s - compliant with meds. Noted cough after starting lisinopril a few months ago that has not resolved.   3. Hyperlipidemia 04/2013 TC 115 TG 107 HDL 34 LDL 62 - compliant with statin  Past Medical History  Diagnosis Date  . Coronary artery disease   . Myocardial infarction   . Gout   . Hypercholesteremia   . GERD (gastroesophageal reflux disease)   . Hypertension      No Known Allergies   Current Outpatient Prescriptions  Medication Sig Dispense Refill  . aspirin (ASPIRIN EC) 81 MG EC tablet Take 81 mg by mouth every morning.       Marland Kitchen atorvastatin (LIPITOR) 10 MG tablet Take 10 mg by mouth every evening.      . cephALEXin (KEFLEX) 500 MG capsule Take 1 capsule (500 mg total) by mouth 3 (three) times daily.  21 capsule  0  . isosorbide mononitrate (IMDUR) 30 MG 24 hr tablet Take 1 tablet (30 mg total) by mouth daily.  90 tablet  3  . lisinopril (PRINIVIL,ZESTRIL) 10 MG tablet Take 1 tablet (10 mg total) by mouth daily.  90 tablet  3  . naproxen (NAPROSYN) 500 MG tablet Take 1 tablet (500 mg total) by mouth 2 (two) times daily with a meal.  20 tablet  0  . omeprazole (PRILOSEC) 20 MG capsule Take 20 mg by mouth every other day.       . Saline (SIMPLY SALINE) 0.9 % AERS Place 1 spray into the nose daily as needed. For nasal congestion      . verapamil (CALAN-SR) 180 MG CR tablet Take 180 mg by mouth every morning.        No current facility-administered medications for this  visit.     Past Surgical History  Procedure Laterality Date  . Colonoscopy    . Bilateral eye surgery    . Colonoscopy  05/14/2011    Procedure: COLONOSCOPY;  Surgeon: Daneil Dolin, MD;  Location: [facility];  Service: Endoscopy;  Laterality: N/A;  8:15 AM  . Cardiac surgery    . Cardiac catheterization       No Known Allergies    Family History  Problem Relation Age of Onset  . Colon cancer Neg Hx      Social History Mr. Krauser reports that he has quit smoking. He does not have any smokeless tobacco history on file. Mr. Pella reports that he drinks about 3 ounces of alcohol per week.   Review of Systems CONSTITUTIONAL: No weight loss, fever, chills, weakness or fatigue.  HEENT: Eyes: No visual loss, blurred vision, double vision or yellow sclerae.No hearing loss, sneezing, congestion, runny nose or sore throat.  SKIN: No rash or itching.  CARDIOVASCULAR: per HPI RESPIRATORY: +cough GASTROINTESTINAL: No anorexia, nausea, vomiting or diarrhea. No abdominal pain or blood.  GENITOURINARY: No burning on urination, no polyuria NEUROLOGICAL: No headache, dizziness, syncope, paralysis, ataxia, numbness or tingling in  the extremities. No change in bowel or bladder control.  MUSCULOSKELETAL: No muscle, back pain, joint pain or stiffness.  LYMPHATICS: No enlarged nodes. No history of splenectomy.  PSYCHIATRIC: No history of depression or anxiety.  ENDOCRINOLOGIC: No reports of sweating, cold or heat intolerance. No polyuria or polydipsia.  Marland Kitchen   Physical Examination p 62 bp 114/84 Wt 185 lbs BMI 30 Gen: resting comfortably, no acute distress HEENT: no scleral icterus, pupils equal round and reactive, no palptable cervical adenopathy,  CV: RRR, no m/r/g, no JVD, no carotid bruits Resp: Clear to auscultation bilaterally GI: abdomen is soft, non-tender, non-distended, normal bowel sounds, no hepatosplenomegaly MSK: extremities are warm, no edema.  Skin: warm, no  rash Neuro:  no focal deficits Psych: appropriate affect   Diagnostic Studies  01/2013 Stress MPI Analysis of the raw perfusion data finds radiotracer uptake in the gut adjacent to the inferior wall.  Tomographic views were obtained using the short axis, vertical long axis, and horizontal long axis planes. There is a small, mild to moderate intensity, inferior perfusion defect that is fixed. No significant reversible defects to indicate ischemia.  Gated imaging reveals an EDV of 70, ESV of 24, normal TID ratio of 0.89, and LVEF of 65% without wall motion abnormality.  IMPRESSION: Low risk exercise Cardiolite. No chest pain reported, there were no diagnostic ST segment changes at maximum work load of 10.1 METS. Perfusion imaging is most consistent with inferior wall attenuation, no definite scar or ischemic defects. LV volumes are normal with LVEF 65% and no focal wall motion abnormalities.    Assessment and Plan  1. CAD - no current symptoms - continue secondary prevention and risk factor modification.  2. HTN - home numbers slightly above goal. Given cough, will stop lisinopril and start losartan 50mg  daily. - he will keep bp log   3. Hyperlipidemia - at goal, continue current statin  F/u 6 months      Arnoldo Lenis, M.D., F.A.C.C.

## 2014-02-15 ENCOUNTER — Ambulatory Visit (INDEPENDENT_AMBULATORY_CARE_PROVIDER_SITE_OTHER): Payer: BC Managed Care – PPO | Admitting: Cardiology

## 2014-02-15 ENCOUNTER — Encounter: Payer: Self-pay | Admitting: Cardiology

## 2014-02-15 VITALS — BP 130/98 | Ht 66.0 in | Wt 186.0 lb

## 2014-02-15 DIAGNOSIS — I2510 Atherosclerotic heart disease of native coronary artery without angina pectoris: Secondary | ICD-10-CM

## 2014-02-15 DIAGNOSIS — E785 Hyperlipidemia, unspecified: Secondary | ICD-10-CM

## 2014-02-15 DIAGNOSIS — I10 Essential (primary) hypertension: Secondary | ICD-10-CM

## 2014-02-15 MED ORDER — HYDROCHLOROTHIAZIDE 12.5 MG PO CAPS: 12.5000 mg | ORAL_CAPSULE | Freq: Every day | ORAL | Status: DC

## 2014-02-15 NOTE — Progress Notes (Signed)
Clinical Summary Mr. Baines is a 63 y.o.male seen today for follow up of the following medical problems.   1. CAD  - cath 06/2007 in the setting of unstable angina, PCI with DES to LAD,PTCA to diagonal, and DES to RCA LV gram LVEF 60%  - 01/2013 Stress MPI low risk study, no ischemia - 01/2013 echo LVEF 60-65%, grade II diastolic dysfunction   - since last visit he denies any significant chest pain, no SOB or DOE.  - compliant with meds.    2. HTN - checks bp occasionally, typically 130s-160s/90-100s.  - reports pcp recently increased losartan to 100mg  daily however bp has not achieved goa.  3. Hyperlipidemia - last lipid panel 04/2013 TC 115 TG 107 HDL 34 LDL 62 - compliant with statin, prior mylagias on lipitor 20mg  daily, tolerates lipitor 10mg  daily.   Past Medical History  Diagnosis Date  . Coronary artery disease   . Myocardial infarction   . Gout   . Hypercholesteremia   . GERD (gastroesophageal reflux disease)   . Hypertension      No Known Allergies   Current Outpatient Prescriptions  Medication Sig Dispense Refill  . aspirin (ASPIRIN EC) 81 MG EC tablet Take 81 mg by mouth every morning.     Marland Kitchen atorvastatin (LIPITOR) 10 MG tablet Take 10 mg by mouth every evening.    . etodolac (LODINE) 400 MG tablet Take 400 mg by mouth as needed.    . isosorbide mononitrate (IMDUR) 30 MG 24 hr tablet Take 1 tablet (30 mg total) by mouth daily. 90 tablet 3  . losartan (COZAAR) 50 MG tablet Take 1 tablet (50 mg total) by mouth daily. 90 tablet 3  . naproxen (NAPROSYN) 500 MG tablet Take 500 mg by mouth as needed.    Marland Kitchen omeprazole (PRILOSEC) 20 MG capsule Take 20 mg by mouth every other day.     . verapamil (CALAN-SR) 180 MG CR tablet Take 180 mg by mouth every morning.      No current facility-administered medications for this visit.     Past Surgical History  Procedure Laterality Date  . Colonoscopy    . Bilateral eye surgery    . Colonoscopy  05/14/2011   Procedure: COLONOSCOPY;  Surgeon: Daneil Dolin, MD;  Location: [facility];  Service: Endoscopy;  Laterality: N/A;  8:15 AM  . Cardiac surgery    . Cardiac catheterization       No Known Allergies    Family History  Problem Relation Age of Onset  . Colon cancer Neg Hx      Social History Mr. Meininger reports that he has quit smoking. He does not have any smokeless tobacco history on file. Mr. Steck reports that he drinks about 3.0 oz of alcohol per week.   Review of Systems CONSTITUTIONAL: No weight loss, fever, chills, weakness or fatigue.  HEENT: Eyes: No visual loss, blurred vision, double vision or yellow sclerae.No hearing loss, sneezing, congestion, runny nose or sore throat.  SKIN: No rash or itching.  CARDIOVASCULAR: per HPI RESPIRATORY: No shortness of breath, cough or sputum.  GASTROINTESTINAL: No anorexia, nausea, vomiting or diarrhea. No abdominal pain or blood.  GENITOURINARY: No burning on urination, no polyuria NEUROLOGICAL: No headache, dizziness, syncope, paralysis, ataxia, numbness or tingling in the extremities. No change in bowel or bladder control.  MUSCULOSKELETAL: No muscle, back pain, joint pain or stiffness.  LYMPHATICS: No enlarged nodes. No history of splenectomy.  PSYCHIATRIC: No history of  depression or anxiety.  ENDOCRINOLOGIC: No reports of sweating, cold or heat intolerance. No polyuria or polydipsia.  Marland Kitchen   Physical Examination bp 130/98 Wt 186 lbs BMI 30 Gen: resting comfortably, no acute distress HEENT: no scleral icterus, pupils equal round and reactive, no palptable cervical adenopathy,  CV: RRR, no m/r/g, no JVD, no carotid bruits Resp: Clear to auscultation bilaterally GI: abdomen is soft, non-tender, non-distended, normal bowel sounds, no hepatosplenomegaly MSK: extremities are warm, no edema.  Skin: warm, no rash Neuro:  no focal deficits Psych: appropriate affect   Diagnostic Studies 01/2013 Stress MPI Analysis of the  raw perfusion data finds radiotracer uptake in the gut adjacent to the inferior wall.  Tomographic views were obtained using the short axis, vertical long axis, and horizontal long axis planes. There is a small, mild to moderate intensity, inferior perfusion defect that is fixed. No significant reversible defects to indicate ischemia.  Gated imaging reveals an EDV of 70, ESV of 24, normal TID ratio of 0.89, and LVEF of 65% without wall motion abnormality.  IMPRESSION: Low risk exercise Cardiolite. No chest pain reported, there were no diagnostic ST segment changes at maximum work load of 10.1 METS. Perfusion imaging is most consistent with inferior wall attenuation, no definite scar or ischemic defects. LV volumes are normal with LVEF 65% and no focal wall motion abnormalities.  01/2013 Echo Study Conclusions  - Left ventricle: The cavity size was normal. There was mild concentric hypertrophy. Systolic function was normal. The estimated ejection fraction was in the range of 60% to 65%. Wall motion was normal; there were no regional wall motion abnormalities. Features are consistent with a pseudonormal left ventricular filling pattern, with concomitant abnormal relaxation and increased filling pressure (grade 2 diastolic dysfunction). - Mitral valve: Trivial regurgitation. - Left atrium: The atrium was at the upper limits of normal in size. - Tricuspid valve: Physiologic regurgitation. - Pulmonary arteries: Systolic pressure could not be accurately estimated. - Inferior vena cava: Poorly visualized. Unable to estimate CVP. - Pericardium, extracardiac: There was no pericardial effusion. Impressions:  - Comparison to prior study March 2009. Mild LVH with LVEF 08-14%, grade 2 diastolic dysfunction. Upper normal left atrial size. Trivial mitral and tricuspid regurgitation. Unable to assess PASP or CVP.  02/15/14 Clinic EKG NSR  Assessment and  Plan  1. CAD - no current symptoms - continue secondary prevention and risk factor modification. - ekg reviewed today, NSR with no ischemic changes  2. HTN - above goal, start HCTZ 12.5mg  daily with BMET in 2 weeks. He will submit a bp log in 2 weeks  3. Hyperlipidemia - at goal, continue current statin. Prior myalgias on higher dose statin    F/u 6 months  Arnoldo Lenis, M.D.

## 2014-02-15 NOTE — Patient Instructions (Signed)
Your physician wants you to follow-up in: 6 months with Dr. Bryna Colander will receive a reminder letter in the mail two months in advance. If you don't receive a letter, please call our office to schedule the follow-up appointment.  Your physician has recommended you make the following change in your medication:   START HCTZ 12.5 MG DAILY  Your physician recommends that you return for lab work in [place]  Your physician has requested that you regularly monitor and record your blood pressure readings at home FOR 2 WEEKS. Please use the same machine at the same time of day to check your readings and record them to bring to your follow-up visit.  BRING YOUR BLOOD PRESSURE READING TO THE OFFICE IN 2 WEEKS.  Thank you for choosing [facility]!!

## 2014-03-01 ENCOUNTER — Telehealth: Payer: Self-pay | Admitting: *Deleted

## 2014-03-01 NOTE — Telephone Encounter (Signed)
Pt brought in BP log. In Dr. Devra Dopp folder for review

## 2014-03-02 LAB — BASIC METABOLIC PANEL WITH GFR
BUN: 17 mg/dL (ref 6–23)
CALCIUM: 9.2 mg/dL (ref 8.4–10.5)
CO2: 24 meq/L (ref 19–32)
Chloride: 106 mEq/L (ref 96–112)
Creat: 0.82 mg/dL (ref 0.50–1.35)
GFR, Est African American: >89 mL/min
GFR, Est Non African American: >89 mL/min
Glucose, Bld: 95 mg/dL (ref 70–99)
Potassium: 4.1 mEq/L (ref 3.5–5.3)
Sodium: 139 mEq/L (ref 135–145)

## 2014-03-02 LAB — MAGNESIUM: MAGNESIUM: 2 mg/dL (ref 1.5–2.5)

## 2014-03-06 ENCOUNTER — Telehealth: Payer: Self-pay | Admitting: *Deleted

## 2014-03-06 NOTE — Telephone Encounter (Signed)
Pt wife aware, forwarded to Dr. Willey Blade

## 2014-03-06 NOTE — Telephone Encounter (Signed)
-----   Message from Arnoldo Lenis, MD sent at 03/06/2014 10:16 AM EST ----- Labs look good  Zandra Abts MD

## 2014-04-27 ENCOUNTER — Telehealth: Payer: Self-pay | Admitting: *Deleted

## 2014-04-27 ENCOUNTER — Emergency Department (HOSPITAL_COMMUNITY): Payer: BLUE CROSS/BLUE SHIELD

## 2014-04-27 ENCOUNTER — Observation Stay (HOSPITAL_COMMUNITY)
Admission: EM | Admit: 2014-04-27 | Discharge: 2014-04-28 | Disposition: A | Discharge status: Home / Self Care | Payer: BLUE CROSS/BLUE SHIELD | Attending: Internal Medicine | Admitting: Internal Medicine

## 2014-04-27 ENCOUNTER — Encounter (HOSPITAL_COMMUNITY): Payer: Self-pay

## 2014-04-27 DIAGNOSIS — E780 Pure hypercholesterolemia: Secondary | ICD-10-CM | POA: Diagnosis not present

## 2014-04-27 DIAGNOSIS — Z9889 Other specified postprocedural states: Secondary | ICD-10-CM | POA: Insufficient documentation

## 2014-04-27 DIAGNOSIS — Z9861 Coronary angioplasty status: Secondary | ICD-10-CM | POA: Insufficient documentation

## 2014-04-27 DIAGNOSIS — R079 Chest pain, unspecified: Secondary | ICD-10-CM | POA: Diagnosis present

## 2014-04-27 DIAGNOSIS — I252 Old myocardial infarction: Secondary | ICD-10-CM | POA: Insufficient documentation

## 2014-04-27 DIAGNOSIS — R0602 Shortness of breath: Secondary | ICD-10-CM | POA: Diagnosis not present

## 2014-04-27 DIAGNOSIS — R739 Hyperglycemia, unspecified: Secondary | ICD-10-CM | POA: Diagnosis present

## 2014-04-27 DIAGNOSIS — Z79899 Other long term (current) drug therapy: Secondary | ICD-10-CM | POA: Insufficient documentation

## 2014-04-27 DIAGNOSIS — Z7982 Long term (current) use of aspirin: Secondary | ICD-10-CM | POA: Diagnosis not present

## 2014-04-27 DIAGNOSIS — I2510 Atherosclerotic heart disease of native coronary artery without angina pectoris: Secondary | ICD-10-CM

## 2014-04-27 DIAGNOSIS — M109 Gout, unspecified: Secondary | ICD-10-CM | POA: Diagnosis not present

## 2014-04-27 DIAGNOSIS — I10 Essential (primary) hypertension: Secondary | ICD-10-CM

## 2014-04-27 DIAGNOSIS — K219 Gastro-esophageal reflux disease without esophagitis: Secondary | ICD-10-CM | POA: Diagnosis present

## 2014-04-27 LAB — CBC WITH DIFFERENTIAL/PLATELET
BASOS ABS: 0 10*3/uL (ref 0.0–0.1)
BASOS PCT: 0 % (ref 0–1)
EOS ABS: 0.4 10*3/uL (ref 0.0–0.7)
EOS PCT: 5 % (ref 0–5)
HCT: 41.8 % (ref 39.0–52.0)
HEMOGLOBIN: 14.7 g/dL (ref 13.0–17.0)
LYMPHS PCT: 22 % (ref 12–46)
Lymphs Abs: 1.6 10*3/uL (ref 0.7–4.0)
MCH: 32.4 pg (ref 26.0–34.0)
MCHC: 35.2 g/dL (ref 30.0–36.0)
MCV: 92.1 fL (ref 78.0–100.0)
MONOS PCT: 6 % (ref 3–12)
Monocytes Absolute: 0.5 10*3/uL (ref 0.1–1.0)
NEUTROS PCT: 67 % (ref 43–77)
Neutro Abs: 5.1 10*3/uL (ref 1.7–7.7)
PLATELETS: 154 10*3/uL (ref 150–400)
RBC: 4.54 MIL/uL (ref 4.22–5.81)
RDW: 13 % (ref 11.5–15.5)
WBC: 7.6 10*3/uL (ref 4.0–10.5)

## 2014-04-27 LAB — BASIC METABOLIC PANEL
Anion gap: 8 (ref 5–15)
BUN: 18 mg/dL (ref 6–23)
CHLORIDE: 105 meq/L (ref 96–112)
CO2: 25 mmol/L (ref 19–32)
Calcium: 9.1 mg/dL (ref 8.4–10.5)
Creatinine, Ser: 0.98 mg/dL (ref 0.50–1.35)
GFR calc Af Amer: >90 mL/min (ref >90)
GFR calc non Af Amer: 85 mL/min — ABNORMAL LOW (ref >90)
Glucose, Bld: 145 mg/dL — ABNORMAL HIGH (ref 70–99)
POTASSIUM: 3.6 mmol/L (ref 3.5–5.1)
Sodium: 138 mmol/L (ref 135–145)

## 2014-04-27 LAB — LIPID PANEL
Cholesterol: 129 mg/dL (ref 0–200)
HDL: 37 mg/dL — ABNORMAL LOW (ref >39)
LDL CALC: 63 mg/dL (ref 0–99)
TRIGLYCERIDES: 143 mg/dL (ref <150)
Total CHOL/HDL Ratio: 3.5 RATIO
VLDL: 29 mg/dL (ref 0–40)

## 2014-04-27 LAB — TROPONIN I
Troponin I: <0.03 ng/mL (ref <0.031)
Troponin I: <0.03 ng/mL (ref <0.031)

## 2014-04-27 MED ORDER — SODIUM CHLORIDE 0.9 % IV SOLN: 250.0000 mL | INTRAVENOUS | Status: DC | PRN

## 2014-04-27 MED ORDER — SODIUM CHLORIDE 0.9 % IJ SOLN: 3.0000 mL | INTRAMUSCULAR | Status: DC | PRN

## 2014-04-27 MED ORDER — SODIUM CHLORIDE 0.9 % IJ SOLN
3.0000 mL | Freq: Two times a day (BID) | INTRAMUSCULAR | Status: DC
  Administered 2014-04-27: 3 mL via INTRAVENOUS

## 2014-04-27 MED ORDER — POLYETHYLENE GLYCOL 3350 17 G PO PACK: 17.0000 g | PACK | Freq: Every day | ORAL | Status: DC | PRN

## 2014-04-27 MED ORDER — MAGNESIUM CITRATE PO SOLN: 1.0000 | Freq: Once | ORAL | Status: AC | PRN

## 2014-04-27 MED ORDER — PANTOPRAZOLE SODIUM 40 MG PO TBEC: 40.0000 mg | DELAYED_RELEASE_TABLET | Freq: Every day | ORAL | Status: DC

## 2014-04-27 MED ORDER — TRAZODONE HCL 50 MG PO TABS: 25.0000 mg | ORAL_TABLET | Freq: Every evening | ORAL | Status: DC | PRN

## 2014-04-27 MED ORDER — ASPIRIN EC 325 MG PO TBEC: 325.0000 mg | DELAYED_RELEASE_TABLET | Freq: Every day | ORAL | Status: DC

## 2014-04-27 MED ORDER — ASPIRIN 81 MG PO CHEW
324.0000 mg | CHEWABLE_TABLET | Freq: Once | ORAL | Status: AC
  Administered 2014-04-27: 324 mg via ORAL
  Filled 2014-04-27: qty 4

## 2014-04-27 MED ORDER — VERAPAMIL HCL ER 180 MG PO TBCR: 180.0000 mg | EXTENDED_RELEASE_TABLET | Freq: Every morning | ORAL | Status: DC

## 2014-04-27 MED ORDER — ISOSORBIDE MONONITRATE ER 60 MG PO TB24
30.0000 mg | ORAL_TABLET | Freq: Every day | ORAL | Status: DC
  Administered 2014-04-27: 30 mg via ORAL
  Filled 2014-04-27: qty 1

## 2014-04-27 MED ORDER — MORPHINE SULFATE 2 MG/ML IJ SOLN: 1.0000 mg | INTRAMUSCULAR | Status: DC | PRN

## 2014-04-27 MED ORDER — ACETAMINOPHEN 650 MG RE SUPP
650.0000 mg | Freq: Four times a day (QID) | RECTAL | Status: DC | PRN
Start: 2014-04-27 — End: 2014-04-28

## 2014-04-27 MED ORDER — ONDANSETRON HCL 4 MG/2ML IJ SOLN: 4.0000 mg | Freq: Four times a day (QID) | INTRAMUSCULAR | Status: DC | PRN

## 2014-04-27 MED ORDER — ATORVASTATIN CALCIUM 20 MG PO TABS
20.0000 mg | ORAL_TABLET | Freq: Every day | ORAL | Status: DC
  Administered 2014-04-27: 20 mg via ORAL
  Filled 2014-04-27: qty 1

## 2014-04-27 MED ORDER — ONDANSETRON HCL 4 MG PO TABS: 4.0000 mg | ORAL_TABLET | Freq: Four times a day (QID) | ORAL | Status: DC | PRN

## 2014-04-27 MED ORDER — LOSARTAN POTASSIUM 50 MG PO TABS: 100.0000 mg | ORAL_TABLET | Freq: Every day | ORAL | Status: DC

## 2014-04-27 MED ORDER — BISACODYL 5 MG PO TBEC: 5.0000 mg | DELAYED_RELEASE_TABLET | Freq: Every day | ORAL | Status: DC | PRN

## 2014-04-27 MED ORDER — ENOXAPARIN SODIUM 40 MG/0.4ML ~~LOC~~ SOLN
40.0000 mg | SUBCUTANEOUS | Status: DC
  Administered 2014-04-27: 40 mg via SUBCUTANEOUS
  Filled 2014-04-27: qty 0.4

## 2014-04-27 MED ORDER — ALUM & MAG HYDROXIDE-SIMETH 200-200-20 MG/5ML PO SUSP: 30.0000 mL | Freq: Four times a day (QID) | ORAL | Status: DC | PRN

## 2014-04-27 MED ORDER — NITROGLYCERIN 0.4 MG SL SUBL: 0.4000 mg | SUBLINGUAL_TABLET | SUBLINGUAL | Status: DC | PRN

## 2014-04-27 MED ORDER — HYDROCODONE-ACETAMINOPHEN 5-325 MG PO TABS: 1.0000 | ORAL_TABLET | ORAL | Status: DC | PRN

## 2014-04-27 MED ORDER — ACETAMINOPHEN 325 MG PO TABS: 650.0000 mg | ORAL_TABLET | Freq: Four times a day (QID) | ORAL | Status: DC | PRN

## 2014-04-27 NOTE — ED Notes (Signed)
Pt reports had a "hard" chest pain yesterday morning while talking on the phone around 0700.  Reports since then has felt weak and sob.  Pt says was able to do what he needed to do yesterday but still feeling weak, sob, and has "light" chest pain.

## 2014-04-27 NOTE — Telephone Encounter (Signed)
Patient called and states that on 04-26-14 at 0730 he experienced chest pain. Patient states that the pain was located in the center of the chest and lasted only " a few seconds". Patient states that the pain radiated out all over. Patient reports having SOB now and not feeling well after that and now. Patient advised to be seen and the ED.

## 2014-04-27 NOTE — ED Notes (Signed)
Patient agreeable to stay, EDP aware.

## 2014-04-27 NOTE — H&P (Deleted)
Patient seen, independently examined and chart reviewed. I agree with exam, assessment and plan discussed with Dyanne Carrel, NP.  64 year old man with history of MI 1998, coronary stent placement approximately 2009 presented to the emergency department with history of chest pain beginning 1/20. He had sharp intense pain across his chest that was transient in nature 1/20. Today he had intermittent recurrent chest pressure located in the center of his chest with associated shortness of breath but no diaphoresis, pain in his left arm neck or jaw. Prior to yesterday he noted any recent chest pain.  In the emergency department afebrile, VSS, no hypoxia. Basic metabolic panel, troponin, CBC, chest x-ray and EKG unremarkable.  Currently he is pain-free, no shortness of breath, no complaints.  PMH Coronary artery disease, MI, hyperlipidemia, hypertension  Objective: afebrile, VSS General:  Appears comfortable, calm. Cardiovascular: Regular rate and rhythm, no murmur, rub or gallop. No lower extremity edema. Respiratory: Clear to auscultation bilaterally, no wheezes, rales or rhonchi. Normal respiratory effort. Musculoskeletal: grossly normal tone bilateral upper and lower extremities Psychiatric: grossly normal mood and affect, speech fluent and appropriate Neurologic: grossly non-focal.   EKG sinus rhythm, no acute changes.  Chest x-ray no acute disease.  BMP and CBC unremarkable  Troponin negative  Plan to admit for observation, rule out ACS. Currently pain-free, initial troponin and EKG reassuring. Some typical symptoms. Plan on cardiology consultation for further recommendations. Of note she did have a low risk nuclear study 2014. Monitor on telemetry overnight. Discussed in detail with patient and wife at bedside.  Murray Hodgkins, MD  [facility] Pager [phone] 04/27/2014, 1:57 PM

## 2014-04-27 NOTE — Telephone Encounter (Signed)
With his extensive cardiac history agree with him being evaluated in ER   Zandra Abts MD

## 2014-04-27 NOTE — H&P (Signed)
[facility] History and Physical  Dennis [name] YQI:[id number] DOB: 07/09/1950 DOA: 04/27/2014  Referring physician:  PCP: [name], MD   Chief Complaint: chest pain  HPI: Dennis [name] is a 64 y.o. male with a past medical history that includes CAD, MI status post stents, hypertension, hyperlipidemia, presents to the emergency department with the chief complaint of intermittent chest pain. Patient reports yesterday morning he had a brief episode of sharp chest pain originated center of his chest and radiated outward to his ribs. This episode lasted only a few seconds. He states he's never had chest discomfort like that before. Since then he has experienced intermittent left anterior chest pains that are similar to the chest pains he's had in the past. He reports these episodes last only a few seconds as well. Estimates experiencing them about once an hour. The pains do not radiate he denies nausea vomiting headache dizziness syncope or near-syncope. The pain is not effected by movement, deep breathing or eating.  He does endorse some shortness of breath. Denies palpitations, LE edema or orthopnea. He reports that when these episodes continued he decided to call his cardiologist office who recommended he come to the emergency department for evaluation.   In the emergency department he is afebrile hemodynamically stable with no hypoxia. Workup includes a basic metabolic panel, troponin, CBC, chest x-ray and EKG there are unremarkable.   Review of Systems:  10 point review of systems completed and all systems are negative except as indicated in the history of present illness Past Medical History  Diagnosis Date  . Coronary artery disease   . Myocardial infarction   . Gout   . Hypercholesteremia   . GERD (gastroesophageal reflux disease)   . Hypertension    Past Surgical History  Procedure Laterality Date  . Colonoscopy    . Bilateral eye surgery    . Colonoscopy  05/14/2011      Procedure: COLONOSCOPY;  Surgeon: Daneil Dolin, MD;  Location: [facility];  Service: Endoscopy;  Laterality: N/A;  8:15 AM  . Cardiac surgery    . Cardiac catheterization     Social History:  reports that he has quit smoking. He has never used smokeless tobacco. He reports that he drinks about 3.0 oz of alcohol per week. He reports that he does not use illicit drugs. He is married and retired he lives at home with his wife he is independent with ADLs. He is a former Engineer, agricultural No Known Allergies  Family History  Problem Relation Age of Onset  . Colon cancer Neg Hx      Prior to Admission medications   Medication Sig Start Date End Date Taking? Authorizing Provider  aspirin (ASPIRIN EC) 81 MG EC tablet Take 81 mg by mouth every morning.    Yes Historical Provider, MD  atorvastatin (LIPITOR) 20 MG tablet Take 20 mg by mouth daily. 04/02/14  Yes Historical Provider, MD  hydrochlorothiazide (MICROZIDE) 12.5 MG capsule Take 1 capsule (12.5 mg total) by mouth daily. 02/15/14  Yes Arnoldo Lenis, MD  isosorbide mononitrate (IMDUR) 30 MG 24 hr tablet Take 1 tablet (30 mg total) by mouth daily. 07/18/13  Yes Arnoldo Lenis, MD  losartan (COZAAR) 100 MG tablet Take 100 mg by mouth daily. 02/19/14  Yes Historical Provider, MD  omeprazole (PRILOSEC) 20 MG capsule Take 20 mg by mouth every other day.    Yes Historical Provider, MD  OVER THE COUNTER MEDICATION Take 1 capsule by mouth daily. "Potassium  Supplement" - [employer] brand   Yes Historical Provider, MD  verapamil (CALAN-SR) 180 MG CR tablet Take 180 mg by mouth every morning.    Yes Historical Provider, MD   Physical Exam: Filed Vitals:   04/27/14 1100 04/27/14 1130 04/27/14 1230 04/27/14 1300  BP: 121/73 117/77 121/84 130/83  Pulse: 66 64 62 60  Temp:      TempSrc:      Resp: 27 12 18 13   Height:      Weight:      SpO2: 95% 98% 98% 97%    Wt Readings from Last 3 Encounters:  04/27/14 83.915 kg (185 lb)  02/15/14 84.369 kg  (186 lb)  08/22/13 83.915 kg (185 lb)    General:  Appears calm and comfortable Eyes: PERRL, normal lids, irises & conjunctiva ENT: grossly normal hearing, lips & tongue Neck: no LAD, masses or thyromegaly Cardiovascular: RRR, no m/r/g. No LE edema. Pedal pulses present and palpable, chest nontender to palpation  Respiratory: CTA bilaterally, no w/r/r. Normal respiratory effort. Abdomen: soft, ntnd positive bowel sounds throughout no guarding Skin: no rash or induration seen on limited exam Musculoskeletal: grossly normal tone BUE/BLE Psychiatric: grossly normal mood and affect, speech fluent and appropriate Neurologic: grossly non-focal. Speech clear facial symmetry           Labs on Admission:  Basic Metabolic Panel:  Recent Labs Lab 04/27/14 1053  NA 138  K 3.6  CL 105  CO2 25  GLUCOSE 145*  BUN 18  CREATININE 0.98  CALCIUM 9.1   Liver Function Tests: No results for input(s): AST, ALT, ALKPHOS, BILITOT, PROT, ALBUMIN in the last 168 hours. No results for input(s): LIPASE, AMYLASE in the last 168 hours. No results for input(s): AMMONIA in the last 168 hours. CBC:  Recent Labs Lab 04/27/14 1053  WBC 7.6  NEUTROABS 5.1  HGB 14.7  HCT 41.8  MCV 92.1  PLT 154   Cardiac Enzymes:  Recent Labs Lab 04/27/14 1053  TROPONINI <0.03    BNP (last 3 results) No results for input(s): PROBNP in the last 8760 hours. CBG: No results for input(s): GLUCAP in the last 168 hours.  Radiological Exams on Admission: Dg Chest Portable 1 View  04/27/2014   CLINICAL DATA:  Chest pains for 2 days  EXAM: PORTABLE CHEST - 1 VIEW  COMPARISON:  01/20/2012  FINDINGS: Cardiac shadow is at the upper limits of normal in size. The lungs are clear bilaterally. No focal infiltrate is noted. No acute bony abnormality is seen.  IMPRESSION: No active disease.   Electronically Signed   By: Inez Catalina M.D.   On: 04/27/2014 11:09    EKG: Independently reviewed sinus  rhythm  Assessment/Plan Principal Problem:   Chest pain: Atypical. History of hypertension, hyperlipidemia, CAD (status post cath in 2009 in the setting of unstable angina, stress MPI 2014 low risk study, no ischemia, echo 2014 EF of 60% with grade 2 diastolic dysfunction). Continues with chest pain intermittently for 3 out of 10. Provided with 325 mg of aspirin in the emergency department. Will admit to telemetry for rule out. We'll cycle his cardiac enzymes and obtain serial EKGs. Will provide morphine and nitroglycerin for pain as needed. Oxygen supplementation as indicated. Keep him nothing by mouth until evaluated by cardiology. Suspect he may need stress test whether that be in patient or outpatient to be determined. request cardiology consult. Active Problems:    Cardiovascular disease: Chart review indicates followed by Dr. Harl Bowie. Last stress test  done in 2014 as noted above. Home medications include aspirin, Lipitor, imdur. Will continue these.   Hypertension: Stable. Will continue his home Cozaar and verapamil will hold HCTZ.    GASTROESOPHAGEAL REFLUX DISEASE: Appears stable at baseline. Will continue PPI     Hyperglycemia: No history of diabetes. Will monitor   Code Status: full DVT Prophylaxis: Family Communication: wife at bedside Disposition Plan: home when ready hopefully tomorrow  Time spent: 60 minutes  [facility] Pager [phone]

## 2014-04-27 NOTE — ED Provider Notes (Signed)
CSN: [id number]     Arrival date & time 04/27/14  1036 History  This chart was scribed for Sharyon Cable, MD by Edison Simon, ED Scribe. This patient was seen in room APA09/APA09 and the patient's care was started at 11:16 AM.    Chief Complaint  Patient presents with  . Chest Pain   Patient is a 64 y.o. male presenting with chest pain. The history is provided by the patient. No language interpreter was used.  Chest Pain Pain location:  Substernal area Radiates to: left and right chest. Pain radiates to the back: no   Pain severity:  Mild Onset quality:  Sudden Duration:  1 day Timing:  Intermittent Progression:  Unchanged Chronicity:  New Context: at rest   Relieved by:  Nothing Worsened by:  Nothing tried Ineffective treatments:  Aspirin Associated symptoms: shortness of breath   Associated symptoms: no abdominal pain, no cough, no fever and not vomiting     HPI Comments: Dennis [name] is a 64 y.o. male with history of MI, cardiac stents, HTN, and GERD who presents to the Emergency Department complaining of intermittent chest pain with onset yesterday morning while sitting. He states symptoms began with a sudden pain to the middle of his chest and radiating outwards lasting approximately 5 seconds, and since then has had SOB and intermittent "lighter" chest pains. He states he uses ASA every day and used 81mg  today. He states he is not having any pain right now. He reports prior MI in 1998 with stents later put in, and states he has been well since then. He is unsure if this is like prior MI because he passed out at that time. He denies fever, vomiting, cough, abdominal pain, leg swelling, or syncope.  Past Medical History  Diagnosis Date  . Coronary artery disease   . Myocardial infarction   . Gout   . Hypercholesteremia   . GERD (gastroesophageal reflux disease)   . Hypertension    Past Surgical History  Procedure Laterality Date  . Colonoscopy    . Bilateral eye  surgery    . Colonoscopy  05/14/2011    Procedure: COLONOSCOPY;  Surgeon: Daneil Dolin, MD;  Location: [facility];  Service: Endoscopy;  Laterality: N/A;  8:15 AM  . Cardiac surgery    . Cardiac catheterization     Family History  Problem Relation Age of Onset  . Colon cancer Neg Hx    History  Substance Use Topics  . Smoking status: Former Smoker -- 0.25 packs/day for 3 years  . Smokeless tobacco: Never Used  . Alcohol Use: 3.0 oz/week    5 Cans of beer per week     Comment: occ    Review of Systems  Constitutional: Negative for fever.  Respiratory: Positive for shortness of breath. Negative for cough.   Cardiovascular: Positive for chest pain. Negative for leg swelling.  Gastrointestinal: Negative for vomiting and abdominal pain.  Neurological: Negative for syncope.  All other systems reviewed and are negative.     Allergies  Review of patient's allergies indicates no known allergies.  Home Medications   Prior to Admission medications   Medication Sig Start Date End Date Taking? Authorizing Provider  aspirin (ASPIRIN EC) 81 MG EC tablet Take 81 mg by mouth every morning.    Yes Historical Provider, MD  atorvastatin (LIPITOR) 20 MG tablet Take 20 mg by mouth daily. 04/02/14  Yes Historical Provider, MD  hydrochlorothiazide (MICROZIDE) 12.5 MG capsule Take 1  capsule (12.5 mg total) by mouth daily. 02/15/14  Yes Arnoldo Lenis, MD  isosorbide mononitrate (IMDUR) 30 MG 24 hr tablet Take 1 tablet (30 mg total) by mouth daily. 07/18/13  Yes Arnoldo Lenis, MD  losartan (COZAAR) 100 MG tablet Take 100 mg by mouth daily. 02/19/14  Yes Historical Provider, MD  omeprazole (PRILOSEC) 20 MG capsule Take 20 mg by mouth every other day.    Yes Historical Provider, MD  OVER THE COUNTER MEDICATION Take 1 capsule by mouth daily. "Potassium Supplement" - [employer] brand   Yes Historical Provider, MD  verapamil (CALAN-SR) 180 MG CR tablet Take 180 mg by mouth every morning.    Yes  Historical Provider, MD   BP 121/76 mmHg  Pulse 70  Temp(Src) 98.5 F (36.9 C) (Oral)  Resp 13  Ht 5\' 6"  (1.676 m)  Wt 185 lb (83.915 kg)  BMI 29.87 kg/m2  SpO2 98% Physical Exam  Nursing note and vitals reviewed.  CONSTITUTIONAL: Well developed/well nourished HEAD: Normocephalic/atraumatic EYES: EOMI ENMT: Mucous membranes moist NECK: supple no meningeal signs SPINE/BACK:entire spine nontender CV: S1/S2 noted, no murmurs/rubs/gallops noted LUNGS: Lungs are clear to auscultation bilaterally, no apparent distress ABDOMEN: soft, nontender, no rebound or guarding, bowel sounds noted throughout abdomen GU:no cva tenderness NEURO: Pt is awake/alert/appropriate, moves all extremitiesx4.  No facial droop.   EXTREMITIES: pulses normal/equal, full ROM SKIN: warm, color normal PSYCH: no abnormalities of mood noted, alert and oriented to situation  ED Course  Procedures   DIAGNOSTIC STUDIES: Oxygen Saturation is 98% on room air, normal by my interpretation.    COORDINATION OF CARE: 11:39 AM Discussed treatment plan with patient at beside, the patient agrees with the plan and has no further questions at this time. 2:28 PM Pt is CP free at this time Given history of CAD and recent chest pain, I advised admission/monitoring to r/o ACS  D/w dr Sarajane Jews will admit to hospital for monitoring  Labs Review Labs Reviewed  BASIC METABOLIC PANEL - Abnormal; Notable for the following:    Glucose, Bld 145 (*)    GFR calc non Af Amer 85 (*)    All other components within normal limits  CBC WITH DIFFERENTIAL  TROPONIN I    Imaging Review Dg Chest Portable 1 View  04/27/2014   CLINICAL DATA:  Chest pains for 2 days  EXAM: PORTABLE CHEST - 1 VIEW  COMPARISON:  01/20/2012  FINDINGS: Cardiac shadow is at the upper limits of normal in size. The lungs are clear bilaterally. No focal infiltrate is noted. No acute bony abnormality is seen.  IMPRESSION: No active disease.   Electronically Signed    By: Inez Catalina M.D.   On: 04/27/2014 11:09     EKG Interpretation   Date/Time:  Thursday April 27 2014 10:53:06 EST Ventricular Rate:  69 PR Interval:  197 QRS Duration: 81 QT Interval:  405 QTC Calculation: 434 R Axis:   31 Text Interpretation:  Sinus rhythm Low voltage, precordial leads  Non-specific ST-t changes Abnormal ekg No significant change since last  tracing Confirmed by Christy Gentles  MD, Elenore Rota ([id number]) on 04/27/2014 11:11:04 AM       Medications  aspirin chewable tablet 324 mg (324 mg Oral Given 04/27/14 1142)    MDM   Final diagnoses:  Chest pain, rule out acute myocardial infarction    Nursing notes including past medical history and social history reviewed and considered in documentation xrays/imaging reviewed by myself and considered during evaluation Labs/vital reviewed  myself and considered during evaluation Previous records reviewed and considered    I personally performed the services described in this documentation, which was scribed in my presence. The recorded information has been reviewed and is accurate.      Sharyon Cable, MD 04/27/14 [phone]

## 2014-04-28 LAB — BASIC METABOLIC PANEL
Anion gap: 6 (ref 5–15)
BUN: 19 mg/dL (ref 6–23)
CO2: 29 mmol/L (ref 19–32)
Calcium: 8.9 mg/dL (ref 8.4–10.5)
Chloride: 107 mEq/L (ref 96–112)
Creatinine, Ser: 0.99 mg/dL (ref 0.50–1.35)
GFR calc Af Amer: >90 mL/min (ref >90)
GFR calc non Af Amer: 85 mL/min — ABNORMAL LOW (ref >90)
GLUCOSE: 97 mg/dL (ref 70–99)
Potassium: 3.7 mmol/L (ref 3.5–5.1)
Sodium: 142 mmol/L (ref 135–145)

## 2014-04-28 LAB — TROPONIN I: Troponin I: <0.03 ng/mL (ref <0.031)

## 2014-04-28 NOTE — Care Management Utilization Note (Signed)
UR completed 

## 2014-04-28 NOTE — Progress Notes (Signed)
Dennis [name] discharged home with wife per MD order.  Discharge instructions reviewed and discussed with the patient, all questions and concerns answered. Copy of instructions and scripts given to patient.    Medication List    TAKE these medications        aspirin EC 81 MG EC tablet  Generic drug:  aspirin  Take 81 mg by mouth every morning.     atorvastatin 20 MG tablet  Commonly known as:  LIPITOR  Take 20 mg by mouth daily.     hydrochlorothiazide 12.5 MG capsule  Commonly known as:  MICROZIDE  Take 1 capsule (12.5 mg total) by mouth daily.     isosorbide mononitrate 30 MG 24 hr tablet  Commonly known as:  IMDUR  Take 1 tablet (30 mg total) by mouth daily.     losartan 100 MG tablet  Commonly known as:  COZAAR  Take 100 mg by mouth daily.     omeprazole 20 MG capsule  Commonly known as:  PRILOSEC  Take 20 mg by mouth every other day.     OVER THE COUNTER MEDICATION  Take 1 capsule by mouth daily. "Potassium Supplement" - [employer] brand     verapamil 180 MG CR tablet  Commonly known as:  CALAN-SR  Take 180 mg by mouth every morning.        Patients skin is clean, dry and intact, no evidence of skin break down. IV site discontinued and catheter remains intact. Site without signs and symptoms of complications. Dressing and pressure applied.  Patient escorted to car by Thelma Lorenzetti,RN in a wheelchair,  no distress noted upon discharge.  Regino Bellow 04/28/2014 8:52 AM

## 2014-04-28 NOTE — Discharge Summary (Signed)
Physician Discharge Summary  GAHEL SAFLEY OXB:[id number] DOB: Sep 17, 1950 DOA: 04/27/2014   Admit date: 04/27/2014 Discharge date: 04/28/2014  Discharge Diagnoses:  Principal Problem:   Chest pain, rule out acute myocardial infarction Active Problems:   Cardiovascular disease   GASTROESOPHAGEAL REFLUX DISEASE   Hypertension   Hyperglycemia   Chest pain at rest    Wt Readings from Last 3 Encounters:  04/27/14 185 lb (83.915 kg)  02/15/14 186 lb (84.369 kg)  08/22/13 185 lb (83.915 kg)     Hospital Course:  This patient is a 64 year old male who presented to the emergency room after experiencing chest pain. He described a 5 second midsternal pain radiating bilaterally on the day prior to admission. He had some recurring episodes of chest discomfort but only lasting a few seconds. His EKG revealed normal sinus rhythm with nonspecific ST-T wave changes. He was hospitalized to rule out an MI. He has a past history of coronary artery disease. He had a low risk stress test in 2014. Serial troponins were negative.  Telemetry was unremarkable. He had no symptoms consistent with angina. He is treated for esophageal reflux with omeprazole. His chest x-ray revealed no infiltrate. His initial glucose was elevated but his repeat was normal. Hypertension is controlled on verapamil, losartan and hydrochlorothiazide. Hyperlipidemia is treated with atorvastatin. He does not smoke. He is on daily aspirin and isosorbide.  His condition is improved at discharge on the morning of the 22nd. Lungs are clear. Heart regular with no murmurs. No chest wall tenderness on palpation. Abdomen is soft and nontender.  He will continue his usual home medications. Medications were reviewed with him in detail. He'll be seen in follow-up as an outpatient in one week.   Discharge Instructions     Medication List    TAKE these medications        aspirin EC 81 MG EC tablet  Generic drug:  aspirin  Take 81 mg by  mouth every morning.     atorvastatin 20 MG tablet  Commonly known as:  LIPITOR  Take 20 mg by mouth daily.     hydrochlorothiazide 12.5 MG capsule  Commonly known as:  MICROZIDE  Take 1 capsule (12.5 mg total) by mouth daily.     isosorbide mononitrate 30 MG 24 hr tablet  Commonly known as:  IMDUR  Take 1 tablet (30 mg total) by mouth daily.     losartan 100 MG tablet  Commonly known as:  COZAAR  Take 100 mg by mouth daily.     omeprazole 20 MG capsule  Commonly known as:  PRILOSEC  Take 20 mg by mouth every other day.     OVER THE COUNTER MEDICATION  Take 1 capsule by mouth daily. "Potassium Supplement" - [employer] brand     verapamil 180 MG CR tablet  Commonly known as:  CALAN-SR  Take 180 mg by mouth every morning.         Jrake Rodriquez 04/28/2014

## 2014-07-12 ENCOUNTER — Other Ambulatory Visit: Payer: Self-pay | Admitting: Cardiology

## 2014-07-12 MED ORDER — ISOSORBIDE MONONITRATE ER 30 MG PO TB24: 30.0000 mg | ORAL_TABLET | Freq: Every day | ORAL | Status: DC

## 2014-07-12 NOTE — Telephone Encounter (Signed)
Needs refill on IMDUR sent to [facility] / tg

## 2014-07-12 NOTE — Telephone Encounter (Signed)
Refill complete 

## 2014-08-25 ENCOUNTER — Ambulatory Visit (INDEPENDENT_AMBULATORY_CARE_PROVIDER_SITE_OTHER): Payer: BLUE CROSS/BLUE SHIELD | Admitting: Cardiology

## 2014-08-25 ENCOUNTER — Encounter: Payer: Self-pay | Admitting: Cardiology

## 2014-08-25 VITALS — BP 108/68 | HR 73 | Ht 66.0 in | Wt 191.0 lb

## 2014-08-25 DIAGNOSIS — E785 Hyperlipidemia, unspecified: Secondary | ICD-10-CM

## 2014-08-25 DIAGNOSIS — I10 Essential (primary) hypertension: Secondary | ICD-10-CM

## 2014-08-25 DIAGNOSIS — I2510 Atherosclerotic heart disease of native coronary artery without angina pectoris: Secondary | ICD-10-CM | POA: Diagnosis not present

## 2014-08-25 NOTE — Progress Notes (Signed)
Clinical Summary Dennis [name] is a 64 y.o.male seen today for follow up of the following medical problems.   1. CAD  - cath 06/2007 in the setting of unstable angina, PCI with DES to LAD,PTCA to diagonal, and DES to RCA LV gram LVEF 60%  - 01/2013 Stress MPI low risk study, no ischemia - 01/2013 echo LVEF 60-65%, grade II diastolic dysfunction   - since last visit admit Jan 2016 with chest pain thought to be GERD, ruled out for ACS at that time.  he denies any significant chest pain, no SOB or DOE since that time.  - compliant with meds.    2. HTN - checks bp occasionally, typically 110-120/70-80s  3. Hyperlipidemia - last lipid panel 05/2014 TC 125 TG 158 HDL 36 LDL 57 - compliant with statin, prior mylagias on lipitor 20 mg daily, tolerates lipitor 10mg  daily.    Past Medical History  Diagnosis Date  . Coronary artery disease   . Myocardial infarction   . Gout   . Hypercholesteremia   . GERD (gastroesophageal reflux disease)   . Hypertension      No Known Allergies   Current Outpatient Prescriptions  Medication Sig Dispense Refill  . aspirin (ASPIRIN EC) 81 MG EC tablet Take 81 mg by mouth every morning.     Marland Kitchen atorvastatin (LIPITOR) 20 MG tablet Take 20 mg by mouth daily.  1  . hydrochlorothiazide (MICROZIDE) 12.5 MG capsule Take 1 capsule (12.5 mg total) by mouth daily. 90 capsule 3  . isosorbide mononitrate (IMDUR) 30 MG 24 hr tablet Take 1 tablet (30 mg total) by mouth daily. 90 tablet 3  . losartan (COZAAR) 100 MG tablet Take 100 mg by mouth daily.  1  . omeprazole (PRILOSEC) 20 MG capsule Take 20 mg by mouth every other day.     Marland Kitchen OVER THE COUNTER MEDICATION Take 1 capsule by mouth daily. "Potassium Supplement" - [employer] brand    . verapamil (CALAN-SR) 180 MG CR tablet Take 180 mg by mouth every morning.      No current facility-administered medications for this visit.     Past Surgical History  Procedure Laterality Date  . Colonoscopy    .  Bilateral eye surgery    . Colonoscopy  05/14/2011    Procedure: COLONOSCOPY;  Surgeon: Daneil Dolin, MD;  [facility];  Service: Endoscopy;  Laterality: N/A;  8:15 AM  . Cardiac surgery    . Cardiac catheterization       No Known Allergies    Family History  Problem Relation Age of Onset  . Colon cancer Neg Hx      Social History Dennis [name] reports that he has quit smoking. He has never used smokeless tobacco. Dennis [name] reports that he drinks about 3.0 oz of alcohol per week.   Review of Systems CONSTITUTIONAL: No weight loss, fever, chills, weakness or fatigue.  HEENT: Eyes: No visual loss, blurred vision, double vision or yellow sclerae.No hearing loss, sneezing, congestion, runny nose or sore throat.  SKIN: No rash or itching.  CARDIOVASCULAR: per hpi RESPIRATORY: No shortness of breath, cough or sputum.  GASTROINTESTINAL: No anorexia, nausea, vomiting or diarrhea. No abdominal pain or blood.  GENITOURINARY: No burning on urination, no polyuria NEUROLOGICAL: No headache, dizziness, syncope, paralysis, ataxia, numbness or tingling in the extremities. No change in bowel or bladder control.  MUSCULOSKELETAL: No muscle, back pain, joint pain or stiffness.  LYMPHATICS: No enlarged nodes. No history of  splenectomy.  PSYCHIATRIC: No history of depression or anxiety.  ENDOCRINOLOGIC: No reports of sweating, cold or heat intolerance. No polyuria or polydipsia.  Marland Kitchen   Physical Examination Filed Vitals:   08/25/14 1314  BP: 108/68  Pulse: 73   Filed Vitals:   08/25/14 1314  Height: 5\' 6"  (1.676 m)  Weight: 191 lb (86.637 kg)    Gen: resting comfortably, no acute distress HEENT: no scleral icterus, pupils equal round and reactive, no palptable cervical adenopathy,  CV: RRR, no m/r/g, no JVD, no carotid bruits Resp: Clear to auscultation bilaterally GI: abdomen is soft, non-tender, non-distended, normal bowel sounds, no hepatosplenomegaly MSK: extremities  are warm, no edema.  Skin: warm, no rash Neuro:  no focal deficits Psych: appropriate affect   Diagnostic Studies 01/2013 Stress MPI Analysis of the raw perfusion data finds radiotracer uptake in the gut adjacent to the inferior wall.  Tomographic views were obtained using the short axis, vertical long axis, and horizontal long axis planes. There is a small, mild to moderate intensity, inferior perfusion defect that is fixed. No significant reversible defects to indicate ischemia.  Gated imaging reveals an EDV of 70, ESV of 24, normal TID ratio of 0.89, and LVEF of 65% without wall motion abnormality.  IMPRESSION: Low risk exercise Cardiolite. No chest pain reported, there were no diagnostic ST segment changes at maximum work load of 10.1 METS. Perfusion imaging is most consistent with inferior wall attenuation, no definite scar or ischemic defects. LV volumes are normal with LVEF 65% and no focal wall motion abnormalities.  01/2013 Echo Study Conclusions  - Left ventricle: The cavity size was normal. There was mild concentric hypertrophy. Systolic function was normal. The estimated ejection fraction was in the range of 60% to 65%. Wall motion was normal; there were no regional wall motion abnormalities. Features are consistent with a pseudonormal left ventricular filling pattern, with concomitant abnormal relaxation and increased filling pressure (grade 2 diastolic dysfunction). - Mitral valve: Trivial regurgitation. - Left atrium: The atrium was at the upper limits of normal in size. - Tricuspid valve: Physiologic regurgitation. - Pulmonary arteries: Systolic pressure could not be accurately estimated. - Inferior vena cava: Poorly visualized. Unable to estimate CVP. - Pericardium, extracardiac: There was no pericardial effusion. Impressions:  - Comparison to prior study March 2009. Mild LVH with LVEF 30-09%, grade 2 diastolic dysfunction.  Upper normal left atrial size. Trivial mitral and tricuspid regurgitation. Unable to assess PASP or CVP.     Assessment and Plan   1. CAD - no current symptoms - continue secondary prevention and risk factor modification.   2. HTN - at goal based on home numbers, continue current meds  3. Hyperlipidemia - at goal, continue current statin. Prior myalgias on higher dose statin   F/u 6 months   Arnoldo Lenis, M.D.

## 2014-08-25 NOTE — Patient Instructions (Signed)
Your physician wants you to follow-up in: 6 months with Dr.Branch You will receive a reminder letter in the mail two months in advance. If you don't receive a letter, please call our office to schedule the follow-up appointment.   Your physician recommends that you continue on your current medications as directed. Please refer to the Current Medication list given to you today.    Thank you for choosing [facility] !        

## 2015-01-09 ENCOUNTER — Other Ambulatory Visit (HOSPITAL_COMMUNITY): Payer: Self-pay | Admitting: Internal Medicine

## 2015-01-09 DIAGNOSIS — R1011 Right upper quadrant pain: Secondary | ICD-10-CM

## 2015-01-12 ENCOUNTER — Ambulatory Visit (HOSPITAL_COMMUNITY)
Admission: RE | Admit: 2015-01-12 | Discharge: 2015-01-12 | Disposition: A | Discharge status: Home / Self Care | Payer: BLUE CROSS/BLUE SHIELD | Source: Ambulatory Visit | Attending: Internal Medicine | Admitting: Internal Medicine

## 2015-01-12 DIAGNOSIS — R1011 Right upper quadrant pain: Secondary | ICD-10-CM | POA: Diagnosis present

## 2015-01-12 DIAGNOSIS — G8929 Other chronic pain: Secondary | ICD-10-CM | POA: Diagnosis not present

## 2015-01-22 ENCOUNTER — Telehealth: Payer: Self-pay | Admitting: Cardiology

## 2015-01-22 MED ORDER — HYDROCHLOROTHIAZIDE 12.5 MG PO CAPS: 12.5000 mg | ORAL_CAPSULE | Freq: Every day | ORAL | Status: DC

## 2015-01-22 NOTE — Telephone Encounter (Signed)
Refill complete 

## 2015-01-22 NOTE — Telephone Encounter (Signed)
Pt is scheduled to see Dr. Harl Bowie on 11/30 but he's about to run out of his Hydrochlorothiazide 12.5 mg will need some to last him till that apt.

## 2015-03-07 ENCOUNTER — Ambulatory Visit (INDEPENDENT_AMBULATORY_CARE_PROVIDER_SITE_OTHER): Payer: Medicare Other | Admitting: Cardiology

## 2015-03-07 VITALS — BP 112/72 | HR 66 | Ht 66.0 in | Wt 190.0 lb

## 2015-03-07 DIAGNOSIS — I10 Essential (primary) hypertension: Secondary | ICD-10-CM | POA: Diagnosis not present

## 2015-03-07 DIAGNOSIS — E785 Hyperlipidemia, unspecified: Secondary | ICD-10-CM

## 2015-03-07 DIAGNOSIS — I2510 Atherosclerotic heart disease of native coronary artery without angina pectoris: Secondary | ICD-10-CM

## 2015-03-07 NOTE — Patient Instructions (Signed)
Your physician wants you to follow-up in: 1 Year with Dr. Branch. You will receive a reminder letter in the mail two months in advance. If you don't receive a letter, please call our office to schedule the follow-up appointment.   Your physician recommends that you continue on your current medications as directed. Please refer to the Current Medication list given to you today.  If you need a refill on your cardiac medications before your next appointment, please call your pharmacy.  Thank you for choosing [facility]!   

## 2015-03-07 NOTE — Progress Notes (Signed)
Patient ID: MATHEUS SPORN, male   DOB: November 19, 1950, 64 y.o.   MRN: [id number]     Clinical Summary Mr. Mandl is a 64 y.o.male seen today for follow up of the following medical problems.    1. CAD  - cath 06/2007 in the setting of unstable angina, PCI with DES to LAD,PTCA to diagonal, and DES to RCA LV gram LVEF 60%  - 01/2013 Stress MPI low risk study, no ischemia - 01/2013 echo LVEF 60-65%, grade II diastolic dysfunction  - denies any chest pain since last visit. Had some abdominal pain that has improved with increased PPI - no SOB or DOE - compliant with meds   2. HTN - compliant with meds  3. Hyperlipidemia - last lipid panel 05/2014 TC 125 TG 158 HDL 36 LDL 57 - compliant with statin, prior mylagias on lipitor 20 mg daily, tolerates lipitor 10mg  daily.      SH: planning trip to trip to [place]. Enjoys classic cars Past Medical History  Diagnosis Date  . Coronary artery disease   . Myocardial infarction ([place])   . Gout   . Hypercholesteremia   . GERD (gastroesophageal reflux disease)   . Hypertension      No Known Allergies   Current Outpatient Prescriptions  Medication Sig Dispense Refill  . aspirin (ASPIRIN EC) 81 MG EC tablet Take 81 mg by mouth every morning.     Marland Kitchen atorvastatin (LIPITOR) 20 MG tablet Take 10 mg by mouth daily.   1  . hydrochlorothiazide (MICROZIDE) 12.5 MG capsule Take 1 capsule (12.5 mg total) by mouth daily. 90 capsule 3  . isosorbide mononitrate (IMDUR) 30 MG 24 hr tablet Take 1 tablet (30 mg total) by mouth daily. 90 tablet 3  . losartan (COZAAR) 100 MG tablet Take 100 mg by mouth daily.  1  . omeprazole (PRILOSEC) 20 MG capsule Take 20 mg by mouth every other day.     Marland Kitchen OVER THE COUNTER MEDICATION Take 1 capsule by mouth daily. "Potassium Supplement" - [employer] brand    . verapamil (CALAN-SR) 180 MG CR tablet Take 180 mg by mouth every morning.      No current facility-administered medications for this visit.      Past Surgical History  Procedure Laterality Date  . Colonoscopy    . Bilateral eye surgery    . Colonoscopy  05/14/2011    Procedure: COLONOSCOPY;  Surgeon: Daneil Dolin, MD;  [facility];  Service: Endoscopy;  Laterality: N/A;  8:15 AM  . Cardiac surgery    . Cardiac catheterization       No Known Allergies    Family History  Problem Relation Age of Onset  . Colon cancer Neg Hx      Social History Mr. Sheldon reports that he has quit smoking. He has never used smokeless tobacco. Mr. Nethercott reports that he drinks about 3.0 oz of alcohol per week.   Review of Systems CONSTITUTIONAL: No weight loss, fever, chills, weakness or fatigue.  HEENT: Eyes: No visual loss, blurred vision, double vision or yellow sclerae.No hearing loss, sneezing, congestion, runny nose or sore throat.  SKIN: No rash or itching.  CARDIOVASCULAR: per HPI RESPIRATORY: No shortness of breath, cough or sputum.  GASTROINTESTINAL: No anorexia, nausea, vomiting or diarrhea. No abdominal pain or blood.  GENITOURINARY: No burning on urination, no polyuria NEUROLOGICAL: No headache, dizziness, syncope, paralysis, ataxia, numbness or tingling in the extremities. No change in bowel or bladder control.  MUSCULOSKELETAL: No muscle, back pain, joint pain or stiffness.  LYMPHATICS: No enlarged nodes. No history of splenectomy.  PSYCHIATRIC: No history of depression or anxiety.  ENDOCRINOLOGIC: No reports of sweating, cold or heat intolerance. No polyuria or polydipsia.  Marland Kitchen   Physical Examination Filed Vitals:   03/07/15 1028  BP: 112/72  Pulse: 66   Filed Vitals:   03/07/15 1028  Height: 5\' 6"  (1.676 m)  Weight: 190 lb (86.183 kg)    Gen: resting comfortably, no acute distress HEENT: no scleral icterus, pupils equal round and reactive, no palptable cervical adenopathy,  CV: RRR, no m/r/g, no jvd Resp: Clear to auscultation bilaterally GI: abdomen is soft, non-tender, non-distended,  normal bowel sounds, no hepatosplenomegaly MSK: extremities are warm, no edema.  Skin: warm, no rash Neuro:  no focal deficits Psych: appropriate affect   Diagnostic Studies 01/2013 Stress MPI Analysis of the raw perfusion data finds radiotracer uptake in the gut adjacent to the inferior wall.  Tomographic views were obtained using the short axis, vertical long axis, and horizontal long axis planes. There is a small, mild to moderate intensity, inferior perfusion defect that is fixed. No significant reversible defects to indicate ischemia.  Gated imaging reveals an EDV of 70, ESV of 24, normal TID ratio of 0.89, and LVEF of 65% without wall motion abnormality.  IMPRESSION: Low risk exercise Cardiolite. No chest pain reported, there were no diagnostic ST segment changes at maximum work load of 10.1 METS. Perfusion imaging is most consistent with inferior wall attenuation, no definite scar or ischemic defects. LV volumes are normal with LVEF 65% and no focal wall motion abnormalities.  01/2013 Echo Study Conclusions  - Left ventricle: The cavity size was normal. There was mild concentric hypertrophy. Systolic function was normal. The estimated ejection fraction was in the range of 60% to 65%. Wall motion was normal; there were no regional wall motion abnormalities. Features are consistent with a pseudonormal left ventricular filling pattern, with concomitant abnormal relaxation and increased filling pressure (grade 2 diastolic dysfunction). - Mitral valve: Trivial regurgitation. - Left atrium: The atrium was at the upper limits of normal in size. - Tricuspid valve: Physiologic regurgitation. - Pulmonary arteries: Systolic pressure could not be accurately estimated. - Inferior vena cava: Poorly visualized. Unable to estimate CVP. - Pericardium, extracardiac: There was no pericardial effusion. Impressions:  - Comparison to prior study March 2009.  Mild LVH with LVEF 123456, grade 2 diastolic dysfunction. Upper normal left atrial size. Trivial mitral and tricuspid regurgitation. Unable to assess PASP or CVP.       Assessment and Plan   1. CAD - no current symptoms - continue secondary prevention and risk factor modification.   2. HTN - at goal, continue current meds  3. Hyperlipidemia - at goal, continue current statin.  Prior myalgias on higher dose statin   F/u 1 year   Arnoldo Lenis, M.D.

## 2015-03-08 ENCOUNTER — Encounter: Payer: Self-pay | Admitting: Cardiology

## 2015-05-24 DIAGNOSIS — Z79899 Other long term (current) drug therapy: Secondary | ICD-10-CM | POA: Diagnosis not present

## 2015-05-24 DIAGNOSIS — K219 Gastro-esophageal reflux disease without esophagitis: Secondary | ICD-10-CM | POA: Diagnosis not present

## 2015-05-24 DIAGNOSIS — Z125 Encounter for screening for malignant neoplasm of prostate: Secondary | ICD-10-CM | POA: Diagnosis not present

## 2015-05-24 DIAGNOSIS — I2510 Atherosclerotic heart disease of native coronary artery without angina pectoris: Secondary | ICD-10-CM | POA: Diagnosis not present

## 2015-05-24 DIAGNOSIS — E785 Hyperlipidemia, unspecified: Secondary | ICD-10-CM | POA: Diagnosis not present

## 2015-05-28 DIAGNOSIS — Z23 Encounter for immunization: Secondary | ICD-10-CM | POA: Diagnosis not present

## 2015-05-28 DIAGNOSIS — Z0001 Encounter for general adult medical examination with abnormal findings: Secondary | ICD-10-CM | POA: Diagnosis not present

## 2015-05-28 DIAGNOSIS — Z6831 Body mass index (BMI) 31.0-31.9, adult: Secondary | ICD-10-CM | POA: Diagnosis not present

## 2015-05-28 DIAGNOSIS — E785 Hyperlipidemia, unspecified: Secondary | ICD-10-CM | POA: Diagnosis not present

## 2015-05-28 DIAGNOSIS — I10 Essential (primary) hypertension: Secondary | ICD-10-CM | POA: Diagnosis not present

## 2015-07-05 DIAGNOSIS — S92515A Nondisplaced fracture of proximal phalanx of left lesser toe(s), initial encounter for closed fracture: Secondary | ICD-10-CM | POA: Diagnosis not present

## 2015-07-05 DIAGNOSIS — M25572 Pain in left ankle and joints of left foot: Secondary | ICD-10-CM | POA: Diagnosis not present

## 2015-07-15 ENCOUNTER — Other Ambulatory Visit: Payer: Self-pay | Admitting: Cardiology

## 2015-08-02 DIAGNOSIS — S92515D Nondisplaced fracture of proximal phalanx of left lesser toe(s), subsequent encounter for fracture with routine healing: Secondary | ICD-10-CM | POA: Diagnosis not present

## 2015-08-19 ENCOUNTER — Observation Stay (HOSPITAL_COMMUNITY)
Admission: EM | Admit: 2015-08-19 | Discharge: 2015-08-20 | Disposition: A | Discharge status: Home / Self Care | Payer: Medicare HMO | Attending: Internal Medicine | Admitting: Internal Medicine

## 2015-08-19 ENCOUNTER — Emergency Department (HOSPITAL_COMMUNITY): Payer: Medicare HMO

## 2015-08-19 ENCOUNTER — Encounter (HOSPITAL_COMMUNITY): Payer: Self-pay | Admitting: Emergency Medicine

## 2015-08-19 DIAGNOSIS — I2510 Atherosclerotic heart disease of native coronary artery without angina pectoris: Secondary | ICD-10-CM | POA: Diagnosis not present

## 2015-08-19 DIAGNOSIS — R072 Precordial pain: Principal | ICD-10-CM | POA: Insufficient documentation

## 2015-08-19 DIAGNOSIS — Z79899 Other long term (current) drug therapy: Secondary | ICD-10-CM | POA: Insufficient documentation

## 2015-08-19 DIAGNOSIS — I209 Angina pectoris, unspecified: Secondary | ICD-10-CM | POA: Diagnosis present

## 2015-08-19 DIAGNOSIS — I10 Essential (primary) hypertension: Secondary | ICD-10-CM | POA: Diagnosis present

## 2015-08-19 DIAGNOSIS — R739 Hyperglycemia, unspecified: Secondary | ICD-10-CM | POA: Diagnosis not present

## 2015-08-19 DIAGNOSIS — R079 Chest pain, unspecified: Secondary | ICD-10-CM

## 2015-08-19 DIAGNOSIS — Z7982 Long term (current) use of aspirin: Secondary | ICD-10-CM | POA: Diagnosis not present

## 2015-08-19 DIAGNOSIS — R0602 Shortness of breath: Secondary | ICD-10-CM | POA: Diagnosis not present

## 2015-08-19 DIAGNOSIS — R110 Nausea: Secondary | ICD-10-CM | POA: Diagnosis not present

## 2015-08-19 DIAGNOSIS — I252 Old myocardial infarction: Secondary | ICD-10-CM | POA: Insufficient documentation

## 2015-08-19 LAB — BASIC METABOLIC PANEL
ANION GAP: 7 (ref 5–15)
BUN: 16 mg/dL (ref 6–20)
CO2: 26 mmol/L (ref 22–32)
Calcium: 9.5 mg/dL (ref 8.9–10.3)
Chloride: 104 mmol/L (ref 101–111)
Creatinine, Ser: 0.89 mg/dL (ref 0.61–1.24)
GFR calc non Af Amer: >60 mL/min (ref >60)
Glucose, Bld: 118 mg/dL — ABNORMAL HIGH (ref 65–99)
POTASSIUM: 3.9 mmol/L (ref 3.5–5.1)
Sodium: 137 mmol/L (ref 135–145)

## 2015-08-19 LAB — PROTIME-INR
INR: 1.1 (ref 0.00–1.49)
PROTHROMBIN TIME: 14.4 s (ref 11.6–15.2)

## 2015-08-19 LAB — TROPONIN I

## 2015-08-19 LAB — I-STAT TROPONIN, ED: Troponin i, poc: 0 ng/mL (ref 0.00–0.08)

## 2015-08-19 LAB — CBC
HEMATOCRIT: 43.1 % (ref 39.0–52.0)
Hemoglobin: 14.9 g/dL (ref 13.0–17.0)
MCH: 32.1 pg (ref 26.0–34.0)
MCHC: 34.6 g/dL (ref 30.0–36.0)
MCV: 92.9 fL (ref 78.0–100.0)
Platelets: 180 10*3/uL (ref 150–400)
RBC: 4.64 MIL/uL (ref 4.22–5.81)
RDW: 13.2 % (ref 11.5–15.5)
WBC: 11 10*3/uL — AB (ref 4.0–10.5)

## 2015-08-19 LAB — APTT: APTT: 29 s (ref 24–37)

## 2015-08-19 MED ORDER — ATORVASTATIN CALCIUM 10 MG PO TABS
10.0000 mg | ORAL_TABLET | Freq: Every evening | ORAL | Status: DC
  Administered 2015-08-19: 10 mg via ORAL
  Filled 2015-08-19: qty 1

## 2015-08-19 MED ORDER — FLUTICASONE PROPIONATE 50 MCG/ACT NA SUSP: 1.0000 | Freq: Every day | NASAL | Status: DC | PRN

## 2015-08-19 MED ORDER — ASPIRIN EC 81 MG PO TBEC
81.0000 mg | DELAYED_RELEASE_TABLET | Freq: Every morning | ORAL | Status: DC
  Administered 2015-08-20: 81 mg via ORAL
  Filled 2015-08-19: qty 1

## 2015-08-19 MED ORDER — SODIUM CHLORIDE 0.9 % IV SOLN: 250.0000 mL | INTRAVENOUS | Status: DC | PRN

## 2015-08-19 MED ORDER — SODIUM CHLORIDE 0.9% FLUSH
3.0000 mL | Freq: Two times a day (BID) | INTRAVENOUS | Status: DC
  Administered 2015-08-19: 3 mL via INTRAVENOUS

## 2015-08-19 MED ORDER — HYDROCHLOROTHIAZIDE 12.5 MG PO CAPS
12.5000 mg | ORAL_CAPSULE | Freq: Every day | ORAL | Status: DC
  Administered 2015-08-20: 12.5 mg via ORAL
  Filled 2015-08-19: qty 1

## 2015-08-19 MED ORDER — ENOXAPARIN SODIUM 40 MG/0.4ML ~~LOC~~ SOLN
40.0000 mg | SUBCUTANEOUS | Status: DC
  Administered 2015-08-19: 40 mg via SUBCUTANEOUS
  Filled 2015-08-19: qty 0.4

## 2015-08-19 MED ORDER — PANTOPRAZOLE SODIUM 40 MG PO TBEC
40.0000 mg | DELAYED_RELEASE_TABLET | Freq: Every day | ORAL | Status: DC
  Administered 2015-08-20: 40 mg via ORAL
  Filled 2015-08-19: qty 1

## 2015-08-19 MED ORDER — POTASSIUM GLUCONATE 595 MG PO CAPS: 1.0000 | ORAL_CAPSULE | Freq: Every day | ORAL | Status: DC

## 2015-08-19 MED ORDER — NITROGLYCERIN 2 % TD OINT
1.0000 [in_us] | TOPICAL_OINTMENT | Freq: Three times a day (TID) | TRANSDERMAL | Status: DC
  Administered 2015-08-19: 1 [in_us] via TOPICAL
  Filled 2015-08-19 (×3): qty 1

## 2015-08-19 MED ORDER — SODIUM CHLORIDE 0.9% FLUSH: 3.0000 mL | INTRAVENOUS | Status: DC | PRN

## 2015-08-19 MED ORDER — SODIUM CHLORIDE 0.9% FLUSH
3.0000 mL | Freq: Two times a day (BID) | INTRAVENOUS | Status: DC
  Administered 2015-08-20: 3 mL via INTRAVENOUS

## 2015-08-19 MED ORDER — ACETAMINOPHEN 325 MG PO TABS: 650.0000 mg | ORAL_TABLET | Freq: Four times a day (QID) | ORAL | Status: DC | PRN

## 2015-08-19 MED ORDER — LOSARTAN POTASSIUM 50 MG PO TABS
100.0000 mg | ORAL_TABLET | Freq: Every day | ORAL | Status: DC
  Administered 2015-08-20: 100 mg via ORAL
  Filled 2015-08-19: qty 2

## 2015-08-19 MED ORDER — ACETAMINOPHEN 650 MG RE SUPP: 650.0000 mg | Freq: Four times a day (QID) | RECTAL | Status: DC | PRN

## 2015-08-19 MED ORDER — VERAPAMIL HCL ER 180 MG PO TBCR
180.0000 mg | EXTENDED_RELEASE_TABLET | Freq: Every morning | ORAL | Status: DC
  Administered 2015-08-20: 180 mg via ORAL
  Filled 2015-08-19: qty 1

## 2015-08-19 NOTE — Progress Notes (Signed)
Pt. Arrived to unit rm 337 in stable condition.  Report received from [facility] ED, RN.

## 2015-08-19 NOTE — H&P (Signed)
TRH H&P   Patient Demographics:    Dennis [name], is a 65 y.o. male  MRN: [id number]   DOB - 07-07-1950  Admit Date - 08/19/2015  Outpatient Primary MD for the patient is [name], MD  Referring MD/NP/PA: Isla Pence  Outpatient Specialists: Carlyle Dolly  Patient coming from: home  Chief Complaint  Patient presents with  . Chest Pain      HPI:    Dennis [name]  is a 65 y.o. male, CAD s/p stent who apparently c/o dyspnea on exertion and some nausea this am.  Pt then had intermittent chest pain which he describes as burning/pressure in the middle of his chest without radiation.  Pt states that nothing made the pain better or worse.  Pt didn't take slg nitro.  Pt is currently cp free.  Denies fever, chills, cough, palp, emsis. Pt presented to ED and EKG => nsr at 75, nl axis, nl int, no st-t changes c/w ischemia.  Trop negative.  CXR negative.  Pt will be admitted for cp r/o.    Review of systems:    In addition to the HPI above, No Fever-chills, No Headache, No changes with Vision or hearing, No problems swallowing food or Liquids, No  Cough No Abdominal pain, No Nausea or Vommitting, Bowel movements are regular, No Blood in stool or Urine, No dysuria, No new skin rashes or bruises, No new joints pains-aches,  No new weakness, tingling, numbness in any extremity, No recent weight gain or loss, No polyuria, polydypsia or polyphagia, No significant Mental Stressors.  A full 10 point Review of Systems was done, except as stated above, all other Review of Systems were negative.   With Past History of the following :    Past Medical History  Diagnosis Date  . Coronary artery disease   . Myocardial infarction ([place]) 1998  . Gout   . Hypercholesteremia   . GERD (gastroesophageal reflux disease)   . Hypertension       Past Surgical History  Procedure  Laterality Date  . Colonoscopy    . Bilateral eye surgery    . Colonoscopy  05/14/2011    Procedure: COLONOSCOPY;  Surgeon: Daneil Dolin, MD;  [facility];  Service: Endoscopy;  Laterality: N/A;  8:15 AM  . Cardiac surgery    . Cardiac catheterization  2009    PCI with DES to LAD,PTCA to diagonal, and DES to RCA LV gram LVEF 60%       Social History:     Social History  Substance Use Topics  . Smoking status: Never Smoker   . Smokeless tobacco: Never Used  . Alcohol Use: 3.0 oz/week    5 Cans of beer per week     Comment: occ     Lives - at home  Mobility - ambulates      Family History :  Family History  Problem Relation Age of Onset  . Colon cancer Neg Hx   . Stroke Mother   . Heart attack Father       Home Medications:   Prior to Admission medications   Medication Sig Start Date End Date Taking? Authorizing Provider  aspirin (ASPIRIN EC) 81 MG EC tablet Take 81 mg by mouth every morning.    Yes Historical Provider, MD  atorvastatin (LIPITOR) 20 MG tablet Take 10 mg by mouth every evening.  04/02/14  Yes Historical Provider, MD  fluticasone (FLONASE) 50 MCG/ACT nasal spray Place 1-2 sprays into both nostrils daily as needed for allergies or rhinitis.   Yes Historical Provider, MD  hydrochlorothiazide (MICROZIDE) 12.5 MG capsule Take 1 capsule (12.5 mg total) by mouth daily. 01/22/15  Yes Arnoldo Lenis, MD  isosorbide mononitrate (IMDUR) 30 MG 24 hr tablet TAKE ONE TABLET BY MOUTH ONCE DAILY Patient taking differently: TAKE ONE TABLET BY MOUTH ONCE DAILY IN THE EVENING 07/16/15  Yes Arnoldo Lenis, MD  losartan (COZAAR) 100 MG tablet Take 100 mg by mouth daily. 02/19/14  Yes Historical Provider, MD  Naproxen Sodium (ALEVE) 220 MG CAPS Take 220-440 mg by mouth daily as needed (for pain).   Yes Historical Provider, MD  omeprazole (PRILOSEC) 20 MG capsule Take 20 mg by mouth every morning.    Yes Historical Provider, MD  Potassium Gluconate 595  MG CAPS Take 1 capsule by mouth daily.   Yes Historical Provider, MD  verapamil (CALAN-SR) 180 MG CR tablet Take 180 mg by mouth every morning.    Yes Historical Provider, MD     Allergies:    No Known Allergies   Physical Exam:   Vitals  Blood pressure 125/67, pulse 71, temperature 98.2 F (36.8 C), temperature source Oral, resp. rate 18, height 5\' 6"  (1.676 m), weight 84.1 kg (185 lb 6.5 oz), SpO2 97 %.   1. General elderly white male lying in bed in NAD  2. Normal affect and insight, Not Suicidal or Homicidal, Awake Alert, Oriented X 3.  3. No F.N deficits, ALL C.Nerves Intact, Strength 5/5 all 4 extremities, Sensation intact all 4 extremities, Plantars down going.  4. Ears and Eyes appear Normal, Conjunctivae clear, PERRLA. Moist Oral Mucosa.  5. Supple Neck, No JVD, No cervical lymphadenopathy appriciated, No Carotid Bruits.  6. Symmetrical Chest wall movement, Good air movement bilaterally, CTAB.  7. RRR, No Gallops, Rubs or Murmurs, No Parasternal Heave.  8. Positive Bowel Sounds, Abdomen Soft, No tenderness, No organomegaly appriciated,No rebound -guarding or rigidity.  9.  No Cyanosis, Normal Skin Turgor, No Skin Rash or Bruise.  10. Good muscle tone,  joints appear normal , no effusions, Normal ROM.  11. No Palpable Lymph Nodes in Neck or Axillae     Data Review:    CBC  Recent Labs Lab 08/19/15 1628  WBC 11.0*  HGB 14.9  HCT 43.1  PLT 180  MCV 92.9  MCH 32.1  MCHC 34.6  RDW 13.2   ------------------------------------------------------------------------------------------------------------------  Chemistries   Recent Labs Lab 08/19/15 1628  NA 137  K 3.9  CL 104  CO2 26  GLUCOSE 118*  BUN 16  CREATININE 0.89  CALCIUM 9.5   ------------------------------------------------------------------------------------------------------------------ estimated creatinine clearance is 84.2 mL/min (by C-G formula based on Cr of  0.89). ------------------------------------------------------------------------------------------------------------------ No results for input(s): TSH, T4TOTAL, T3FREE, THYROIDAB in the last 72 hours.  Invalid input(s): FREET3  Coagulation profile No results for input(s): INR, PROTIME in the last  168 hours. ------------------------------------------------------------------------------------------------------------------- No results for input(s): DDIMER in the last 72 hours. -------------------------------------------------------------------------------------------------------------------  Cardiac Enzymes No results for input(s): CKMB, TROPONINI, MYOGLOBIN in the last 168 hours.  Invalid input(s): CK ------------------------------------------------------------------------------------------------------------------ No results found for: BNP   ---------------------------------------------------------------------------------------------------------------  Urinalysis    Component Value Date/Time   COLORURINE YELLOW 12/31/2008 [place] 12/31/2008 1727   LABSPEC 1.019 03/08/2010 1110   PHURINE 7.0 03/08/2010 1110   GLUCOSEU neg 03/08/2010 1110   GLUCOSEU 500* 12/31/2008 1727   HGBUR NEGATIVE 12/31/2008 1727   BILIRUBINUR NEGATIVE 12/31/2008 1727   KETONESUR NEGATIVE 12/31/2008 1727   PROTEINUR neg 03/08/2010 1110   UROBILINOGEN 0.2 12/31/2008 1727   NITRITE neg 03/08/2010 1110   LEUKOCYTESUR  12/31/2008 1727    NEGATIVE MICROSCOPIC NOT DONE ON URINES WITH NEGATIVE PROTEIN, BLOOD, LEUKOCYTES, NITRITE, OR GLUCOSE <1000 mg/dL.    ----------------------------------------------------------------------------------------------------------------   Imaging Results:    Dg Chest 2 View  08/19/2015  CLINICAL DATA:  Substernal chest pain EXAM: CHEST  2 VIEW COMPARISON:  04/27/2014 FINDINGS: Cardiac shadow is within normal limits. Tortuosity of the thoracic aorta is noted.  Lungs are clear bilaterally. No acute bony abnormality is seen. IMPRESSION: No acute abnormality noted. Electronically Signed   By: Inez Catalina M.D.   On: 08/19/2015 16:40      Assessment & Plan:    Active Problems:   Chest pain    1. Cp Tele Trop i q6h x3 NPO after midnite NTP Cont aspirin, atorvastatin, verapamil, (not sure why not on betablocker? Followed by Cardiology) Cardiology consult Nuclear stress test  Hyperglycemia Check hga1c  Hypertension Cont current medications.   Gerd protonix  Stop naproxen  DVT Prophylaxis Lovenox  AM Labs Ordered, also please review Full Orders  Family Communication: Admission, patients condition and plan of care including tests being ordered have been discussed with the patient who indicate understanding and agree with the plan and Code Status.  Code Status FULL CODE  Likely DC to  home  Condition GUARDED    Consults called: please call cardiology in am  Admission status: observation  Time spent in minutes : 40 min   Jani Gravel M.D on 08/19/2015 at 6:45 PM  [phone]  Between 7am to 7pm - Pager - [phone]. After 7pm go to [email or web address] - password [facility]  [facility] - Office  [phone]

## 2015-08-19 NOTE — ED Provider Notes (Signed)
CSN: [id number]     Arrival date & time 08/19/15  1605 History   First MD Initiated Contact with Patient 08/19/15 1618     Chief Complaint  Patient presents with  . Chest Pain   PT SAID HE DEVELOPED N/V AND SOB WITH CP AROUND NOON.  HE TOOK ASA THIS AM.  HE SAID NITRO DROPPED HIS BP AND CAUSED HIM TO PASS OUT, SO HE DID NOT TAKE THAT.  THE PT SAID THAT HE HAS NO CP NOW.  LAST CATH 2009 WITH STENTS.  LAST STRESS 2014 WHICH WAS NEGATIVE.  (Consider location/radiation/quality/duration/timing/severity/associated sxs/prior Treatment) Patient is a 65 y.o. male presenting with chest pain. The history is provided by the patient.  Chest Pain Pain location:  Substernal area Pain quality: pressure   Pain radiates to:  Does not radiate Pain radiates to the back: no   Pain severity:  No pain Onset quality:  Sudden Progression:  Resolved Chronicity:  New Associated symptoms: nausea and shortness of breath     Past Medical History  Diagnosis Date  . Coronary artery disease   . Myocardial infarction ([place])   . Gout   . Hypercholesteremia   . GERD (gastroesophageal reflux disease)   . Hypertension    Past Surgical History  Procedure Laterality Date  . Colonoscopy    . Bilateral eye surgery    . Colonoscopy  05/14/2011    Procedure: COLONOSCOPY;  Surgeon: Daneil Dolin, MD;  Location: [facility];  Service: Endoscopy;  Laterality: N/A;  8:15 AM  . Cardiac surgery    . Cardiac catheterization     Family History  Problem Relation Age of Onset  . Colon cancer Neg Hx    Social History  Substance Use Topics  . Smoking status: Never Smoker   . Smokeless tobacco: Never Used  . Alcohol Use: 3.0 oz/week    5 Cans of beer per week     Comment: occ    Review of Systems  Respiratory: Positive for shortness of breath.   Cardiovascular: Positive for chest pain.  Gastrointestinal: Positive for nausea.  All other systems reviewed and are negative.     Allergies  Review of patient's  allergies indicates no known allergies.  Home Medications   Prior to Admission medications   Medication Sig Start Date End Date Taking? Authorizing Provider  aspirin (ASPIRIN EC) 81 MG EC tablet Take 81 mg by mouth every morning.    Yes Historical Provider, MD  atorvastatin (LIPITOR) 20 MG tablet Take 10 mg by mouth every evening.  04/02/14  Yes Historical Provider, MD  fluticasone (FLONASE) 50 MCG/ACT nasal spray Place 1-2 sprays into both nostrils daily as needed for allergies or rhinitis.   Yes Historical Provider, MD  hydrochlorothiazide (MICROZIDE) 12.5 MG capsule Take 1 capsule (12.5 mg total) by mouth daily. 01/22/15  Yes Arnoldo Lenis, MD  isosorbide mononitrate (IMDUR) 30 MG 24 hr tablet TAKE ONE TABLET BY MOUTH ONCE DAILY Patient taking differently: TAKE ONE TABLET BY MOUTH ONCE DAILY IN THE EVENING 07/16/15  Yes Arnoldo Lenis, MD  losartan (COZAAR) 100 MG tablet Take 100 mg by mouth daily. 02/19/14  Yes Historical Provider, MD  Naproxen Sodium (ALEVE) 220 MG CAPS Take 220-440 mg by mouth daily as needed (for pain).   Yes Historical Provider, MD  omeprazole (PRILOSEC) 20 MG capsule Take 20 mg by mouth every morning.    Yes Historical Provider, MD  Potassium Gluconate 595 MG CAPS Take 1 capsule by mouth  daily.   Yes Historical Provider, MD  verapamil (CALAN-SR) 180 MG CR tablet Take 180 mg by mouth every morning.    Yes Historical Provider, MD   BP 121/81 mmHg  Pulse 72  Temp(Src) 98.3 F (36.8 C) (Oral)  Resp 15  Ht 5\' 6"  (1.676 m)  Wt 189 lb (85.73 kg)  BMI 30.52 kg/m2  SpO2 98% Physical Exam  Constitutional: He is oriented to person, place, and time. He appears well-developed and well-nourished.  HENT:  Head: Normocephalic and atraumatic.  Right Ear: External ear normal.  Left Ear: External ear normal.  Nose: Nose normal.  Mouth/Throat: Oropharynx is clear and moist.  Eyes: Conjunctivae are normal. Pupils are equal, round, and reactive to light.  Neck: Normal  range of motion. Neck supple.  Cardiovascular: Normal rate, regular rhythm, normal heart sounds and intact distal pulses.   Pulmonary/Chest: Effort normal and breath sounds normal.  Abdominal: Soft. Bowel sounds are normal.  Musculoskeletal: Normal range of motion.  Neurological: He is alert and oriented to person, place, and time.  Skin:     Nursing note and vitals reviewed.   ED Course  Procedures (including critical care time) Labs Review Labs Reviewed  BASIC METABOLIC PANEL - Abnormal; Notable for the following:    Glucose, Bld 118 (*)    All other components within normal limits  CBC - Abnormal; Notable for the following:    WBC 11.0 (*)    All other components within normal limits  I-STAT TROPOININ, ED    Imaging Review Dg Chest 2 View  08/19/2015  CLINICAL DATA:  Substernal chest pain EXAM: CHEST  2 VIEW COMPARISON:  04/27/2014 FINDINGS: Cardiac shadow is within normal limits. Tortuosity of the thoracic aorta is noted. Lungs are clear bilaterally. No acute bony abnormality is seen. IMPRESSION: No acute abnormality noted. Electronically Signed   By: Inez Catalina M.D.   On: 08/19/2015 16:40   I have personally reviewed and evaluated these images and lab results as part of my medical decision-making.   EKG Interpretation   Date/Time:  Sunday Aug 19 2015 16:09:08 EDT Ventricular Rate:  76 PR Interval:  160 QRS Duration: 76 QT Interval:  394 QTC Calculation: 443 R Axis:   29 Text Interpretation:  Normal sinus rhythm Low voltage QRS Borderline ECG  Confirmed by Jolly Carlini MD, Cartel Mauss ([id number]) on 08/19/2015 4:33:33 PM      MDM  PT D/W DR. Maudie Mercury ([facility]) WHO WILL ADMIT PT TO TELE OBS FOR ACS R/O. Final diagnoses:  Chest pain, rule out acute myocardial infarction        Isla Pence, MD 08/19/15 1719

## 2015-08-19 NOTE — ED Notes (Signed)
Patient c/o mid-sternal/epigatric pain that is intermittent. Patient reports shortness of breath and nausea. Per patient had MI with cardiac stents. Patient reports taking baby aspirin this morning. Per patient has nitro but did not take due to near syncopal episode last time he took it.

## 2015-08-20 ENCOUNTER — Observation Stay (HOSPITAL_COMMUNITY): Payer: Medicare HMO

## 2015-08-20 DIAGNOSIS — E785 Hyperlipidemia, unspecified: Secondary | ICD-10-CM

## 2015-08-20 DIAGNOSIS — I10 Essential (primary) hypertension: Secondary | ICD-10-CM

## 2015-08-20 DIAGNOSIS — Z955 Presence of coronary angioplasty implant and graft: Secondary | ICD-10-CM

## 2015-08-20 DIAGNOSIS — R079 Chest pain, unspecified: Secondary | ICD-10-CM | POA: Diagnosis not present

## 2015-08-20 DIAGNOSIS — I25118 Atherosclerotic heart disease of native coronary artery with other forms of angina pectoris: Secondary | ICD-10-CM

## 2015-08-20 DIAGNOSIS — R0602 Shortness of breath: Secondary | ICD-10-CM | POA: Diagnosis not present

## 2015-08-20 LAB — NM MYOCAR MULTI W/SPECT W/WALL MOTION / EF
CHL CUP NUCLEAR SRS: 4
CHL CUP NUCLEAR SSS: 8
CHL CUP RESTING HR STRESS: 71 {beats}/min
CHL RATE OF PERCEIVED EXERTION: 14
CSEPED: 5 min
CSEPEDS: 22 s
CSEPEW: 7 METS
CSEPPHR: 141 {beats}/min
LHR: 0.37
LV dias vol: 59 mL (ref 62–150)
LVSYSVOL: 20 mL
MPHR: 155 {beats}/min
Percent HR: 90 %
SDS: 4
TID: 0.83

## 2015-08-20 LAB — COMPREHENSIVE METABOLIC PANEL
ALT: 15 U/L — ABNORMAL LOW (ref 17–63)
ANION GAP: 6 (ref 5–15)
AST: 16 U/L (ref 15–41)
Albumin: 3.9 g/dL (ref 3.5–5.0)
Alkaline Phosphatase: 65 U/L (ref 38–126)
BUN: 16 mg/dL (ref 6–20)
CHLORIDE: 105 mmol/L (ref 101–111)
CO2: 27 mmol/L (ref 22–32)
CREATININE: 0.83 mg/dL (ref 0.61–1.24)
Calcium: 8.9 mg/dL (ref 8.9–10.3)
Glucose, Bld: 96 mg/dL (ref 65–99)
POTASSIUM: 3.5 mmol/L (ref 3.5–5.1)
SODIUM: 138 mmol/L (ref 135–145)
Total Bilirubin: 1 mg/dL (ref 0.3–1.2)
Total Protein: 6.5 g/dL (ref 6.5–8.1)

## 2015-08-20 LAB — CBC
HEMATOCRIT: 39.6 % (ref 39.0–52.0)
Hemoglobin: 14 g/dL (ref 13.0–17.0)
MCH: 32.9 pg (ref 26.0–34.0)
MCHC: 35.4 g/dL (ref 30.0–36.0)
MCV: 93 fL (ref 78.0–100.0)
PLATELETS: 149 10*3/uL — AB (ref 150–400)
RBC: 4.26 MIL/uL (ref 4.22–5.81)
RDW: 13 % (ref 11.5–15.5)
WBC: 6.8 10*3/uL (ref 4.0–10.5)

## 2015-08-20 LAB — TROPONIN I
Troponin I: <0.03 ng/mL (ref <0.031)
Troponin I: <0.03 ng/mL (ref <0.031)

## 2015-08-20 LAB — MAGNESIUM: Magnesium: 2 mg/dL (ref 1.7–2.4)

## 2015-08-20 MED ORDER — POTASSIUM CHLORIDE CRYS ER 20 MEQ PO TBCR
20.0000 meq | EXTENDED_RELEASE_TABLET | Freq: Once | ORAL | Status: AC
  Administered 2015-08-20: 20 meq via ORAL
  Filled 2015-08-20: qty 1

## 2015-08-20 MED ORDER — SODIUM CHLORIDE 0.9% FLUSH
INTRAVENOUS | Status: AC
  Administered 2015-08-20: 10 mL via INTRAVENOUS
  Filled 2015-08-20: qty 10

## 2015-08-20 MED ORDER — TECHNETIUM TC 99M TETROFOSMIN IV KIT
30.0000 | PACK | Freq: Once | INTRAVENOUS | Status: AC | PRN
  Administered 2015-08-20: 30 via INTRAVENOUS

## 2015-08-20 MED ORDER — TECHNETIUM TC 99M TETROFOSMIN IV KIT
10.0000 | PACK | Freq: Once | INTRAVENOUS | Status: AC | PRN
  Administered 2015-08-20: 10 via INTRAVENOUS

## 2015-08-20 MED ORDER — REGADENOSON 0.4 MG/5ML IV SOLN
INTRAVENOUS | Status: AC
  Filled 2015-08-20: qty 5

## 2015-08-20 NOTE — Care Management Note (Signed)
Case Management Note  Patient Details  Name: KARSYN STILTZ MRN: [id number] Date of Birth: 13-Oct-1950  Subjective/Objective: Spoke with patient who is alert and oriented from home. Stated that they can afford medications and are able to make thier medical appointments without difficulty. No DME or O2 at home.  Independent and drives self. No CM needs identified.                    Action/Plan: Home with self care.   Expected Discharge Date:                  Expected Discharge Plan:  Home/Self Care  In-House Referral:     Discharge planning Services  CM Consult  Post Acute Care Choice:    Choice offered to:     DME Arranged:    DME Agency:     HH Arranged:    [place] Agency:     Status of Service:  Completed, signed off  Medicare Important Message Given:    Date Medicare IM Given:    Medicare IM give by:    Date Additional Medicare IM Given:    Additional Medicare Important Message give by:     If discussed at [place] of Stay Meetings, dates discussed:    Additional Comments:  Alvie Heidelberg, RN 08/20/2015, 2:20 PM

## 2015-08-20 NOTE — Care Management Obs Status (Signed)
[place] NOTIFICATION   Patient Details  Name: Dennis [name] MRN: [id number] Date of Birth: [birth year]   Medicare Observation Status Notification Given:  Yes    Alvie Heidelberg, RN 08/20/2015, 10:31 AM

## 2015-08-20 NOTE — Progress Notes (Signed)
Dennis [name] discharged Home per MD order.  Discharge instructions reviewed and discussed with the patient, all questions and concerns answered. Copy of instructions  to patient.    Medication List    STOP taking these medications        ALEVE 220 MG Caps  Generic drug:  Naproxen Sodium      TAKE these medications        aspirin EC 81 MG EC tablet  Generic drug:  aspirin  Take 81 mg by mouth every morning.     atorvastatin 20 MG tablet  Commonly known as:  LIPITOR  Take 10 mg by mouth every evening.     fluticasone 50 MCG/ACT nasal spray  Commonly known as:  FLONASE  Place 1-2 sprays into both nostrils daily as needed for allergies or rhinitis.     hydrochlorothiazide 12.5 MG capsule  Commonly known as:  MICROZIDE  Take 1 capsule (12.5 mg total) by mouth daily.     isosorbide mononitrate 30 MG 24 hr tablet  Commonly known as:  IMDUR  TAKE ONE TABLET BY MOUTH ONCE DAILY     losartan 100 MG tablet  Commonly known as:  COZAAR  Take 100 mg by mouth daily.     omeprazole 20 MG capsule  Commonly known as:  PRILOSEC  Take 20 mg by mouth every morning.     Potassium Gluconate 595 MG Caps  Take 1 capsule by mouth daily.     verapamil 180 MG CR tablet  Commonly known as:  CALAN-SR  Take 180 mg by mouth every morning.        Patients skin is clean, dry and intact, no evidence of skin break down. IV site discontinued and catheter remains intact. Site without signs and symptoms of complications. Dressing and pressure applied.  Patient escorted to car by NT in a wheelchair,  no distress noted upon discharge.  Dennis [name] Dennis [name] 08/20/2015 6:41 PM

## 2015-08-20 NOTE — H&P (Signed)
CARDIOLOGY CONSULT NOTE   Patient ID: Dennis [name] MRN: [id number] DOB/AGE: March 29, 1951 65 y.o.  Admit Date: 08/19/2015 Referring Physician:Fagan,Roy MD Primary Physician: [name], MD Consulting Cardiologist: Kate Sable MD Primary Cardiologist Carlyle Dolly MD Reason for Consultation: Chest Pain with CAD  Clinical Summary Dennis [name] is a 65 y.o.male with known history of coronary artery disease, PCI with drug-eluting stent to the LAD, PTCA to the diagonal, and DES to the right coronary artery in 2009, follow-up stress MPI was completed in 2014 which was a low risk study and negative for ischemia, hypertension, hyperlipidemia, GERD, who presented to the emergency room with complaint of chest pain and dyspnea.  He states he was in his usual state of health while down to see neighbor about a lawnmower, was walking back and noticed some shortness of breath. He states he began to feel some pain epigastrically with pressure, low-grade nausea, mild diaphoresis but not significant to the patient. He states that the pain continued and he began to feel bad. Because his history he presented to the emergency room for further evaluation.  On arrival to the emergency room the patient's blood pressure was 142/86, heart rate 78, O2 sat 95% he was afebrile. He had mild leukocytosis with white blood cells of 11.0. EKG revealed normal sinus rhythm without evidence of ACS. Chest x-ray revealed no acute abnormality, CHF or pneumonia. He was admitted to rule out ACS and to cycle troponin. 3 follow-up troponin were negative, potassium this a.m. 3.5. White blood cells had normalized to 6.8. He was not given any treatment in the emergency room, pain has dissipated. He has not felt any further discomfort since admission.  No Known Allergies  Medications Scheduled Medications: . aspirin EC  81 mg Oral q morning - 10a  . atorvastatin  10 mg Oral QPM  . enoxaparin (LOVENOX) injection  40 mg  Subcutaneous Q24H  . hydrochlorothiazide  12.5 mg Oral Daily  . losartan  100 mg Oral Daily  . nitroGLYCERIN  1 inch Topical Q8H  . pantoprazole  40 mg Oral Daily  . sodium chloride flush  3 mL Intravenous Q12H  . sodium chloride flush  3 mL Intravenous Q12H  . verapamil  180 mg Oral q morning - 10a     Infusions:     PRN Medications:  sodium chloride, acetaminophen **OR** acetaminophen, fluticasone, sodium chloride flush   Past Medical History  Diagnosis Date  . Coronary artery disease   . Myocardial infarction ([place]) 1998  . Gout   . Hypercholesteremia   . GERD (gastroesophageal reflux disease)   . Hypertension     Past Surgical History  Procedure Laterality Date  . Colonoscopy    . Bilateral eye surgery    . Colonoscopy  05/14/2011    Procedure: COLONOSCOPY;  Surgeon: Daneil Dolin, MD;  [facility]: [facility];  Service: Endoscopy;  Laterality: N/A;  8:15 AM  . Cardiac surgery    . Cardiac catheterization  2009    PCI with DES to LAD,PTCA to diagonal, and DES to RCA LV gram LVEF 60%     Family History  Problem Relation Age of Onset  . Colon cancer Neg Hx   . Stroke Mother   . Heart attack Father     Social History Dennis [name] reports that he has never smoked. He has never used smokeless tobacco. Dennis [name] reports that he drinks about 3.0 oz of alcohol per week.  Review of Systems Complete review of systems  are found to be negative unless outlined in H&P above.  Physical Examination Blood pressure 109/71, pulse 62, temperature 97.8 F (36.6 C), temperature source Oral, resp. rate 16, height 5\' 6"  (1.676 m), weight 186 lb (84.369 kg), SpO2 99 %. No intake or output data in the 24 hours ending 08/20/15 0816  Telemetry: Normal sinus rhythm without arrhythmias  GEN: No acute distress HEENT: Conjunctiva and lids normal, oropharynx clear with moist mucosa. Neck: Supple, no elevated JVP or carotid bruits, no thyromegaly. Lungs: Clear to auscultation,  nonlabored breathing at rest. Cardiac: Regular rate and rhythm, no S3 or significant systolic murmur, no pericardial rub. Abdomen: Soft, nontender, negative Murphy sign, no hepatomegaly, bowel sounds present, no guarding or rebound. Extremities: No pitting edema, distal pulses 2+. Skin: Warm and dry. Musculoskeletal: No kyphosis. Neuropsychiatric: Alert and oriented x3, affect grossly appropriate.  Prior Cardiac Testing/Procedures 1.Cardiac Catheterization 06/28/2007 CONCLUSION: 1. Coronary artery disease, with 80% stenosis in the proximal LAD, 80%  ostial stenosis in the first diagonal branch, no significant  obstruction in the circumflex artery, and 90% stenosis in the  proximal to midright coronary artery, with normal LV function. 2. Successful PCI to the bifurcation lesion in the LAD diagonal  branch, with improvement in the LAD stenosis from 80% to 0% using a  Promus drug-eluting stent, and improvement in the diagonal lesion  from 80% to 50% using angioplasty alone. 3. Successful PCI of the lesion in the proximal to midright coronary  artery using a PLATINUM drug-eluting stent, with improvement in the  central narrowing from 90% to 0%.  MPI stress test: 02/01/2013 IMPRESSION: Low risk exercise Cardiolite. No chest pain reported, there were no diagnostic ST segment changes at maximum work load of 10.1 METS. Perfusion imaging is most consistent with inferior wall attenuation, no definite scar or ischemic defects. LV volumes are normal with LVEF 65% and no focal wall motion abnormalities.  Echocardiogram 01/12/2013 Left ventricle:  is actually mildThe cavity size was normal. There was mild concentric hypertrophy. Systolic function was normal. The estimated ejection fraction was in the range of 60% to 65%. Wall motion was normal; there were no regional wall motion abnormalities. Features are consistent with a pseudonormal left  ventricular filling pattern, with concomitant abnormal relaxation and increased filling pressure (grade 2 diastolic dysfunction). - Mitral valve: Trivial regurgitation. - Left atrium: The atrium was at the upper limits of normal in size. - Tricuspid valve: Physiologic regurgitation. - Pulmonary arteries: Systolic pressure could not be accurately estimated. - Inferior vena cava: Poorly visualized. Unable to estimate CVP. - Pericardium, extracardiac: There was no pericardial effusion.  Lab Results  Basic Metabolic Panel:  Recent Labs Lab 08/19/15 1628 08/20/15 0510  NA 137 138  K 3.9 3.5  CL 104 105  CO2 26 27  GLUCOSE 118* 96  BUN 16 16  CREATININE 0.89 0.83  CALCIUM 9.5 8.9    Liver Function Tests:  Recent Labs Lab 08/20/15 0510  AST 16  ALT 15*  ALKPHOS 65  BILITOT 1.0  PROT 6.5  ALBUMIN 3.9    CBC:  Recent Labs Lab 08/19/15 1628 08/20/15 0510  WBC 11.0* 6.8  HGB 14.9 14.0  HCT 43.1 39.6  MCV 92.9 93.0  PLT 180 149*    Cardiac Enzymes:  Recent Labs Lab 08/19/15 2007 08/20/15 0049 08/20/15 0510  TROPONINI <0.03 <0.03 <0.03    Radiology: Dg Chest 2 View  08/19/2015  CLINICAL DATA:  Substernal chest pain EXAM: CHEST  2 VIEW COMPARISON:  04/27/2014  FINDINGS: Cardiac shadow is within normal limits. Tortuosity of the thoracic aorta is noted. Lungs are clear bilaterally. No acute bony abnormality is seen. IMPRESSION: No acute abnormality noted. Electronically Signed   By: Inez Catalina M.D.   On: 08/19/2015 16:40     ECG: Normal sinus rhythm with nonspecific T-wave abnormalities, negative for ACS.    Impression and Recommendations 1. CAD: History of three-vessel disease with drug-eluting stents to the LAD, and RCA, with angioplasty to diagonal of LAD 2009. The patient has been doing well and medically compliant Intal yesterday when he began to have some dyspnea on exertion and pressure which was actually in the epigastric area just  below the sternum. Pain is atypical, and he cannot remember if this was similar to the type of pain he experienced prior to stent placements. Begin have some nausea and generalized fatigue "I just felt bad". Most recent stress test was in 2014 which is a low risk. Continue aspirin and statin. He has not currently on beta blocker but is on verapamil.   Troponin has been negative 3 and the patient is currently asymptomatic. We'll go ahead and plan a stress Myoview to evaluate for recurrent ischemia as his stents have been in place since 2009. It is reassuring that he is comfortable and without any discomfort. I discussed this with the patient and with Dr. Bronson Ing who agrees to proceed. If stress test is found to be negative, he will likely be released later this afternoon.  2. Chronic GERD: He is on PPI at home. We'll check magnesium level. Potassium was 3.5 this morning. We'll give him one dose. Patients with CAD will need to have a potassium of 4.0 or greater.  3. Hypertension: Continue losartan and verapamil. May consider changing to beta blocker for antianginal properties.  4. Hypercholesterolemia; Continue statin therapy.   Signed: Phill Myron. Lawrence NP [place]  08/20/2015, 8:16 AM   The patient was seen and examined, and I agree with the history, physical exam, assessment and plan as documented above which has been discussed with Arnold Long NP, with modifications as noted below. Pt with known CAD and prior PCI began to experience shortness of breath after walking 150 feet Later in the day experienced retrosternal chest pressure. No current symptoms.  Had been symptomatically stable since last office visit with Dr. Harl Bowie in 02/2015. ECG and troponins are normal. Takes Imdur 30 mg. Will plan for exercise Myoview stress test. Could consider increasing to Imdur to 60 mg but will await results of stress test.  Kate Sable, MD, [facility]  08/20/2015 9:08 AM   Co-Sign MD

## 2015-08-20 NOTE — Progress Notes (Signed)
Subjective: He was admitted with chest pain yesterday. He denies pain now.  Objective: Vital signs in last 24 hours: Filed Vitals:   08/19/15 1709 08/19/15 1826 08/19/15 2220 08/20/15 0500  BP: 121/81 125/67 113/76 109/71  Pulse: 72 71 69 62  Temp:  98.2 F (36.8 C) 97.8 F (36.6 C) 97.8 F (36.6 C)  TempSrc:  Oral Oral Oral  Resp: 15 18 17 16   Height:  5\' 6"  (1.676 m)    Weight:  185 lb 6.5 oz (84.1 kg)  186 lb (84.369 kg)  SpO2: 98% 97% 97% 99%   Weight change:  No intake or output data in the 24 hours ending 08/20/15 0735  Physical Exam: Alert. No distress. No JVD. Lungs clear. Heart regular with no murmurs. Abdomen nontender with no hepatosplenomegaly. No chest wall tenderness. Extremities reveal no edema.  Lab Results:    Results for orders placed or performed during the hospital encounter of 08/19/15 (from the past 24 hour(s))  Basic metabolic panel     Status: Abnormal   Collection Time: 08/19/15  4:28 PM  Result Value Ref Range   Sodium 137 135 - 145 mmol/L   Potassium 3.9 3.5 - 5.1 mmol/L   Chloride 104 101 - 111 mmol/L   CO2 26 22 - 32 mmol/L   Glucose, Bld 118 (H) 65 - 99 mg/dL   BUN 16 6 - 20 mg/dL   Creatinine, Ser 0.89 0.61 - 1.24 mg/dL   Calcium 9.5 8.9 - 10.3 mg/dL   GFR calc non Af Amer >60 >60 mL/min   GFR calc Af Amer >60 >60 mL/min   Anion gap 7 5 - 15  CBC     Status: Abnormal   Collection Time: 08/19/15  4:28 PM  Result Value Ref Range   WBC 11.0 (H) 4.0 - 10.5 K/uL   RBC 4.64 4.22 - 5.81 MIL/uL   Hemoglobin 14.9 13.0 - 17.0 g/dL   HCT 43.1 39.0 - 52.0 %   MCV 92.9 78.0 - 100.0 fL   MCH 32.1 26.0 - 34.0 pg   MCHC 34.6 30.0 - 36.0 g/dL   RDW 13.2 11.5 - 15.5 %   Platelets 180 150 - 400 K/uL  I-stat troponin, ED     Status: None   Collection Time: 08/19/15  4:32 PM  Result Value Ref Range   Troponin i, poc 0.00 0.00 - 0.08 ng/mL   Comment 3          Troponin I     Status: None   Collection Time: 08/19/15  8:07 PM  Result Value Ref  Range   Troponin I <0.03 <0.031 ng/mL  APTT     Status: None   Collection Time: 08/19/15  8:07 PM  Result Value Ref Range   aPTT 29 24 - 37 seconds  Protime-INR     Status: None   Collection Time: 08/19/15  8:07 PM  Result Value Ref Range   Prothrombin Time 14.4 11.6 - 15.2 seconds   INR 1.10 0.00 - 1.49  Troponin I     Status: None   Collection Time: 08/20/15 12:49 AM  Result Value Ref Range   Troponin I <0.03 <0.031 ng/mL  Troponin I     Status: None   Collection Time: 08/20/15  5:10 AM  Result Value Ref Range   Troponin I <0.03 <0.031 ng/mL  CBC     Status: Abnormal   Collection Time: 08/20/15  5:10 AM  Result Value Ref Range  WBC 6.8 4.0 - 10.5 K/uL   RBC 4.26 4.22 - 5.81 MIL/uL   Hemoglobin 14.0 13.0 - 17.0 g/dL   HCT 39.6 39.0 - 52.0 %   MCV 93.0 78.0 - 100.0 fL   MCH 32.9 26.0 - 34.0 pg   MCHC 35.4 30.0 - 36.0 g/dL   RDW 13.0 11.5 - 15.5 %   Platelets 149 (L) 150 - 400 K/uL  Comprehensive metabolic panel     Status: Abnormal   Collection Time: 08/20/15  5:10 AM  Result Value Ref Range   Sodium 138 135 - 145 mmol/L   Potassium 3.5 3.5 - 5.1 mmol/L   Chloride 105 101 - 111 mmol/L   CO2 27 22 - 32 mmol/L   Glucose, Bld 96 65 - 99 mg/dL   BUN 16 6 - 20 mg/dL   Creatinine, Ser 0.83 0.61 - 1.24 mg/dL   Calcium 8.9 8.9 - 10.3 mg/dL   Total Protein 6.5 6.5 - 8.1 g/dL   Albumin 3.9 3.5 - 5.0 g/dL   AST 16 15 - 41 U/L   ALT 15 (L) 17 - 63 U/L   Alkaline Phosphatase 65 38 - 126 U/L   Total Bilirubin 1.0 0.3 - 1.2 mg/dL   GFR calc non Af Amer >60 >60 mL/min   GFR calc Af Amer >60 >60 mL/min   Anion gap 6 5 - 15     ABGS No results for input(s): PHART, PO2ART, TCO2, HCO3 in the last 72 hours.  Invalid input(s): PCO2 CULTURES No results found for this or any previous visit (from the past 240 hour(s)). Studies/Results: Dg Chest 2 View  08/19/2015  CLINICAL DATA:  Substernal chest pain EXAM: CHEST  2 VIEW COMPARISON:  04/27/2014 FINDINGS: Cardiac shadow is within  normal limits. Tortuosity of the thoracic aorta is noted. Lungs are clear bilaterally. No acute bony abnormality is seen. IMPRESSION: No acute abnormality noted. Electronically Signed   By: Inez Catalina M.D.   On: 08/19/2015 16:40   Micro Results: No results found for this or any previous visit (from the past 240 hour(s)). Studies/Results: Dg Chest 2 View  08/19/2015  CLINICAL DATA:  Substernal chest pain EXAM: CHEST  2 VIEW COMPARISON:  04/27/2014 FINDINGS: Cardiac shadow is within normal limits. Tortuosity of the thoracic aorta is noted. Lungs are clear bilaterally. No acute bony abnormality is seen. IMPRESSION: No acute abnormality noted. Electronically Signed   By: Inez Catalina M.D.   On: 08/19/2015 16:40   Medications:  I have reviewed the patient's current medications Scheduled Meds: . aspirin EC  81 mg Oral q morning - 10a  . atorvastatin  10 mg Oral QPM  . enoxaparin (LOVENOX) injection  40 mg Subcutaneous Q24H  . hydrochlorothiazide  12.5 mg Oral Daily  . losartan  100 mg Oral Daily  . nitroGLYCERIN  1 inch Topical Q8H  . pantoprazole  40 mg Oral Daily  . sodium chloride flush  3 mL Intravenous Q12H  . sodium chloride flush  3 mL Intravenous Q12H  . verapamil  180 mg Oral q morning - 10a   Continuous Infusions:  PRN Meds:.sodium chloride, acetaminophen **OR** acetaminophen, fluticasone, sodium chloride flush   Assessment/Plan: #1. Chest pain. Coronary artery disease. Troponins are negative. EKG reveals normal sinus rhythm with no acute ischemic changes. Chest x-ray reveals no infiltrate. Cardiology consult pending. Stress test ordered on admission but will defer this decision to cardiology. Continue aspirin, atorvastatin, verapamil, losartan and hydrochlorothiazide. #2. Hypertension. Active Problems:  Hypertension   Hyperglycemia   Chest pain       Xolani Degracia 08/20/2015, 7:35 AM

## 2015-08-21 LAB — HEMOGLOBIN A1C
HEMOGLOBIN A1C: 5 % (ref 4.8–5.6)
MEAN PLASMA GLUCOSE: 97 mg/dL

## 2015-08-21 NOTE — Discharge Summary (Signed)
Physician Discharge Summary  Dennis [name] [id number] DOB: 04-Jul-1950 DOA: 08/19/2015   Admit date: 08/19/2015 Discharge date: 08/21/2015  Discharge Diagnoses:  Active Problems:   Hypertension   Hyperglycemia   Chest pain    Wt Readings from Last 3 Encounters:  08/20/15 186 lb (84.369 kg)  03/07/15 190 lb (86.183 kg)  08/25/14 191 lb (86.637 kg)     Hospital Course:  This patient is a 65 year old male who was admitted with chest pain. EKG revealed sinus rhythm with no acute ischemic changes. Chest x-ray revealed no acute infiltrate. Serial troponins were negative. Telemetry was unremarkable. 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 The study was felt to be low risk. Left ventricular function is normal. He had no further symptoms of pain. He was improved and stable for discharge on the afternoon of May 15. He'll continue his usual cardiovascular regimen. He'll be seen in follow-up in my office in one to 2 weeks.  Exam at discharge revealed clear lungs and a regular heart rhythm with no murmurs.  He'll continue omeprazole with supplemental dose of Pepcid at night if needed for esophageal reflux.   Discharge Instructions     Medication List    STOP taking these medications        ALEVE 220 MG Caps  Generic drug:  Naproxen Sodium      TAKE these medications        aspirin EC 81 MG EC tablet  Generic drug:  aspirin  Take 81 mg by mouth every morning.     atorvastatin 20 MG tablet  Commonly known as:  LIPITOR  Take 10 mg by mouth every evening.     fluticasone 50 MCG/ACT nasal spray  Commonly known as:  FLONASE  Place 1-2 sprays into both nostrils daily as needed for allergies or rhinitis.     hydrochlorothiazide 12.5 MG capsule  Commonly known as:  MICROZIDE  Take 1 capsule (12.5 mg total) by mouth daily.     isosorbide mononitrate 30 MG 24 hr tablet  Commonly  known as:  IMDUR  TAKE ONE TABLET BY MOUTH ONCE DAILY     losartan 100 MG tablet  Commonly known as:  COZAAR  Take 100 mg by mouth daily.     omeprazole 20 MG capsule  Commonly known as:  PRILOSEC  Take 20 mg by mouth every morning.     Potassium Gluconate 595 MG Caps  Take 1 capsule by mouth daily.     verapamil 180 MG CR tablet  Commonly known as:  CALAN-SR  Take 180 mg by mouth every morning.         Dennis [name] 08/21/2015

## 2015-08-29 DIAGNOSIS — R079 Chest pain, unspecified: Secondary | ICD-10-CM | POA: Diagnosis not present

## 2015-10-18 ENCOUNTER — Ambulatory Visit (INDEPENDENT_AMBULATORY_CARE_PROVIDER_SITE_OTHER): Payer: Medicare HMO | Admitting: Otolaryngology

## 2015-10-18 DIAGNOSIS — H903 Sensorineural hearing loss, bilateral: Secondary | ICD-10-CM | POA: Diagnosis not present

## 2015-10-18 DIAGNOSIS — H6123 Impacted cerumen, bilateral: Secondary | ICD-10-CM | POA: Diagnosis not present

## 2015-12-04 DIAGNOSIS — H5200 Hypermetropia, unspecified eye: Secondary | ICD-10-CM | POA: Diagnosis not present

## 2015-12-04 DIAGNOSIS — H2510 Age-related nuclear cataract, unspecified eye: Secondary | ICD-10-CM | POA: Diagnosis not present

## 2015-12-04 DIAGNOSIS — Z0100 Encounter for examination of eyes and vision without abnormal findings: Secondary | ICD-10-CM | POA: Diagnosis not present

## 2015-12-17 DIAGNOSIS — I2510 Atherosclerotic heart disease of native coronary artery without angina pectoris: Secondary | ICD-10-CM | POA: Diagnosis not present

## 2015-12-17 DIAGNOSIS — Z23 Encounter for immunization: Secondary | ICD-10-CM | POA: Diagnosis not present

## 2016-02-10 ENCOUNTER — Other Ambulatory Visit: Payer: Self-pay | Admitting: Cardiology

## 2016-03-12 DIAGNOSIS — R05 Cough: Secondary | ICD-10-CM | POA: Diagnosis not present

## 2016-03-12 DIAGNOSIS — R351 Nocturia: Secondary | ICD-10-CM | POA: Diagnosis not present

## 2016-03-12 DIAGNOSIS — N401 Enlarged prostate with lower urinary tract symptoms: Secondary | ICD-10-CM | POA: Diagnosis not present

## 2016-03-14 ENCOUNTER — Encounter: Payer: Self-pay | Admitting: Cardiology

## 2016-03-14 ENCOUNTER — Ambulatory Visit (INDEPENDENT_AMBULATORY_CARE_PROVIDER_SITE_OTHER): Payer: Medicare HMO | Admitting: Cardiology

## 2016-03-14 VITALS — BP 126/76 | HR 78 | Ht 66.0 in | Wt 193.0 lb

## 2016-03-14 DIAGNOSIS — E782 Mixed hyperlipidemia: Secondary | ICD-10-CM

## 2016-03-14 DIAGNOSIS — I10 Essential (primary) hypertension: Secondary | ICD-10-CM | POA: Diagnosis not present

## 2016-03-14 DIAGNOSIS — I2510 Atherosclerotic heart disease of native coronary artery without angina pectoris: Secondary | ICD-10-CM

## 2016-03-14 NOTE — Patient Instructions (Signed)

## 2016-03-14 NOTE — Progress Notes (Signed)
Clinical Summary Dennis [name] is a 65 y.o.male seen today for follow up of the following medical problems.    1. CAD  - cath 06/2007 in the setting of unstable angina, PCI with DES to LAD,PTCA to diagonal, and DES to RCA LV gram LVEF 60%  - 01/2013 Stress MPI low risk study, no ischemia - 01/2013 echo LVEF 60-65%, grade II diastolic dysfunction - 123456 exercise nuclear stress test: no ischemia    - no recent chest pain.  No recent SOB/DOE - compliant with meds.   2. HTN - compliant with meds - bp's at home typically 110s/70s  3. Hyperlipidemia - compliant with statin, prior mylagias on lipitor 20 mg daily, tolerates lipitor 10mg  daily.      SH: often travels to [place]. Recent traveled to [place]. Enjoys classic cars   Past Medical History:  Diagnosis Date  . Coronary artery disease   . GERD (gastroesophageal reflux disease)   . Gout   . Hypercholesteremia   . Hypertension   . Myocardial infarction 1998     No Known Allergies   Current Outpatient Prescriptions  Medication Sig Dispense Refill  . aspirin (ASPIRIN EC) 81 MG EC tablet Take 81 mg by mouth every morning.     Marland Kitchen atorvastatin (LIPITOR) 20 MG tablet Take 10 mg by mouth every evening.   1  . fluticasone (FLONASE) 50 MCG/ACT nasal spray Place 1-2 sprays into both nostrils daily as needed for allergies or rhinitis.    . hydrochlorothiazide (MICROZIDE) 12.5 MG capsule TAKE ONE CAPSULE BY MOUTH ONCE DAILY 90 capsule 3  . isosorbide mononitrate (IMDUR) 30 MG 24 hr tablet TAKE ONE TABLET BY MOUTH ONCE DAILY (Patient taking differently: TAKE ONE TABLET BY MOUTH ONCE DAILY IN THE EVENING) 90 tablet 4  . losartan (COZAAR) 100 MG tablet Take 100 mg by mouth daily.  1  . omeprazole (PRILOSEC) 20 MG capsule Take 20 mg by mouth every morning.     . Potassium Gluconate 595 MG CAPS Take 1 capsule by mouth daily.    . verapamil (CALAN-SR) 180 MG CR tablet Take 180 mg by mouth every morning.      No  current facility-administered medications for this visit.      Past Surgical History:  Procedure Laterality Date  . bilateral eye surgery    . CARDIAC CATHETERIZATION  2009   PCI with DES to LAD,PTCA to diagonal, and DES to RCA LV gram LVEF 60%   . CARDIAC SURGERY    . COLONOSCOPY    . COLONOSCOPY  05/14/2011   Procedure: COLONOSCOPY;  Surgeon: Daneil Dolin, MD;  Location: [facility];  Service: Endoscopy;  Laterality: N/A;  8:15 AM     No Known Allergies    Family History  Problem Relation Age of Onset  . Colon cancer Neg Hx   . Stroke Mother   . Heart attack Father      Social History Mr. Bault reports that he has never smoked. He has never used smokeless tobacco. Mr. Schillinger reports that he drinks about 3.0 oz of alcohol per week .   Review of Systems CONSTITUTIONAL: No weight loss, fever, chills, weakness or fatigue.  HEENT: Eyes: No visual loss, blurred vision, double vision or yellow sclerae.No hearing loss, sneezing, congestion, runny nose or sore throat.  SKIN: No rash or itching.  CARDIOVASCULAR: per hpi RESPIRATORY: No shortness of breath, cough or sputum.  GASTROINTESTINAL: No anorexia, nausea, vomiting or diarrhea. No abdominal  pain or blood.  GENITOURINARY: No burning on urination, no polyuria NEUROLOGICAL: No headache, dizziness, syncope, paralysis, ataxia, numbness or tingling in the extremities. No change in bowel or bladder control.  MUSCULOSKELETAL: No muscle, back pain, joint pain or stiffness.  LYMPHATICS: No enlarged nodes. No history of splenectomy.  PSYCHIATRIC: No history of depression or anxiety.  ENDOCRINOLOGIC: No reports of sweating, cold or heat intolerance. No polyuria or polydipsia.  Marland Kitchen   Physical Examination Vitals:   03/14/16 0810  BP: 126/76  Pulse: 78   Vitals:   03/14/16 0810  Weight: 193 lb (87.5 kg)  Height: 5\' 6"  (1.676 m)    Gen: resting comfortably, no acute distress HEENT: no scleral icterus, pupils equal  round and reactive, no palptable cervical adenopathy,  CV: RRR, no m/r/g, no jvd Resp: Clear to auscultation bilaterally GI: abdomen is soft, non-tender, non-distended, normal bowel sounds, no hepatosplenomegaly MSK: extremities are warm, no edema.  Skin: warm, no rash Neuro:  no focal deficits Psych: appropriate affect   Diagnostic Studies 01/2013 Stress MPI Analysis of the raw perfusion data finds radiotracer uptake in the gut adjacent to the inferior wall.  Tomographic views were obtained using the short axis, vertical long axis, and horizontal long axis planes. There is a small, mild to moderate intensity, inferior perfusion defect that is fixed. No significant reversible defects to indicate ischemia.  Gated imaging reveals an EDV of 70, ESV of 24, normal TID ratio of 0.89, and LVEF of 65% without wall motion abnormality.  IMPRESSION: Low risk exercise Cardiolite. No chest pain reported, there were no diagnostic ST segment changes at maximum work load of 10.1 METS. Perfusion imaging is most consistent with inferior wall attenuation, no definite scar or ischemic defects. LV volumes are normal with LVEF 65% and no focal wall motion abnormalities.  01/2013 Echo Study Conclusions  - Left ventricle: The cavity size was normal. There was mild concentric hypertrophy. Systolic function was normal. The estimated ejection fraction was in the range of 60% to 65%. Wall motion was normal; there were no regional wall motion abnormalities. Features are consistent with a pseudonormal left ventricular filling pattern, with concomitant abnormal relaxation and increased filling pressure (grade 2 diastolic dysfunction). - Mitral valve: Trivial regurgitation. - Left atrium: The atrium was at the upper limits of normal in size. - Tricuspid valve: Physiologic regurgitation. - Pulmonary arteries: Systolic pressure could not be accurately estimated. - Inferior vena cava:  Poorly visualized. Unable to estimate CVP. - Pericardium, extracardiac: There was no pericardial effusion. Impressions:  - Comparison to prior study March 2009. Mild LVH with LVEF 123456, grade 2 diastolic dysfunction. Upper normal left atrial size. Trivial mitral and tricuspid regurgitation. Unable to assess PASP or CVP.   08/2015 MPI  There was no ST segment deviation noted during stress.  Findings consistent with prior inferior myocardial infarction. There is no current ischemia  This is a low risk study.  The left ventricular ejection fraction is hyperdynamic (>65%).  [facility] treadmill score of 5, consistent with low risk for major cardiac events      Assessment and Plan  1. CAD - no current symptoms - continue current meds   2. HTN - he is at goal, continue current meds  3. Hyperlipidemia - at goal, we will continue current statin.  Prior myalgias on higher dose statin   F/u 1 year      Arnoldo Lenis, M.D.

## 2016-04-17 DIAGNOSIS — Z6831 Body mass index (BMI) 31.0-31.9, adult: Secondary | ICD-10-CM | POA: Diagnosis not present

## 2016-04-17 DIAGNOSIS — K921 Melena: Secondary | ICD-10-CM | POA: Diagnosis not present

## 2016-04-17 DIAGNOSIS — K602 Anal fissure, unspecified: Secondary | ICD-10-CM | POA: Diagnosis not present

## 2016-05-22 DIAGNOSIS — N451 Epididymitis: Secondary | ICD-10-CM | POA: Diagnosis not present

## 2016-05-25 ENCOUNTER — Emergency Department (HOSPITAL_COMMUNITY)
Admission: EM | Admit: 2016-05-25 | Discharge: 2016-05-25 | Disposition: A | Discharge status: Home / Self Care | Payer: Medicare HMO | Attending: Emergency Medicine | Admitting: Emergency Medicine

## 2016-05-25 ENCOUNTER — Encounter (HOSPITAL_COMMUNITY): Payer: Self-pay | Admitting: *Deleted

## 2016-05-25 ENCOUNTER — Emergency Department (HOSPITAL_COMMUNITY): Payer: Medicare HMO

## 2016-05-25 DIAGNOSIS — N433 Hydrocele, unspecified: Secondary | ICD-10-CM | POA: Diagnosis not present

## 2016-05-25 DIAGNOSIS — I252 Old myocardial infarction: Secondary | ICD-10-CM | POA: Insufficient documentation

## 2016-05-25 DIAGNOSIS — I10 Essential (primary) hypertension: Secondary | ICD-10-CM | POA: Insufficient documentation

## 2016-05-25 DIAGNOSIS — N451 Epididymitis: Secondary | ICD-10-CM | POA: Diagnosis not present

## 2016-05-25 DIAGNOSIS — Z7982 Long term (current) use of aspirin: Secondary | ICD-10-CM | POA: Diagnosis not present

## 2016-05-25 DIAGNOSIS — N50812 Left testicular pain: Secondary | ICD-10-CM | POA: Insufficient documentation

## 2016-05-25 DIAGNOSIS — I2510 Atherosclerotic heart disease of native coronary artery without angina pectoris: Secondary | ICD-10-CM | POA: Diagnosis not present

## 2016-05-25 LAB — BASIC METABOLIC PANEL
ANION GAP: 13 (ref 5–15)
BUN: 18 mg/dL (ref 6–20)
CO2: 26 mmol/L (ref 22–32)
Calcium: 9.3 mg/dL (ref 8.9–10.3)
Chloride: 102 mmol/L (ref 101–111)
Creatinine, Ser: 1.1 mg/dL (ref 0.61–1.24)
Glucose, Bld: 115 mg/dL — ABNORMAL HIGH (ref 65–99)
Potassium: 4.1 mmol/L (ref 3.5–5.1)
SODIUM: 141 mmol/L (ref 135–145)

## 2016-05-25 LAB — CBC WITH DIFFERENTIAL/PLATELET
BASOS ABS: 0 10*3/uL (ref 0.0–0.1)
BASOS PCT: 0 %
EOS PCT: 2 %
Eosinophils Absolute: 0.3 10*3/uL (ref 0.0–0.7)
HCT: 39.2 % (ref 39.0–52.0)
Hemoglobin: 13.9 g/dL (ref 13.0–17.0)
Lymphocytes Relative: 13 %
Lymphs Abs: 1.6 10*3/uL (ref 0.7–4.0)
MCH: 32.6 pg (ref 26.0–34.0)
MCHC: 35.5 g/dL (ref 30.0–36.0)
MCV: 92 fL (ref 78.0–100.0)
MONO ABS: 1 10*3/uL (ref 0.1–1.0)
Monocytes Relative: 8 %
Neutro Abs: 9.9 10*3/uL — ABNORMAL HIGH (ref 1.7–7.7)
Neutrophils Relative %: 77 %
PLATELETS: 183 10*3/uL (ref 150–400)
RBC: 4.26 MIL/uL (ref 4.22–5.81)
RDW: 12.7 % (ref 11.5–15.5)
WBC: 12.8 10*3/uL — AB (ref 4.0–10.5)

## 2016-05-25 LAB — URINALYSIS, ROUTINE W REFLEX MICROSCOPIC
Bilirubin Urine: NEGATIVE
GLUCOSE, UA: NEGATIVE mg/dL
KETONES UR: NEGATIVE mg/dL
NITRITE: NEGATIVE
PROTEIN: NEGATIVE mg/dL
Specific Gravity, Urine: 1.024 (ref 1.005–1.030)
Squamous Epithelial / LPF: NONE SEEN
pH: 5 (ref 5.0–8.0)

## 2016-05-25 MED ORDER — DOXYCYCLINE HYCLATE 100 MG PO CAPS: 100.0000 mg | ORAL_CAPSULE | Freq: Two times a day (BID) | ORAL | 0 refills | Status: DC

## 2016-05-25 MED ORDER — CEFTRIAXONE SODIUM 250 MG IJ SOLR
250.0000 mg | Freq: Once | INTRAMUSCULAR | Status: AC
  Administered 2016-05-25: 250 mg via INTRAMUSCULAR
  Filled 2016-05-25: qty 250

## 2016-05-25 MED ORDER — OXYCODONE-ACETAMINOPHEN 5-325 MG PO TABS
1.0000 | ORAL_TABLET | Freq: Once | ORAL | Status: AC
  Administered 2016-05-25: 1 via ORAL
  Filled 2016-05-25: qty 1

## 2016-05-25 MED ORDER — LIDOCAINE HCL (PF) 1 % IJ SOLN
INTRAMUSCULAR | Status: AC
  Administered 2016-05-25: 5 mL
  Filled 2016-05-25: qty 5

## 2016-05-25 MED ORDER — OXYCODONE-ACETAMINOPHEN 5-325 MG PO TABS: 1.0000 | ORAL_TABLET | ORAL | 0 refills | Status: DC | PRN

## 2016-05-25 NOTE — ED Notes (Signed)
Patient transported to Ultrasound 

## 2016-05-25 NOTE — ED Notes (Signed)
Pt understood dc material. NAD noted. Scripts given at dc 

## 2016-05-25 NOTE — ED Provider Notes (Signed)
[facility] DEPT Provider Note   CSN: [id number] Arrival date & time: 05/25/16  1732     History   Chief Complaint Chief Complaint  Patient presents with  . Testicle Pain  . Fever    HPI Dennis [name] is a 66 y.o. male.  The history is provided by the patient and medical records.  Testicle Pain   Fever      66 year old male with history of coronary artery disease, GERD, gout, hyperlipidemia, hypertension, presenting to the ED for left testicle pain and swelling. Reports he first noticed this on Thursday, 4 days ago. Reports he saw his primary care doctor and was started on Levaquin for suspected epididymitis. He reports he's been taking the antibiotics as directed but symptoms have been worsening. Reports he has been running a fever, 102F at home.  States the swelling has worsened and has increased pain, mostly with walking.  He denies any difficulty urinating. No discharge. No abdominal pain. No nausea or vomiting. Reports he did see a urologist, Dr. Karsten Ro, several years ago. States he did have a prostate biopsy but was told everything was normal and has not had follow-up since.  Past Medical History:  Diagnosis Date  . Coronary artery disease   . GERD (gastroesophageal reflux disease)   . Gout   . Hypercholesteremia   . Hypertension   . Myocardial infarction 1998    Patient Active Problem List   Diagnosis Date Noted  . Chest pain 08/19/2015  . Chest pain, rule out acute myocardial infarction 04/27/2014  . Hyperglycemia 04/27/2014  . Chest pain at rest 04/27/2014  . Gout   . Hypertension   . CHEST PAIN 11/20/2009  . Cardiovascular disease 08/26/2009  . GASTROESOPHAGEAL REFLUX DISEASE 08/26/2009  . HYPERLIPIDEMIA 01/15/2009  . RHINITIS, CHRONIC 01/15/2009    Past Surgical History:  Procedure Laterality Date  . bilateral eye surgery    . CARDIAC CATHETERIZATION  2009   PCI with DES to LAD,PTCA to diagonal, and DES to RCA LV gram LVEF 60%   . CARDIAC  SURGERY    . COLONOSCOPY    . COLONOSCOPY  05/14/2011   Procedure: COLONOSCOPY;  Surgeon: Daneil Dolin, MD;  Location: [facility];  Service: Endoscopy;  Laterality: N/A;  8:15 AM       Home Medications    Prior to Admission medications   Medication Sig Start Date End Date Taking? Authorizing Provider  aspirin (ASPIRIN EC) 81 MG EC tablet Take 81 mg by mouth every morning.     Historical Provider, MD  atorvastatin (LIPITOR) 20 MG tablet Take 10 mg by mouth every evening.  04/02/14   Historical Provider, MD  famotidine (PEPCID) 20 MG tablet Take 20 mg by mouth daily as needed for heartburn or indigestion.    Historical Provider, MD  fluticasone (FLONASE) 50 MCG/ACT nasal spray Place 1-2 sprays into both nostrils daily as needed for allergies or rhinitis.    Historical Provider, MD  hydrochlorothiazide (MICROZIDE) 12.5 MG capsule TAKE ONE CAPSULE BY MOUTH ONCE DAILY 02/11/16   Arnoldo Lenis, MD  isosorbide mononitrate (IMDUR) 30 MG 24 hr tablet TAKE ONE TABLET BY MOUTH ONCE DAILY Patient taking differently: TAKE ONE TABLET BY MOUTH ONCE DAILY IN THE EVENING 07/16/15   Arnoldo Lenis, MD  losartan (COZAAR) 100 MG tablet Take 100 mg by mouth daily. 02/19/14   Historical Provider, MD  omeprazole (PRILOSEC) 20 MG capsule Take 20 mg by mouth every morning.     Historical  Provider, MD  Potassium Gluconate 595 MG CAPS Take 1 capsule by mouth daily.    Historical Provider, MD  tamsulosin (FLOMAX) 0.4 MG CAPS capsule Take 0.4 mg by mouth daily.  03/12/16   Historical Provider, MD  verapamil (CALAN-SR) 180 MG CR tablet Take 180 mg by mouth every morning.     Historical Provider, MD    Family History Family History  Problem Relation Age of Onset  . Stroke Mother   . Heart attack Father   . Colon cancer Neg Hx     Social History Social History  Substance Use Topics  . Smoking status: Never Smoker  . Smokeless tobacco: Never Used  . Alcohol use 3.0 oz/week    5 Cans of beer per week       Comment: occ     Allergies   Patient has no known allergies.   Review of Systems Review of Systems  Constitutional: Positive for fever.  Genitourinary: Positive for testicular pain.  All other systems reviewed and are negative.    Physical Exam Updated Vital Signs BP 120/89 (BP Location: Left Arm)   Pulse 100   Temp 98 F (36.7 C) (Oral)   Resp 18   SpO2 97%   Physical Exam  Constitutional: He is oriented to person, place, and time. He appears well-developed and well-nourished.  HENT:  Head: Normocephalic and atraumatic.  Mouth/Throat: Oropharynx is clear and moist.  Eyes: Conjunctivae and EOM are normal. Pupils are equal, round, and reactive to light.  Neck: Normal range of motion.  Cardiovascular: Normal rate, regular rhythm and normal heart sounds.   Pulmonary/Chest: Effort normal and breath sounds normal.  Abdominal: Soft. Bowel sounds are normal.  Genitourinary:  Genitourinary Comments: Left testicle appears swollen and erythematous compared with the right, left testicle feels somewhat hardened internally and is tender to palpation; normal lie; no erythema extending into the groin or the thigh  Musculoskeletal: Normal range of motion.  Neurological: He is alert and oriented to person, place, and time.  Skin: Skin is warm and dry.  Psychiatric: He has a normal mood and affect.  Nursing note and vitals reviewed.    ED Treatments / Results  Labs (all labs ordered are listed, but only abnormal results are displayed) Labs Reviewed  URINALYSIS, ROUTINE W REFLEX MICROSCOPIC - Abnormal; Notable for the following:       Result Value   Hgb urine dipstick SMALL (*)    Leukocytes, UA SMALL (*)    Bacteria, UA RARE (*)    All other components within normal limits  CBC WITH DIFFERENTIAL/PLATELET - Abnormal; Notable for the following:    WBC 12.8 (*)    Neutro Abs 9.9 (*)    All other components within normal limits  BASIC METABOLIC PANEL - Abnormal; Notable for  the following:    Glucose, Bld 115 (*)    All other components within normal limits    EKG  EKG Interpretation None       Radiology US Scrotum  Result Date: 05/25/2016 CLINICAL DATA:  Patient with left testicular pain and swelling. Recently started antibiotics for present epididymitis. EXAM: SCROTAL ULTRASOUND DOPPLER ULTRASOUND OF THE TESTICLES TECHNIQUE: Complete ultrasound examination of the testicles, epididymis, and other scrotal structures was performed. Color and spectral Doppler ultrasound were also utilized to evaluate blood flow to the testicles. COMPARISON:  None. FINDINGS: Right testicle Measurements: 3.9 x 3.4 x 2.7 cm. No mass or microlithiasis visualized. Left testicle Measurements: 4.4 x 3.1 x 3.9 cm.  No mass or microlithiasis visualized. Increased color vascular flow. Right epididymis:  Normal in size and appearance. Left epididymis: Small epididymal head cyst versus spermatocele. Additionally the left epididymis is enlarged, echogenic and demonstrates increased color flow. Hydrocele:  Small left hydrocele. Varicocele:  Small bilateral varicoceles. Pulsed Doppler interrogation of both testes demonstrates normal low resistance arterial and venous waveforms bilaterally. IMPRESSION: The left epididymis is enlarged with increased color vascular flow compatible with epididymitis. Additionally there is increased vascularity of the left testicle suggestive of associated orchitis. Mildly complex left hydrocele. Electronically Signed   By: Lovey Newcomer M.D.   On: 05/25/2016 20:20   [place] Art/ven Flow Abd Pelv Doppler  Result Date: 05/25/2016 CLINICAL DATA:  Patient with left testicular pain and swelling. Recently started antibiotics for present epididymitis. EXAM: SCROTAL ULTRASOUND DOPPLER ULTRASOUND OF THE TESTICLES TECHNIQUE: Complete ultrasound examination of the testicles, epididymis, and other scrotal structures was performed. Color and spectral Doppler ultrasound were also utilized to  evaluate blood flow to the testicles. COMPARISON:  None. FINDINGS: Right testicle Measurements: 3.9 x 3.4 x 2.7 cm. No mass or microlithiasis visualized. Left testicle Measurements: 4.4 x 3.1 x 3.9 cm. No mass or microlithiasis visualized. Increased color vascular flow. Right epididymis:  Normal in size and appearance. Left epididymis: Small epididymal head cyst versus spermatocele. Additionally the left epididymis is enlarged, echogenic and demonstrates increased color flow. Hydrocele:  Small left hydrocele. Varicocele:  Small bilateral varicoceles. Pulsed Doppler interrogation of both testes demonstrates normal low resistance arterial and venous waveforms bilaterally. IMPRESSION: The left epididymis is enlarged with increased color vascular flow compatible with epididymitis. Additionally there is increased vascularity of the left testicle suggestive of associated orchitis. Mildly complex left hydrocele. Electronically Signed   By: Lovey Newcomer M.D.   On: 05/25/2016 20:20    Procedures Procedures (including critical care time)  Medications Ordered in ED Medications - No data to display   Initial Impression / Assessment and Plan / ED Course  I have reviewed the triage vital signs and the nursing notes.  Pertinent labs & imaging results that were available during my care of the patient were reviewed by me and considered in my medical decision making (see chart for details).  66 year old male here with left testicular pain and swelling. Has been on Levaquin from his PCP for suspected epididymitis for about 4 days now. Reports no improvement of his symptoms. Patient afebrile and nontoxic in appearance here.  Left testicle is swollen when compared with right. It is firm internally and tender to palpation.  No appreciable abscess or overlying cellulitis.  No lymphangitis.  Lab work with mild leukocytosis. Normal electrolytes and renal function. UA without signs of infection. Ultrasound obtained revealing  left epididymitis as well as likely orchitis. He also has a complex left hydrocele. Patient was given IM Rocephin here, will add doxycycline to his regimen. Percocet for pain. Will have him follow-up with urology later this week for recheck.  Discussed plan with patient, he acknowledged understanding and agreed with plan of care.  Return precautions given for new or worsening symptoms.  Case discussed with attending physician, Dr. Laverta Baltimore, who agrees with assessment and plan of care.  Final Clinical Impressions(s) / ED Diagnoses   Final diagnoses:  Pain in left testicle  Epididymitis    New Prescriptions Discharge Medication List as of 05/25/2016 10:05 PM    START taking these medications   Details  doxycycline (VIBRAMYCIN) 100 MG capsule Take 1 capsule (100 mg total) by mouth 2 (  two) times daily., Starting Sun 05/25/2016, Print    oxyCODONE-acetaminophen (PERCOCET/ROXICET) 5-325 MG tablet Take 1 tablet by mouth every 4 (four) hours as needed., Starting Sun 05/25/2016, Print         Larene Pickett, PA-C 05/25/16 [place], MD 05/27/16 1140

## 2016-05-25 NOTE — ED Triage Notes (Signed)
Pt reports onset Thursday of testicle pain. Has been to urologist and started on levaquin for epididymitis. Reports increase in swelling and pain today. Also reports temp > 102.

## 2016-05-25 NOTE — Discharge Instructions (Signed)
Take the prescribed medication as directed.  Continue taking the levaquin at home. Follow-up with urology-- call tomorrow to make an appt. Return to the ED for new or worsening symptoms.

## 2016-05-25 NOTE — ED Notes (Signed)
ED Provider at bedside. 

## 2016-05-28 DIAGNOSIS — N453 Epididymo-orchitis: Secondary | ICD-10-CM | POA: Diagnosis not present

## 2016-06-02 DIAGNOSIS — N451 Epididymitis: Secondary | ICD-10-CM | POA: Diagnosis not present

## 2016-06-02 DIAGNOSIS — N400 Enlarged prostate without lower urinary tract symptoms: Secondary | ICD-10-CM | POA: Diagnosis not present

## 2016-06-02 DIAGNOSIS — I2510 Atherosclerotic heart disease of native coronary artery without angina pectoris: Secondary | ICD-10-CM | POA: Diagnosis not present

## 2016-06-02 DIAGNOSIS — K219 Gastro-esophageal reflux disease without esophagitis: Secondary | ICD-10-CM | POA: Diagnosis not present

## 2016-06-02 DIAGNOSIS — Z79899 Other long term (current) drug therapy: Secondary | ICD-10-CM | POA: Diagnosis not present

## 2016-06-02 DIAGNOSIS — Z125 Encounter for screening for malignant neoplasm of prostate: Secondary | ICD-10-CM | POA: Diagnosis not present

## 2016-06-02 DIAGNOSIS — E785 Hyperlipidemia, unspecified: Secondary | ICD-10-CM | POA: Diagnosis not present

## 2016-06-10 DIAGNOSIS — Z6830 Body mass index (BMI) 30.0-30.9, adult: Secondary | ICD-10-CM | POA: Diagnosis not present

## 2016-06-10 DIAGNOSIS — Z23 Encounter for immunization: Secondary | ICD-10-CM | POA: Diagnosis not present

## 2016-06-10 DIAGNOSIS — I10 Essential (primary) hypertension: Secondary | ICD-10-CM | POA: Diagnosis not present

## 2016-06-10 DIAGNOSIS — I2510 Atherosclerotic heart disease of native coronary artery without angina pectoris: Secondary | ICD-10-CM | POA: Diagnosis not present

## 2016-06-10 DIAGNOSIS — Z0000 Encounter for general adult medical examination without abnormal findings: Secondary | ICD-10-CM | POA: Diagnosis not present

## 2016-07-04 DIAGNOSIS — Z6830 Body mass index (BMI) 30.0-30.9, adult: Secondary | ICD-10-CM | POA: Diagnosis not present

## 2016-07-04 DIAGNOSIS — I209 Angina pectoris, unspecified: Secondary | ICD-10-CM | POA: Diagnosis not present

## 2016-07-04 DIAGNOSIS — Z7982 Long term (current) use of aspirin: Secondary | ICD-10-CM | POA: Diagnosis not present

## 2016-07-04 DIAGNOSIS — E785 Hyperlipidemia, unspecified: Secondary | ICD-10-CM | POA: Diagnosis not present

## 2016-07-04 DIAGNOSIS — Z0000 Encounter for general adult medical examination without abnormal findings: Secondary | ICD-10-CM | POA: Diagnosis not present

## 2016-07-04 DIAGNOSIS — E669 Obesity, unspecified: Secondary | ICD-10-CM | POA: Diagnosis not present

## 2016-07-04 DIAGNOSIS — N401 Enlarged prostate with lower urinary tract symptoms: Secondary | ICD-10-CM | POA: Diagnosis not present

## 2016-07-04 DIAGNOSIS — K219 Gastro-esophageal reflux disease without esophagitis: Secondary | ICD-10-CM | POA: Diagnosis not present

## 2016-07-04 DIAGNOSIS — I10 Essential (primary) hypertension: Secondary | ICD-10-CM | POA: Diagnosis not present

## 2016-09-12 DIAGNOSIS — Z125 Encounter for screening for malignant neoplasm of prostate: Secondary | ICD-10-CM | POA: Diagnosis not present

## 2016-09-12 DIAGNOSIS — N400 Enlarged prostate without lower urinary tract symptoms: Secondary | ICD-10-CM | POA: Diagnosis not present

## 2016-09-14 DIAGNOSIS — R55 Syncope and collapse: Secondary | ICD-10-CM | POA: Diagnosis not present

## 2016-09-14 DIAGNOSIS — I517 Cardiomegaly: Secondary | ICD-10-CM | POA: Diagnosis not present

## 2016-09-16 DIAGNOSIS — R55 Syncope and collapse: Secondary | ICD-10-CM | POA: Diagnosis not present

## 2016-09-18 ENCOUNTER — Ambulatory Visit (INDEPENDENT_AMBULATORY_CARE_PROVIDER_SITE_OTHER): Payer: Medicare HMO | Admitting: Cardiology

## 2016-09-18 ENCOUNTER — Encounter: Payer: Self-pay | Admitting: Cardiology

## 2016-09-18 VITALS — BP 118/74 | HR 66 | Ht 66.0 in | Wt 187.0 lb

## 2016-09-18 DIAGNOSIS — I10 Essential (primary) hypertension: Secondary | ICD-10-CM

## 2016-09-18 DIAGNOSIS — R0789 Other chest pain: Secondary | ICD-10-CM | POA: Diagnosis not present

## 2016-09-18 DIAGNOSIS — E782 Mixed hyperlipidemia: Secondary | ICD-10-CM | POA: Diagnosis not present

## 2016-09-18 DIAGNOSIS — I2510 Atherosclerotic heart disease of native coronary artery without angina pectoris: Secondary | ICD-10-CM

## 2016-09-18 NOTE — Progress Notes (Signed)
Clinical Summary Dennis [name] is a 66 y.o.male seen today for follow up of the following medical problems.    1. CAD  - cath 06/2007 in the setting of unstable angina, PCI with DES to LAD,PTCA to diagonal, and DES to RCA LV gram LVEF 60%  - 01/2013 Stress MPI low risk study, no ischemia - 01/2013 echo LVEF 60-65%, grade II diastolic dysfunction - 0/4888 exercise nuclear stress test: no ischemia    - chest pressure started x 3 weeks. Can occur at rest or with exertion. Increased DOE. Pressure midchest, 1-2/10 in severity. No other associated symptoms. Lasts just a few seconds.  - unsure of frequency , stable severity. Can occur after eating.    2. HTN - compliant with meds - bp's at home typically 110s/70s  3. Hyperlipidemia - compliant with statin, prior mylagias on lipitor 20 mg daily, he had done well on  lipitor 10mg  daily.    4. Syncope - [place]. [place]-45 minutes - was walking, started feeling warm all over. Sat down to rest. Stood back up to walk to boat, continued to feel hot again.Sat down again, felt like he was going to pass out. Next thing he knew he was on the ground. - out for 1 minute  - had 2 cups of coffee that morning. No other oral intake.  - bps 90/60s at ER, improved with IVFs - never had prior episode    SH: often travels to [place]. Recent traveled to [place]. Enjoys classic cars. He was good friends with a patient of mine who recently passed away [name]. He still remains in touch with his wife Dennis [name] who is also a patient of mine.    Past Medical History:  Diagnosis Date  . Coronary artery disease   . GERD (gastroesophageal reflux disease)   . Gout   . Hypercholesteremia   . Hypertension   . Myocardial infarction 1998     No Known Allergies   Current Outpatient Prescriptions  Medication Sig Dispense Refill  . acetaminophen (TYLENOL) 500 MG tablet  Take 1,000 mg by mouth every 6 (six) hours as needed for mild pain.    Marland Kitchen aspirin (ASPIRIN EC) 81 MG EC tablet Take 81 mg by mouth every evening.     Marland Kitchen atorvastatin (LIPITOR) 20 MG tablet Take 10 mg by mouth every evening.   1  . azelastine (ASTELIN) 0.1 % nasal spray Place 1-2 sprays into both nostrils 2 (two) times daily as needed for rhinitis. Use in each nostril as directed    . doxycycline (VIBRAMYCIN) 100 MG capsule Take 1 capsule (100 mg total) by mouth 2 (two) times daily. 20 capsule 0  . famotidine (PEPCID) 20 MG tablet Take 20 mg by mouth daily as needed for heartburn or indigestion.    . fluticasone (FLONASE) 50 MCG/ACT nasal spray Place 1-2 sprays into both nostrils daily as needed for allergies or rhinitis.    . hydrochlorothiazide (MICROZIDE) 12.5 MG capsule TAKE ONE CAPSULE BY MOUTH ONCE DAILY 90 capsule 3  . isosorbide mononitrate (IMDUR) 30 MG 24 hr tablet TAKE ONE TABLET BY MOUTH ONCE DAILY (Patient taking differently: TAKE ONE TABLET BY MOUTH ONCE DAILY IN THE EVENING) 90 tablet 4  . levofloxacin (LEVAQUIN) 500 MG tablet Take 500 mg by mouth daily.    Marland Kitchen losartan (COZAAR) 100 MG tablet Take 100 mg by mouth daily.  1  . omeprazole (PRILOSEC)  20 MG capsule Take 20 mg by mouth every morning.     Marland Kitchen oxyCODONE-acetaminophen (PERCOCET/ROXICET) 5-325 MG tablet Take 1 tablet by mouth every 4 (four) hours as needed. 20 tablet 0  . Potassium Gluconate 595 MG CAPS Take 1 capsule by mouth daily.    . tamsulosin (FLOMAX) 0.4 MG CAPS capsule Take 0.4 mg by mouth daily.     . verapamil (CALAN-SR) 180 MG CR tablet Take 180 mg by mouth every morning.      No current facility-administered medications for this visit.      Past Surgical History:  Procedure Laterality Date  . bilateral eye surgery    . CARDIAC CATHETERIZATION  2009   PCI with DES to LAD,PTCA to diagonal, and DES to RCA LV gram LVEF 60%   . CARDIAC SURGERY    . COLONOSCOPY    . COLONOSCOPY  05/14/2011   Procedure: COLONOSCOPY;   Surgeon: Daneil Dolin, MD;  Location: [facility];  Service: Endoscopy;  Laterality: N/A;  8:15 AM     No Known Allergies    Family History  Problem Relation Age of Onset  . Stroke Mother   . Heart attack Father   . Colon cancer Neg Hx      Social History Dennis [name] reports that he has never smoked. He has never used smokeless tobacco. Dennis [name] reports that he drinks about 3.0 oz of alcohol per week .   Review of Systems CONSTITUTIONAL: No weight loss, fever, chills, weakness or fatigue.  HEENT: Eyes: No visual loss, blurred vision, double vision or yellow sclerae.No hearing loss, sneezing, congestion, runny nose or sore throat.  SKIN: No rash or itching.  CARDIOVASCULAR: per hpi RESPIRATORY: No shortness of breath, cough or sputum.  GASTROINTESTINAL: No anorexia, nausea, vomiting or diarrhea. No abdominal pain or blood.  GENITOURINARY: No burning on urination, no polyuria NEUROLOGICAL: No headache, dizziness, syncope, paralysis, ataxia, numbness or tingling in the extremities. No change in bowel or bladder control.  MUSCULOSKELETAL: No muscle, back pain, joint pain or stiffness.  LYMPHATICS: No enlarged nodes. No history of splenectomy.  PSYCHIATRIC: No history of depression or anxiety.  ENDOCRINOLOGIC: No reports of sweating, cold or heat intolerance. No polyuria or polydipsia.  Marland Kitchen   Physical Examination Vitals:   09/18/16 1318  BP: 118/74  Pulse: 66   Vitals:   09/18/16 1318  Weight: 187 lb (84.8 kg)  Height: 5\' 6"  (1.676 m)    Gen: resting comfortably, no acute distress HEENT: no scleral icterus, pupils equal round and reactive, no palptable cervical adenopathy,  CV: RRR, no m/r/g, no jvd Resp: Clear to auscultation bilaterally GI: abdomen is soft, non-tender, non-distended, normal bowel sounds, no hepatosplenomegaly MSK: extremities are warm, no edema.  Skin: warm, no rash Neuro:  no focal deficits Psych: appropriate affect   Diagnostic  Studies  01/2013 Stress MPI Analysis of the raw perfusion data finds radiotracer uptake in the gut adjacent to the inferior wall.  Tomographic views were obtained using the short axis, vertical long axis, and horizontal long axis planes. There is a small, mild to moderate intensity, inferior perfusion defect that is fixed. No significant reversible defects to indicate ischemia.  Gated imaging reveals an EDV of 70, ESV of 24, normal TID ratio of 0.89, and LVEF of 65% without wall motion abnormality.  IMPRESSION: Low risk exercise Cardiolite. No chest pain reported, there were no diagnostic ST segment changes at maximum work load of 10.1 METS. Perfusion imaging is most consistent  with inferior wall attenuation, no definite scar or ischemic defects. LV volumes are normal with LVEF 65% and no focal wall motion abnormalities.  01/2013 Echo Study Conclusions  - Left ventricle: The cavity size was normal. There was mild concentric hypertrophy. Systolic function was normal. The estimated ejection fraction was in the range of 60% to 65%. Wall motion was normal; there were no regional wall motion abnormalities. Features are consistent with a pseudonormal left ventricular filling pattern, with concomitant abnormal relaxation and increased filling pressure (grade 2 diastolic dysfunction). - Mitral valve: Trivial regurgitation. - Left atrium: The atrium was at the upper limits of normal in size. - Tricuspid valve: Physiologic regurgitation. - Pulmonary arteries: Systolic pressure could not be accurately estimated. - Inferior vena cava: Poorly visualized. Unable to estimate CVP. - Pericardium, extracardiac: There was no pericardial effusion. Impressions:  - Comparison to prior study March 2009. Mild LVH with LVEF 12-75%, grade 2 diastolic dysfunction. Upper normal left atrial size. Trivial mitral and tricuspid regurgitation. Unable to assess PASP or  CVP.   08/2015 MPI  There was no ST segment deviation noted during stress.  Findings consistent with prior inferior myocardial infarction. There is no current ischemia  This is a low risk study.  The left ventricular ejection fraction is hyperdynamic (>65%).  Duke treadmill score of 5, consistent with low risk for major cardiac events     Assessment and Plan   1. CAD - he has had intermittent atypical chest pain on and off over the last several years. Noninvaisve stress testing has been unremarkable, most recently in 08/2015. Continued symptoms at times. Would continue to monitor at this time, if progress consider cath. - EKG in clinic today SR, no ischemic changes  2. HTN - bp remains at goal, continue current meds  3. Hyperlipidemia.  Prior myalgias on higher dose statin - he will continue current meds  4. Syncope - appears to be related to hypovolemia. Counseled to increase oral hydration, particularly if going to be outside in the heat - no further workup at this time.    F/u 1 month     Arnoldo Lenis, M.D.

## 2016-09-18 NOTE — Patient Instructions (Signed)
Your physician recommends that you schedule a follow-up appointment in: 1 Month  Your physician recommends that you continue on your current medications as directed. Please refer to the Current Medication list given to you today.  If you need a refill on your cardiac medications before your next appointment, please call your pharmacy.  Thank you for choosing [facility]!    

## 2016-09-19 ENCOUNTER — Telehealth: Payer: Self-pay | Admitting: Cardiology

## 2016-09-19 MED ORDER — OMEPRAZOLE 40 MG PO CPDR: 40.0000 mg | DELAYED_RELEASE_CAPSULE | Freq: Every day | ORAL | 3 refills | Status: DC

## 2016-09-19 NOTE — Telephone Encounter (Signed)
Wife made aware

## 2016-09-19 NOTE — Telephone Encounter (Signed)
To dr branch

## 2016-09-19 NOTE — Telephone Encounter (Signed)
Ok to change omeprazole to 40mg  daily.    Zandra Abts MD

## 2016-09-19 NOTE — Telephone Encounter (Signed)
Patient states that Dr.Branch mentioned changing dosage of Omeprazole from 20mg  to 40mg . Do not see this in OV nor was it sent in. Please advise. / tg

## 2016-09-19 NOTE — Addendum Note (Signed)
Addended by: Barbarann Ehlers A on: 09/19/2016 10:52 AM   Modules accepted: Orders

## 2016-09-30 ENCOUNTER — Other Ambulatory Visit (HOSPITAL_BASED_OUTPATIENT_CLINIC_OR_DEPARTMENT_OTHER): Payer: Self-pay

## 2016-09-30 DIAGNOSIS — R0683 Snoring: Secondary | ICD-10-CM

## 2016-10-05 ENCOUNTER — Other Ambulatory Visit: Payer: Self-pay | Admitting: Cardiology

## 2016-10-17 NOTE — Progress Notes (Signed)
Cardiology Office Note   Date:  10/20/2016   ID:  Dennis [name], DOB 1950-08-01, MRN [id number]  PCP:  [name], MD  Cardiologist:  [facility] Chief Complaint  Patient presents with  . Coronary Artery Disease  . Hypertension     History of Present Illness: Dennis [name] is a 66 y.o. male who presents for ongoing assessment and management of coronary artery disease, PCI with drug-eluting stent to LAD, PTCA to diagonal, and drug-eluting stent to the right coronary artery in 2009, most recent exercise stress stress test in May 2017 was negative for ischemia. Other history includes hypertension, hyperlipidemia, and syncope.  When last seen in the office by Dr. Harl Bowie, the patient, note discussed need to proceed with cardiac catheterization and chest pain worsened. He apparently had many years of chronic chest pain with negative noninvasive stress testing. No changes were made in the medication regimen. He was felt that his syncope was related to hypovolemia and he was asked to make sure he stays hydrated.  He continues to have some dizziness, even thought he has increased his fluids. He occasionally has some sticking chest pain but not severe. He is medically compliant.    Past Medical History:  Diagnosis Date  . Coronary artery disease   . GERD (gastroesophageal reflux disease)   . Gout   . Hypercholesteremia   . Hypertension   . Myocardial infarction ([place]) 1998    Past Surgical History:  Procedure Laterality Date  . bilateral eye surgery    . CARDIAC CATHETERIZATION  2009   PCI with DES to LAD,PTCA to diagonal, and DES to RCA LV gram LVEF 60%   . CARDIAC SURGERY    . COLONOSCOPY    . COLONOSCOPY  05/14/2011   Procedure: COLONOSCOPY;  Surgeon: Daneil Dolin, MD;  Location: [facility];  Service: Endoscopy;  Laterality: N/A;  8:15 AM     Current Outpatient Prescriptions  Medication Sig Dispense Refill  . acetaminophen (TYLENOL) 500 MG tablet Take 1,000 mg by mouth  every 6 (six) hours as needed for mild pain.    Marland Kitchen aspirin (ASPIRIN EC) 81 MG EC tablet Take 81 mg by mouth every evening.     Marland Kitchen atorvastatin (LIPITOR) 20 MG tablet Take 10 mg by mouth every evening.   1  . azelastine (ASTELIN) 0.1 % nasal spray Place 1-2 sprays into both nostrils 2 (two) times daily as needed for rhinitis. Use in each nostril as directed    . famotidine (PEPCID) 20 MG tablet Take 20 mg by mouth daily as needed for heartburn or indigestion.    . fluticasone (FLONASE) 50 MCG/ACT nasal spray Place 1-2 sprays into both nostrils daily as needed for allergies or rhinitis.    . hydrochlorothiazide (MICROZIDE) 12.5 MG capsule TAKE ONE CAPSULE BY MOUTH ONCE DAILY 90 capsule 3  . isosorbide mononitrate (IMDUR) 30 MG 24 hr tablet TAKE ONE TABLET BY MOUTH ONCE DAILY 90 tablet 4  . losartan (COZAAR) 100 MG tablet Take 100 mg by mouth daily.  1  . omeprazole (PRILOSEC) 40 MG capsule Take 1 capsule (40 mg total) by mouth daily. 90 capsule 3  . Potassium 99 MG TABS Take 99 mg by mouth.    . tamsulosin (FLOMAX) 0.4 MG CAPS capsule Take 0.4 mg by mouth daily.     . verapamil (CALAN-SR) 180 MG CR tablet Take 180 mg by mouth every morning.      No current facility-administered medications for this visit.  Allergies:   Patient has no known allergies.    Social History:  The patient  reports that he has never smoked. He has never used smokeless tobacco. He reports that he drinks about 3.0 oz of alcohol per week . He reports that he does not use drugs.   Family History:  The patient's family history includes Heart attack in his father; Stroke in his mother.    ROS: All other systems are reviewed and negative. Unless otherwise mentioned in H&P    PHYSICAL EXAM: VS:  Ht 5\' 6"  (1.676 m)   Wt 189 lb (85.7 kg)   BMI 30.51 kg/m  , BMI Body mass index is 30.51 kg/m. GEN: Well nourished, well developed, in no acute distress  HEENT: normal  Neck: no JVD, carotid bruits, or masses Cardiac:  RRR; no murmurs, rubs, or gallops,no edema  Respiratory:  clear to auscultation bilaterally, normal work of breathing GI: soft, nontender, nondistended, + BS MS: no deformity or atrophy  Skin: warm and dry, no rash Neuro:  Strength and sensation are intact Psych: euthymic mood, full affect   Recent Labs: 05/25/2016: BUN 18; Creatinine, Ser 1.10; Hemoglobin 13.9; Platelets 183; Potassium 4.1; Sodium 141    Lipid Panel    Component Value Date/Time   CHOL 129 04/27/2014 1053   TRIG 143 04/27/2014 1053   HDL 37 (L) 04/27/2014 1053   CHOLHDL 3.5 04/27/2014 1053   VLDL 29 04/27/2014 1053   LDLCALC 63 04/27/2014 1053      Wt Readings from Last 3 Encounters:  10/20/16 189 lb (85.7 kg)  09/18/16 187 lb (84.8 kg)  03/14/16 193 lb (87.5 kg)      Other studies Reviewed: Cardia cath 08/06/2010 RESULTS:  The left main coronary.  The left main coronary was free of  significant disease.   The left anterior descending artery.  The left anterior descending  artery gave rise to a diagonal branch and septal perforator.  There were  50% and 80% stenoses in the proximal LAD, with segmental disease  overlapping the diagonal branch.  There was 80% ostial stenosis in the  diagonal branch, which represented a bifurcation lesion.   The circumflex artery.  The circumflex artery gave rise to three  posterolateral branches.  These vessels were free of significant  disease.   The right coronary artery.  The right coronary artery was a moderate-  sized vessel that gave rise to a right ventricular branch, a posterior  descending branch, and a small posterolateral branch.  There was 90%  stenosis in the proximal to midvessel, with some segmental disease.   The left ventriculogram.  The left ventriculogram performed in the RAO  projection showed good wall motion.  The estimated ejection fraction was  60%.  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   Following stenting of the lesion in  the LAD, the stenosis improved from  80% to 0%.   Following PTCA of the diagonal branch, the stenosis improved from 80% to  a residual 50% following stent deployment in the LAD.  This was treated  with angioplasty only.   The right coronary artery lesion.  The right coronary artery lesion  improved from 90% to 0% using a PLATINUM drug-eluting stent.   CONCLUSION:  1. Coronary artery disease, with 80% stenosis in the proximal LAD, 80%      ostial stenosis in the first diagonal branch, no significant      obstruction in the circumflex artery, and 90% stenosis in  the      proximal to midright coronary artery, with normal LV function.  2. Successful PCI to the bifurcation lesion in the LAD diagonal      branch, with improvement in the LAD stenosis from 80% to 0% using a      Promus drug-eluting stent, and improvement in the diagonal lesion      from 80% to 50% using angioplasty alone.  3. Successful PCI of the lesion in the proximal to midright coronary      artery using a PLATINUM drug-eluting stent, with improvement in the      central narrowing from 90% to 0%.   ASSESSMENT AND PLAN:  1. Syncope: Continues to have some dizziness, but no syncope. I have reviewed his medications and see that he is on diuretic. I will stop this daily dose, and have him take it prn LEE. BP is soft. This may help him fell better.   2. Hypertension: BP is soft. Remains on verapamil, losartan, and isosorbide. Changes as above  3. CAD: Continue secondary prevention with ASA, Statin, and BP control. He is concerned that he will have another heart attack because his stents are 67 years old. If he is symptomatic, can consider repeating stress test. Last one in 2014 was low risk.   4. Hypercholesterolemia: Continue statin therapy.      Current medicines are reviewed at length with the patient today.    Labs/ tests ordered today include:  Phill Myron. [place] Pugh, ANP, AACC   10/20/2016 1:29 PM    [facility] [address], [place], [place] [address] Phone: [phone]; Fax: [phone]

## 2016-10-19 ENCOUNTER — Ambulatory Visit: Payer: Medicare HMO | Attending: Internal Medicine | Admitting: Neurology

## 2016-10-19 DIAGNOSIS — Z79899 Other long term (current) drug therapy: Secondary | ICD-10-CM | POA: Diagnosis not present

## 2016-10-19 DIAGNOSIS — G4761 Periodic limb movement disorder: Secondary | ICD-10-CM | POA: Insufficient documentation

## 2016-10-19 DIAGNOSIS — G4733 Obstructive sleep apnea (adult) (pediatric): Secondary | ICD-10-CM | POA: Insufficient documentation

## 2016-10-19 DIAGNOSIS — Z7982 Long term (current) use of aspirin: Secondary | ICD-10-CM | POA: Diagnosis not present

## 2016-10-19 DIAGNOSIS — R0683 Snoring: Secondary | ICD-10-CM | POA: Diagnosis present

## 2016-10-20 ENCOUNTER — Encounter: Payer: Self-pay | Admitting: Adult Health

## 2016-10-20 ENCOUNTER — Ambulatory Visit (INDEPENDENT_AMBULATORY_CARE_PROVIDER_SITE_OTHER): Payer: Medicare HMO | Admitting: Adult Health

## 2016-10-20 VITALS — BP 110/76 | HR 70 | Ht 66.0 in | Wt 189.0 lb

## 2016-10-20 DIAGNOSIS — R42 Dizziness and giddiness: Secondary | ICD-10-CM

## 2016-10-20 DIAGNOSIS — I10 Essential (primary) hypertension: Secondary | ICD-10-CM

## 2016-10-20 DIAGNOSIS — I2510 Atherosclerotic heart disease of native coronary artery without angina pectoris: Secondary | ICD-10-CM | POA: Diagnosis not present

## 2016-10-20 MED ORDER — HYDROCHLOROTHIAZIDE 12.5 MG PO CAPS: 12.5000 mg | ORAL_CAPSULE | Freq: Every day | ORAL | 3 refills | Status: DC | PRN

## 2016-10-20 NOTE — Patient Instructions (Signed)
Your physician recommends that you schedule a follow-up appointment in: [place] has requested that you regularly monitor and record your blood pressure readings at home. Please use the same machine at the same time of day to check your readings and record them to bring to your follow-up visit.  Please bring your Blood pressure monitor with you to your visit.   If you need a refill on your cardiac medications before your next appointment, please call your pharmacy.  Thank you for choosing [facility]!

## 2016-10-25 NOTE — Procedures (Signed)
[place] A. Merlene Laughter, MD     [email or web address]             NOCTURNAL POLYSOMNOGRAPHY   LOCATION: [facility]  Patient Name: Dennis [name], Dennis [name] Date: 10/19/2016 Gender: Male D.O.B: [birth year] Age (years): 65 Referring Provider: [name] Height (inches): 66 Interpreting Physician: Phillips Odor MD, ABSM Weight (lbs): 187 RPSGT: Rosebud Poles BMI: 30 MRN: [id number] Neck Size: 16.50 CLINICAL INFORMATION Sleep Study Type: NPSG  Indication for sleep study: Snoring  Epworth Sleepiness Score:  SLEEP STUDY TECHNIQUE As per the AASM Manual for the Scoring of Sleep and Associated Events v2.3 (April 2016) with a hypopnea requiring 4% desaturations.  The channels recorded and monitored were frontal, central and occipital EEG, electrooculogram (EOG), submentalis EMG (chin), nasal and oral airflow, thoracic and abdominal wall motion, anterior tibialis EMG, snore microphone, electrocardiogram, and pulse oximetry.  MEDICATIONS Medications self-administered by patient taken the night of the study : N/A  Current Outpatient Prescriptions:  .  acetaminophen (TYLENOL) 500 MG tablet, Take 1,000 mg by mouth every 6 (six) hours as needed for mild pain., Disp: , Rfl:  .  aspirin (ASPIRIN EC) 81 MG EC tablet, Take 81 mg by mouth every evening. , Disp: , Rfl:  .  atorvastatin (LIPITOR) 20 MG tablet, Take 10 mg by mouth every evening. , Disp: , Rfl: 1 .  azelastine (ASTELIN) 0.1 % nasal spray, Place 1-2 sprays into both nostrils 2 (two) times daily as needed for rhinitis. Use in each nostril as directed, Disp: , Rfl:  .  famotidine (PEPCID) 20 MG tablet, Take 20 mg by mouth daily as needed for heartburn or indigestion., Disp: , Rfl:  .  fluticasone (FLONASE) 50 MCG/ACT nasal spray, Place 1-2 sprays into both nostrils daily as needed for allergies or rhinitis., Disp: , Rfl:  .  hydrochlorothiazide (MICROZIDE) 12.5 MG capsule, Take 1 capsule (12.5 mg total) by mouth daily as  needed., Disp: 90 capsule, Rfl: 3 .  isosorbide mononitrate (IMDUR) 30 MG 24 hr tablet, TAKE ONE TABLET BY MOUTH ONCE DAILY, Disp: 90 tablet, Rfl: 4 .  losartan (COZAAR) 100 MG tablet, Take 100 mg by mouth daily., Disp: , Rfl: 1 .  omeprazole (PRILOSEC) 40 MG capsule, Take 1 capsule (40 mg total) by mouth daily., Disp: 90 capsule, Rfl: 3 .  Potassium 99 MG TABS, Take 99 mg by mouth., Disp: , Rfl:  .  tamsulosin (FLOMAX) 0.4 MG CAPS capsule, Take 0.4 mg by mouth daily. , Disp: , Rfl:  .  verapamil (CALAN-SR) 180 MG CR tablet, Take 180 mg by mouth every morning. , Disp: , Rfl:    SLEEP ARCHITECTURE The study was initiated at 10:25:04 PM and ended at 5:35:57 AM.  Sleep onset time was 6.3 minutes and the sleep efficiency was 87.5%. The total sleep time was 377.1 minutes.  Stage REM latency was 73.5 minutes.  The patient spent 4.11% of the night in stage N1 sleep, 50.01% in stage N2 sleep, 30.50% in stage N3 and 15.38% in REM.  Alpha intrusion was absent.  Supine sleep was 18.43%.  RESPIRATORY PARAMETERS The overall apnea/hypopnea index (AHI) was 5 per hour. There were 19 total apneas, including 2 obstructive, 3 central and 14 mixed apneas. There were 12 hypopneas and 10 RERAs.  The AHI during Stage REM sleep was 4.1 per hour.  AHI while supine was 16.4 per hour.  The mean oxygen saturation was 93.09%. The minimum SpO2 during sleep was 88.00%.  Moderate snoring was noted during this  study.  CARDIAC DATA The 2 lead EKG demonstrated sinus rhythm. The mean heart rate was N/A beats per minute. Other EKG findings include: None. LEG MOVEMENT DATA The total PLMS were 53 with a resulting PLMS index of 8.43. Associated arousal with leg movement index was 0.5.  IMPRESSIONS - Mild obstructive sleep apnea not requiring positive pressure treatment occurred during this study. - Mild periodic limb movements of sleep occurred during the study. No significant associated arousals.   Delano Metz, MD Diplomate, American Board of Sleep Medicine.   ELECTRONICALLY SIGNED ON:  10/25/2016, 12:14 PM [place] PH: [phone]   FX: [phone] [place]

## 2016-11-03 ENCOUNTER — Ambulatory Visit (INDEPENDENT_AMBULATORY_CARE_PROVIDER_SITE_OTHER): Payer: Medicare HMO | Admitting: Otolaryngology

## 2016-11-03 DIAGNOSIS — H6121 Impacted cerumen, right ear: Secondary | ICD-10-CM

## 2016-11-03 DIAGNOSIS — H903 Sensorineural hearing loss, bilateral: Secondary | ICD-10-CM

## 2016-11-21 ENCOUNTER — Ambulatory Visit (INDEPENDENT_AMBULATORY_CARE_PROVIDER_SITE_OTHER): Payer: Medicare HMO | Admitting: Adult Health

## 2016-11-21 ENCOUNTER — Encounter: Payer: Self-pay | Admitting: Adult Health

## 2016-11-21 VITALS — BP 104/64 | HR 67 | Ht 66.0 in | Wt 190.0 lb

## 2016-11-21 DIAGNOSIS — E7800 Pure hypercholesterolemia, unspecified: Secondary | ICD-10-CM | POA: Diagnosis not present

## 2016-11-21 DIAGNOSIS — I10 Essential (primary) hypertension: Secondary | ICD-10-CM

## 2016-11-21 DIAGNOSIS — I2510 Atherosclerotic heart disease of native coronary artery without angina pectoris: Secondary | ICD-10-CM | POA: Diagnosis not present

## 2016-11-21 NOTE — Progress Notes (Signed)
Cardiology Office Note   Date:  11/21/2016   ID:  Dennis [name], DOB [birth year], MRN [id number]  PCP:  [name], MD  Cardiologist:  [facility]  Chief Complaint  Patient presents with  . Hypertension  . Coronary Artery Disease      History of Present Illness: Dennis [name] is a 66 y.o. male who presents for ongoing assessment and management of coronary artery disease, the patient has generally stent to the LAD, along with the right coronary artery in 2009, PTCA to the diagonal. Most recent stress Myoview in May 2017 was normal, with other history to include hyperlipidemia, hypertension, and remote history of syncope.  When last seen in the office on 10/20/2016 the patient continued to complain of dizziness but noted syncope. Daily dose of diuretic was discontinued and he was to take it when necessary for lower extremity edema. He was continued on Verapamil SR and isosorbide. Patient is zero follow-up to evaluate response to medication changes. He states he is feeling about the same. He has been keeping up with his blood pressure and recording it. He has had blood pressures low as 96/47 and his high as 156/101. He has been taking HCTZ once or twice a week only. He has been trying to avoid salty foods. He occasionally feels lightheaded after he takes the HCTZ the day prior.    Past Medical History:  Diagnosis Date  . Coronary artery disease   . GERD (gastroesophageal reflux disease)   . Gout   . Hypercholesteremia   . Hypertension   . Myocardial infarction ([place]) 1998    Past Surgical History:  Procedure Laterality Date  . bilateral eye surgery    . CARDIAC CATHETERIZATION  2009   PCI with DES to LAD,PTCA to diagonal, and DES to RCA LV gram LVEF 60%   . CARDIAC SURGERY    . COLONOSCOPY    . COLONOSCOPY  05/14/2011   Procedure: COLONOSCOPY;  Surgeon: Daneil Dolin, MD;  [facility];  Service: Endoscopy;  Laterality: N/A;  8:15 AM     Current Outpatient  Prescriptions  Medication Sig Dispense Refill  . acetaminophen (TYLENOL) 500 MG tablet Take 1,000 mg by mouth every 6 (six) hours as needed for mild pain.    Marland Kitchen aspirin (ASPIRIN EC) 81 MG EC tablet Take 81 mg by mouth every evening.     Marland Kitchen atorvastatin (LIPITOR) 20 MG tablet Take 10 mg by mouth every evening.   1  . azelastine (ASTELIN) 0.1 % nasal spray Place 1-2 sprays into both nostrils 2 (two) times daily as needed for rhinitis. Use in each nostril as directed    . famotidine (PEPCID) 20 MG tablet Take 20 mg by mouth daily as needed for heartburn or indigestion.    . fluticasone (FLONASE) 50 MCG/ACT nasal spray Place 1-2 sprays into both nostrils daily as needed for allergies or rhinitis.    . hydrochlorothiazide (MICROZIDE) 12.5 MG capsule Take 1 capsule (12.5 mg total) by mouth daily as needed. 90 capsule 3  . isosorbide mononitrate (IMDUR) 30 MG 24 hr tablet TAKE ONE TABLET BY MOUTH ONCE DAILY 90 tablet 4  . losartan (COZAAR) 100 MG tablet Take 100 mg by mouth daily.  1  . omeprazole (PRILOSEC) 40 MG capsule Take 1 capsule (40 mg total) by mouth daily. 90 capsule 3  . Potassium 99 MG TABS Take 99 mg by mouth.    . tamsulosin (FLOMAX) 0.4 MG CAPS capsule Take 0.4 mg by mouth  daily.     . verapamil (CALAN-SR) 180 MG CR tablet Take 180 mg by mouth every morning.      No current facility-administered medications for this visit.     Allergies:   Patient has no known allergies.    Social History:  The patient  reports that he has never smoked. He has never used smokeless tobacco. He reports that he drinks about 3.0 oz of alcohol per week . He reports that he does not use drugs.   Family History:  The patient's family history includes Heart attack in his father; Stroke in his mother.    ROS: All other systems are reviewed and negative. Unless otherwise mentioned in H&P    PHYSICAL EXAM: VS:  BP 104/64   Pulse 67   Ht 5\' 6"  (1.676 m)   Wt 190 lb (86.2 kg)   SpO2 96%   BMI 30.67 kg/m   , BMI Body mass index is 30.67 kg/m. GEN: Well nourished, well developed, in no acute distress  HEENT: normal  Neck: no JVD, carotid bruits, or masses Cardiac: RRR; no murmurs, rubs, or gallops,no edema  Respiratory:  clear to auscultation bilaterally, normal work of breathing GI: soft, nontender, nondistended, + BS MS: no deformity or atrophy  Skin: warm and dry, no rash Neuro:  Strength and sensation are intact Psych: euthymic mood, full affect   Recent Labs: 05/25/2016: BUN 18; Creatinine, Ser 1.10; Hemoglobin 13.9; Platelets 183; Potassium 4.1; Sodium 141    Lipid Panel    Component Value Date/Time   CHOL 129 04/27/2014 1053   TRIG 143 04/27/2014 1053   HDL 37 (L) 04/27/2014 1053   CHOLHDL 3.5 04/27/2014 1053   VLDL 29 04/27/2014 1053   LDLCALC 63 04/27/2014 1053      Wt Readings from Last 3 Encounters:  11/21/16 190 lb (86.2 kg)  10/20/16 189 lb (85.7 kg)  09/18/16 187 lb (84.8 kg)      Other studies Reviewed: Review of the above records demonstrates:  Echocardiogram 02/01/2013 Left ventricle: The cavity size was normal. There was mild concentric hypertrophy. Systolic function was normal. The estimated ejection fraction was in the range of 60% to 65%. Wall motion was normal; there were no regional wall motion abnormalities. Features are consistent with a pseudonormal left ventricular filling pattern, with concomitant abnormal relaxation and increased filling pressure (grade 2 diastolic dysfunction). - Mitral valve: Trivial regurgitation. - Left atrium: The atrium was at the upper limits of normal in size. - Tricuspid valve: Physiologic regurgitation. - Pulmonary arteries: Systolic pressure could not be accurately estimated. - Inferior vena cava: Poorly visualized. Unable to estimate CVP. - Pericardium, extracardiac: There was no pericardial effusion.  ASSESSMENT AND PLAN:  1. Hypertension: He brings with him his blood pressure  recordings. He has normal trends with exception of occasional very hypotensive readings of 97/48 and occasional higher readings of 157/101 . He occasionally uses HCTZ. Notices that he is little lightheaded and dizzy the following day. I've advised him against using HCTZ unnecessarily. He will have blood pressure fluctuations. If his blood pressure remains very elevated above 160/101 consistently he will need to take the HCTZ. I most part, blood pressure ranges are normal will not make any changes at this time.   2. CAD: No complaints of angina, no dyspnea. He will remain on aspirin a pressure control and statin. No changes in his medication regimen.   Current medicines are reviewed at length with the patient today.    Labs/ tests  ordered today include:  Phill Myron. West Pugh, ANP, AACC   11/21/2016 3:11 PM    [facility] [address] [place], [place] [address] Phone: [phone]; Fax: [phone]

## 2016-11-21 NOTE — Patient Instructions (Signed)
Medication Instructions:  Your physician recommends that you continue on your current medications as directed. Please refer to the Current Medication list given to you today.   Labwork: NONE   Testing/Procedures: NONE   Follow-Up: Your physician wants you to follow-up in: 6 MONTHS with Dr. Branch. You will receive a reminder letter in the mail two months in advance. If you don't receive a letter, please call our office to schedule the follow-up appointment.   Any Other Special Instructions Will Be Listed Below (If Applicable).     If you need a refill on your cardiac medications before your next appointment, please call your pharmacy. Thank you for choosing [facility]!    

## 2016-12-09 DIAGNOSIS — R1013 Epigastric pain: Secondary | ICD-10-CM | POA: Diagnosis not present

## 2017-01-08 DIAGNOSIS — Z23 Encounter for immunization: Secondary | ICD-10-CM | POA: Diagnosis not present

## 2017-01-15 DIAGNOSIS — N390 Urinary tract infection, site not specified: Secondary | ICD-10-CM | POA: Diagnosis not present

## 2017-01-15 DIAGNOSIS — N3000 Acute cystitis without hematuria: Secondary | ICD-10-CM | POA: Diagnosis not present

## 2017-04-02 ENCOUNTER — Other Ambulatory Visit: Payer: Self-pay

## 2017-04-02 ENCOUNTER — Encounter (HOSPITAL_COMMUNITY): Payer: Self-pay

## 2017-04-02 ENCOUNTER — Observation Stay (HOSPITAL_COMMUNITY)
Admission: EM | Admit: 2017-04-02 | Discharge: 2017-04-03 | Disposition: A | Discharge status: Home / Self Care | Payer: Medicare HMO | Attending: Internal Medicine | Admitting: Internal Medicine

## 2017-04-02 ENCOUNTER — Emergency Department (HOSPITAL_COMMUNITY): Payer: Medicare HMO

## 2017-04-02 DIAGNOSIS — R079 Chest pain, unspecified: Secondary | ICD-10-CM

## 2017-04-02 DIAGNOSIS — I10 Essential (primary) hypertension: Secondary | ICD-10-CM | POA: Diagnosis not present

## 2017-04-02 DIAGNOSIS — Z79899 Other long term (current) drug therapy: Secondary | ICD-10-CM | POA: Insufficient documentation

## 2017-04-02 DIAGNOSIS — I252 Old myocardial infarction: Secondary | ICD-10-CM | POA: Diagnosis not present

## 2017-04-02 DIAGNOSIS — I25119 Atherosclerotic heart disease of native coronary artery with unspecified angina pectoris: Secondary | ICD-10-CM | POA: Diagnosis not present

## 2017-04-02 DIAGNOSIS — R0789 Other chest pain: Principal | ICD-10-CM | POA: Insufficient documentation

## 2017-04-02 DIAGNOSIS — Z7982 Long term (current) use of aspirin: Secondary | ICD-10-CM | POA: Diagnosis not present

## 2017-04-02 DIAGNOSIS — I2510 Atherosclerotic heart disease of native coronary artery without angina pectoris: Secondary | ICD-10-CM | POA: Insufficient documentation

## 2017-04-02 DIAGNOSIS — I209 Angina pectoris, unspecified: Secondary | ICD-10-CM | POA: Diagnosis present

## 2017-04-02 LAB — TROPONIN I

## 2017-04-02 LAB — BASIC METABOLIC PANEL
ANION GAP: 13 (ref 5–15)
BUN: 21 mg/dL — ABNORMAL HIGH (ref 6–20)
CALCIUM: 9.6 mg/dL (ref 8.9–10.3)
CO2: 24 mmol/L (ref 22–32)
CREATININE: 0.99 mg/dL (ref 0.61–1.24)
Chloride: 103 mmol/L (ref 101–111)
Glucose, Bld: 96 mg/dL (ref 65–99)
Potassium: 3.5 mmol/L (ref 3.5–5.1)
Sodium: 140 mmol/L (ref 135–145)

## 2017-04-02 LAB — CBC
HCT: 44.7 % (ref 39.0–52.0)
HEMOGLOBIN: 15.6 g/dL (ref 13.0–17.0)
MCH: 32.2 pg (ref 26.0–34.0)
MCHC: 34.9 g/dL (ref 30.0–36.0)
MCV: 92.4 fL (ref 78.0–100.0)
PLATELETS: 156 10*3/uL (ref 150–400)
RBC: 4.84 MIL/uL (ref 4.22–5.81)
RDW: 13.1 % (ref 11.5–15.5)
WBC: 8.8 10*3/uL (ref 4.0–10.5)

## 2017-04-02 MED ORDER — FLUTICASONE PROPIONATE 50 MCG/ACT NA SUSP: 1.0000 | Freq: Every day | NASAL | Status: DC | PRN

## 2017-04-02 MED ORDER — HYDROCHLOROTHIAZIDE 12.5 MG PO CAPS
12.5000 mg | ORAL_CAPSULE | Freq: Every day | ORAL | Status: DC
  Administered 2017-04-03: 12.5 mg via ORAL
  Filled 2017-04-02: qty 1

## 2017-04-02 MED ORDER — POTASSIUM 99 MG PO TABS: 95.0000 mg | ORAL_TABLET | Freq: Every day | ORAL | Status: DC

## 2017-04-02 MED ORDER — ALPRAZOLAM 0.25 MG PO TABS: 0.2500 mg | ORAL_TABLET | Freq: Two times a day (BID) | ORAL | Status: DC | PRN

## 2017-04-02 MED ORDER — ACETAMINOPHEN 325 MG PO TABS: 650.0000 mg | ORAL_TABLET | ORAL | Status: DC | PRN

## 2017-04-02 MED ORDER — ATORVASTATIN CALCIUM 10 MG PO TABS
10.0000 mg | ORAL_TABLET | Freq: Every day | ORAL | Status: DC
Start: 2017-04-02 — End: 2017-04-03
  Administered 2017-04-02: 10 mg via ORAL
  Filled 2017-04-02: qty 1

## 2017-04-02 MED ORDER — NITROGLYCERIN 0.4 MG SL SUBL: 0.4000 mg | SUBLINGUAL_TABLET | SUBLINGUAL | Status: DC | PRN

## 2017-04-02 MED ORDER — TAMSULOSIN HCL 0.4 MG PO CAPS
0.4000 mg | ORAL_CAPSULE | Freq: Every day | ORAL | Status: DC
  Administered 2017-04-03: 0.4 mg via ORAL
  Filled 2017-04-02: qty 1

## 2017-04-02 MED ORDER — LOSARTAN POTASSIUM 50 MG PO TABS
100.0000 mg | ORAL_TABLET | Freq: Every day | ORAL | Status: DC
  Administered 2017-04-03: 100 mg via ORAL
  Filled 2017-04-02: qty 2

## 2017-04-02 MED ORDER — ENOXAPARIN SODIUM 40 MG/0.4ML ~~LOC~~ SOLN
SUBCUTANEOUS | Status: AC
  Filled 2017-04-02: qty 0.4

## 2017-04-02 MED ORDER — ZOLPIDEM TARTRATE 5 MG PO TABS: 5.0000 mg | ORAL_TABLET | Freq: Every evening | ORAL | Status: DC | PRN

## 2017-04-02 MED ORDER — ASPIRIN EC 81 MG PO TBEC: 81.0000 mg | DELAYED_RELEASE_TABLET | Freq: Every evening | ORAL | Status: DC

## 2017-04-02 MED ORDER — PANTOPRAZOLE SODIUM 40 MG PO TBEC
40.0000 mg | DELAYED_RELEASE_TABLET | Freq: Every day | ORAL | Status: DC
  Administered 2017-04-03: 40 mg via ORAL
  Filled 2017-04-02: qty 1

## 2017-04-02 MED ORDER — ONDANSETRON HCL 4 MG/2ML IJ SOLN: 4.0000 mg | Freq: Four times a day (QID) | INTRAMUSCULAR | Status: DC | PRN

## 2017-04-02 MED ORDER — MORPHINE SULFATE (PF) 2 MG/ML IV SOLN: 1.0000 mg | INTRAVENOUS | Status: DC | PRN

## 2017-04-02 MED ORDER — ISOSORBIDE MONONITRATE ER 30 MG PO TB24
30.0000 mg | ORAL_TABLET | Freq: Every day | ORAL | Status: DC
  Administered 2017-04-02: 30 mg via ORAL
  Filled 2017-04-02: qty 1

## 2017-04-02 MED ORDER — VERAPAMIL HCL ER 180 MG PO TBCR
180.0000 mg | EXTENDED_RELEASE_TABLET | Freq: Every morning | ORAL | Status: DC
  Administered 2017-04-03: 180 mg via ORAL
  Filled 2017-04-02: qty 1

## 2017-04-02 MED ORDER — ENOXAPARIN SODIUM 40 MG/0.4ML ~~LOC~~ SOLN
40.0000 mg | SUBCUTANEOUS | Status: DC
  Administered 2017-04-02: 40 mg via SUBCUTANEOUS

## 2017-04-02 MED ORDER — ASPIRIN 81 MG PO CHEW
324.0000 mg | CHEWABLE_TABLET | Freq: Once | ORAL | Status: AC
  Administered 2017-04-02: 324 mg via ORAL
  Filled 2017-04-02: qty 4

## 2017-04-02 NOTE — ED Provider Notes (Signed)
[facility] EMERGENCY DEPARTMENT Provider Note   CSN: [id number] Arrival date & time: 04/02/17  1717     History   Chief Complaint Chief Complaint  Patient presents with  . Chest Pain    HPI Dennis [name] is a 66 y.o. male.  HPI  Patient is a 66 year old male with a known history of high blood pressure high cholesterol myocardial infarction, known ischemic coronary disease status post stenting over 10 years ago who presents from his family doctor's office after he was seen today for several days of worsening chest discomfort with associated pain in his shoulders and across his upper back.  He states it comes on intermittently, it is not positional or related to deep breathing or eating, he does not exert himself very much and cannot state whether it is related to exertion.  There is no fevers shortness of breath nausea or diaphoresis associated with it.  He denies swelling of the legs and denies any coughing.  He reports that this pain will last for several minutes, nothing seems to make it go away, he does take his baby aspirin daily.  He was sent here from his doctor's office for further evaluation.  PCP Dr. Willey Blade  Past Medical History:  Diagnosis Date  . Coronary artery disease   . GERD (gastroesophageal reflux disease)   . Gout   . Hypercholesteremia   . Hypertension   . Myocardial infarction [facility]) 1998    Patient Active Problem List   Diagnosis Date Noted  . Chest pain 08/19/2015  . Hyperglycemia 04/27/2014  . Gout   . Hypertension   . Cardiovascular disease 08/26/2009  . GASTROESOPHAGEAL REFLUX DISEASE 08/26/2009  . HYPERLIPIDEMIA 01/15/2009  . RHINITIS, CHRONIC 01/15/2009    Past Surgical History:  Procedure Laterality Date  . bilateral eye surgery    . CARDIAC CATHETERIZATION  2009   PCI with DES to LAD,PTCA to diagonal, and DES to RCA LV gram LVEF 60%   . CARDIAC SURGERY    . COLONOSCOPY    . COLONOSCOPY  05/14/2011   Procedure: COLONOSCOPY;   Surgeon: Daneil Dolin, MD;  Location: [facility] ENDO [facility]ITE;  Service: Endoscopy;  Laterality: N/A;  8:15 AM       Home Medications    Prior to Admission medications   Medication Sig Start Date End Date Taking? Authorizing Provider  acetaminophen (TYLENOL) 500 MG tablet Take 1,000 mg by mouth every 6 (six) hours as needed for mild pain.   Yes [provider]  aspirin (ASPIRIN EC) 81 MG EC tablet Take 81 mg by mouth every evening.    Yes [provider]  atorvastatin (LIPITOR) 20 MG tablet Take 10 mg by mouth every evening.  04/02/14  Yes [provider]  azelastine (ASTELIN) 0.1 % nasal spray Place 1-2 sprays into both nostrils 2 (two) times daily as needed for rhinitis. Use in each nostril as directed   Yes [provider]  fluticasone (FLONASE) 50 MCG/ACT nasal spray Place 1-2 sprays into both nostrils daily as needed for allergies or rhinitis.   Yes [provider]  hydrochlorothiazide (MICROZIDE) 12.5 MG capsule Take 1 capsule (12.5 mg total) by mouth daily as needed. 10/20/16 04/02/17 Yes Lendon Colonel, NP  isosorbide mononitrate (IMDUR) 30 MG 24 hr tablet TAKE ONE TABLET BY MOUTH ONCE DAILY 10/06/16  Yes Branch, [name], MD  losartan (COZAAR) 100 MG tablet Take 100 mg by mouth daily. 02/19/14  Yes [provider]  omeprazole (PRILOSEC) 40  MG capsule Take 1 capsule (40 mg total) by mouth daily. 09/19/16  Yes BranchAlphonse Guild, MD  Potassium 99 MG TABS Take 95 mg by mouth.    Yes [provider]  Probiotic Product (PROBIOTIC DAILY PO) Take 1 capsule by mouth daily.   Yes [provider]  tamsulosin (FLOMAX) 0.4 MG CAPS capsule Take 0.4 mg by mouth daily.  03/12/16  Yes [provider]  verapamil (CALAN-SR) 180 MG CR tablet Take 180 mg by mouth every morning.    Yes [provider]    Family History Family History  Problem Relation Age of Onset  . Stroke Mother   . Heart attack Father   . Colon  cancer Neg Hx     Social History Social History   Tobacco Use  . Smoking status: Never Smoker  . Smokeless tobacco: Never Used  Substance Use Topics  . Alcohol use: Yes    Alcohol/week: 3.0 oz    Types: 5 Cans of beer per week    Comment: occ  . Drug use: No     Allergies   Patient has no known allergies.   Review of Systems Review of Systems  All other systems reviewed and are negative.    Physical Exam Updated Vital Signs BP 131/89   Pulse 61   Temp 97.6 F (36.4 C) (Oral)   Resp 17   Ht 5\' 6"  (1.676 m)   Wt 86.6 kg (191 lb)   SpO2 98%   BMI 30.83 kg/m   Physical Exam  Constitutional: He appears well-developed and well-nourished. No distress.  HENT:  Head: Normocephalic and atraumatic.  Mouth/Throat: Oropharynx is clear and moist. No oropharyngeal exudate.  Eyes: Conjunctivae and EOM are normal. Pupils are equal, round, and reactive to light. Right eye exhibits no discharge. Left eye exhibits no discharge. No scleral icterus.  Neck: Normal range of motion. Neck supple. No JVD present. No thyromegaly present.  Cardiovascular: Normal rate, regular rhythm, normal heart sounds and intact distal pulses. Exam reveals no gallop and no friction rub.  No murmur heard. Pulmonary/Chest: Effort normal and breath sounds normal. No respiratory distress. He has no wheezes. He has no rales.  Abdominal: Soft. Bowel sounds are normal. He exhibits no distension and no mass. There is no tenderness.  Musculoskeletal: Normal range of motion. He exhibits no edema or tenderness.  Lymphadenopathy:    He has no cervical adenopathy.  Neurological: He is alert. Coordination normal.  Skin: Skin is warm and dry. No rash noted. No erythema.  Psychiatric: He has a normal mood and affect. His behavior is normal.  Nursing note and vitals reviewed.    ED Treatments / Results  Labs (all labs ordered are listed, but only abnormal results are displayed) Labs Reviewed  BASIC METABOLIC  PANEL - Abnormal; Notable for the following components:      Result Value   BUN 21 (*)    All other components within normal limits  CBC  TROPONIN I  BASIC METABOLIC PANEL    EKG  EKG Interpretation  Date/Time:  Thursday April 02 2017 17:24:07 EST Ventricular Rate:  75 PR Interval:  162 QRS Duration: 72 QT Interval:  406 QTC Calculation: 453 R Axis:   6 Text Interpretation:  Normal sinus rhythm Low voltage QRS Borderline ECG Poor R wave progression Abnormal ekg since last tracing no significant change Confirmed by Noemi Chapel [phone]) on 04/02/2017 5:31:30 PM       Radiology Dg Chest 2 View  Result Date: 04/02/2017 CLINICAL DATA:  66 year old male with chest pain. EXAM: CHEST  2 VIEW COMPARISON:  Chest radiograph dated 08/19/2015 FINDINGS: The lungs are clear. There is no pleural effusion or pneumothorax. The cardiac silhouette is within normal limits. No acute osseous pathology. IMPRESSION: No active cardiopulmonary disease. Electronically Signed   By: Anner Crete M.D.   On: 04/02/2017 18:09    Procedures Procedures (including critical care time)  Medications Ordered in ED Medications  aspirin EC tablet 81 mg (not administered)  atorvastatin (LIPITOR) tablet 10 mg (not administered)  fluticasone (FLONASE) 50 MCG/ACT nasal spray 1-2 spray (not administered)  hydrochlorothiazide (MICROZIDE) capsule 12.5 mg (not administered)  isosorbide mononitrate (IMDUR) 24 hr tablet 30 mg (not administered)  losartan (COZAAR) tablet 100 mg (not administered)  pantoprazole (PROTONIX) EC tablet 40 mg (not administered)  Potassium TABS 95 mg (not administered)  tamsulosin (FLOMAX) capsule 0.4 mg (not administered)  verapamil (CALAN-SR) CR tablet 180 mg (not administered)  nitroGLYCERIN (NITROSTAT) SL tablet 0.4 mg (not administered)  acetaminophen (TYLENOL) tablet 650 mg (not administered)  ondansetron (ZOFRAN) injection 4 mg (not administered)  enoxaparin (LOVENOX) injection  40 mg (not administered)  ALPRAZolam (XANAX) tablet 0.25 mg (not administered)  zolpidem (AMBIEN) tablet 5 mg (not administered)  morphine 2 MG/ML injection 1-3 mg (not administered)  aspirin chewable tablet 324 mg (324 mg Oral Given 04/02/17 1750)     Initial Impression / Assessment and Plan / ED Course  I have reviewed the triage vital signs and the nursing notes.  Pertinent labs & imaging results that were available during my care of the patient were reviewed by me and considered in my medical decision making (see chart for details).  Clinical Course as of Apr 02 1948  Thu Apr 02, 2017  1822 Not having ongoing pain Trop negative Dr. Harl Bowie is patients Cardiologist Will admit for observation and possible provocative testing.  [BM]    Clinical Course User Index [BM] Noemi Chapel, MD   The patient's exam is unremarkable, his EKG is unchanged and shows no signs of ST elevation or depression.  Given his intermittent but more frequent symptoms he will likely need to have further evaluation with provocative testing.  Anticipate admission to the hospital, troponin pending, aspirin given 325 mg  D/w Admitting hospitalist Will admit for observation Neg trop.  Final Clinical Impressions(s) / ED Diagnoses   Final diagnoses:  None    ED Discharge Orders    None       Noemi Chapel, MD 04/02/17 1949

## 2017-04-02 NOTE — ED Triage Notes (Signed)
Patient reports of intermittent chest pain x5 days with episodes of dizziness.  Has nitro, did not take any doses.  Seen at Dr. Beverlee Nims office today- sent patient to ED for further work-up.

## 2017-04-02 NOTE — H&P (Signed)
History and Physical    EGBERT SEIDEL EVO:[id number] DOB: [birth year] DOA: 04/02/2017  PCP: [name], MD   Patient coming from: Home  Chief Complaint: Chest discomfort   HPI: Dennis [name] is a 66 y.o. male with medical history significant for hypertension, hyperlipidemia, and coronary artery disease with stents, now presenting to the emergency department at the direction of his PCP for evaluation of chest discomfort.  Patient reports that he has been experiencing a general malaise over the past few days with intermittent discomfort in the chest, neck, back.  No significant dyspnea, nausea, or diaphoresis associated with this.  Denies any cough, fevers, or chills.  Reports that these are similar to the symptoms he was experiencing back in 2009 with required coronary intervention with stents.  ED Course: Upon arrival to the ED, patient is found to be afebrile, saturating well on room air, and with vitals stable.  EKG features normal sinus rhythm and chest x-ray is negative for acute cardiopulmonary disease.  Chemistry panel and CBC are unremarkable and troponin is undetectable.  Treated with 324 mg of aspirin in the ED, remained hemodynamically stable, no apparent respiratory distress, and will be observed on the telemetry unit for ongoing evaluation and management of chest discomfort with known CAD.  Review of Systems:  All other systems reviewed and apart from HPI, are negative.  Past Medical History:  Diagnosis Date  . Coronary artery disease   . GERD (gastroesophageal reflux disease)   . Gout   . Hypercholesteremia   . Hypertension   . Myocardial infarction ([place]) 1998    Past Surgical History:  Procedure Laterality Date  . bilateral eye surgery    . CARDIAC CATHETERIZATION  2009   PCI with DES to LAD,PTCA to diagonal, and DES to RCA LV gram LVEF 60%   . CARDIAC SURGERY    . COLONOSCOPY    . COLONOSCOPY  05/14/2011   Procedure: COLONOSCOPY;  Surgeon: Daneil Dolin, MD;   Location: [facility] ENDO [facility];  Service: Endoscopy;  Laterality: N/A;  8:15 AM     reports that  has never smoked. he has never used smokeless tobacco. He reports that he drinks about 3.0 oz of alcohol per week. He reports that he does not use drugs.  No Known Allergies  Family History  Problem Relation Age of Onset  . Stroke Mother   . Heart attack Father   . Colon cancer Neg Hx      Prior to Admission medications   Medication Sig Start Date End Date Taking? Authorizing Provider  acetaminophen (TYLENOL) 500 MG tablet Take 1,000 mg by mouth every 6 (six) hours as needed for mild pain.   Yes [provider]  aspirin (ASPIRIN EC) 81 MG EC tablet Take 81 mg by mouth every evening.    Yes [provider]  atorvastatin (LIPITOR) 20 MG tablet Take 10 mg by mouth every evening.  04/02/14  Yes [provider]  azelastine (ASTELIN) 0.1 % nasal spray Place 1-2 sprays into both nostrils 2 (two) times daily as needed for rhinitis. Use in each nostril as directed   Yes [provider]  fluticasone (FLONASE) 50 MCG/ACT nasal spray Place 1-2 sprays into both nostrils daily as needed for allergies or rhinitis.   Yes [provider]  hydrochlorothiazide (MICROZIDE) 12.5 MG capsule Take 1 capsule (12.5 mg total) by mouth daily as needed. 10/20/16 04/02/17 Yes Lendon Colonel, NP  isosorbide mononitrate (IMDUR) 30 MG 24 hr tablet TAKE  ONE TABLET BY MOUTH ONCE DAILY 10/06/16  Yes Branch, [name], MD  losartan (COZAAR) 100 MG tablet Take 100 mg by mouth daily. 02/19/14  Yes [provider]  omeprazole (PRILOSEC) 40 MG capsule Take 1 capsule (40 mg total) by mouth daily. 09/19/16  Yes BranchAlphonse Guild, MD  Potassium 99 MG TABS Take 95 mg by mouth.    Yes [provider]  Probiotic Product (PROBIOTIC DAILY PO) Take 1 capsule by mouth daily.   Yes [provider]  tamsulosin (FLOMAX) 0.4 MG CAPS capsule Take 0.4 mg by mouth daily.  03/12/16   Yes [provider]  verapamil (CALAN-SR) 180 MG CR tablet Take 180 mg by mouth every morning.    Yes [provider]    Physical Exam: Vitals:   04/02/17 1722 04/02/17 1723  BP: (!) 153/77   Pulse: 71   Resp: 18   Temp: 97.6 F (36.4 C)   TempSrc: Oral   SpO2: 96%   Weight:  86.6 kg (191 lb)  Height:  5\' 6"  (1.676 m)      Constitutional: NAD, calm  Eyes: PERTLA, lids and conjunctivae normal ENMT: Mucous membranes are moist. Posterior pharynx clear of any exudate or lesions.   Neck: normal, supple, no masses, no thyromegaly Respiratory: clear to auscultation bilaterally, no wheezing, no crackles. Normal respiratory effort.   Cardiovascular: S1 & S2 heard, regular rate and rhythm. No significant JVD. Abdomen: No distension, no tenderness, no masses palpated. Bowel sounds normal.  Musculoskeletal: no clubbing / cyanosis. No joint deformity upper and lower extremities.   Skin: no significant rashes, lesions, ulcers. Warm, dry, well-perfused. Neurologic: CN 2-12 grossly intact. Sensation intact. Strength 5/5 in all 4 limbs.  Psychiatric: Alert and oriented x 3. Calm, cooperative.     Labs on Admission: I have personally reviewed following labs and imaging studies  CBC: Recent Labs  Lab 04/02/17 1727  WBC 8.8  HGB 15.6  HCT 44.7  MCV 92.4  PLT 818   Basic Metabolic Panel: Recent Labs  Lab 04/02/17 1727  NA 140  K 3.5  CL 103  CO2 24  GLUCOSE 96  BUN 21*  CREATININE 0.99  CALCIUM 9.6   GFR: Estimated Creatinine Clearance: 74.7 mL/min (by C-G formula based on SCr of 0.99 mg/dL). Liver Function Tests: No results for input(s): AST, ALT, ALKPHOS, BILITOT, PROT, ALBUMIN in the last 168 hours. No results for input(s): LIPASE, AMYLASE in the last 168 hours. No results for input(s): AMMONIA in the last 168 hours. Coagulation Profile: No results for input(s): INR, PROTIME in the last 168 hours. Cardiac Enzymes: Recent Labs  Lab 04/02/17 1727    TROPONINI <0.03   BNP (last 3 results) No results for input(s): PROBNP in the last 8760 hours. HbA1C: No results for input(s): HGBA1C in the last 72 hours. CBG: No results for input(s): GLUCAP in the last 168 hours. Lipid Profile: No results for input(s): CHOL, HDL, LDLCALC, TRIG, CHOLHDL, LDLDIRECT in the last 72 hours. Thyroid Function Tests: No results for input(s): TSH, T4TOTAL, FREET4, T3FREE, THYROIDAB in the last 72 hours. Anemia Panel: No results for input(s): VITAMINB12, FOLATE, FERRITIN, TIBC, IRON, RETICCTPCT in the last 72 hours. Urine analysis:    Component Value Date/Time   COLORURINE YELLOW 05/25/2016 [place] 05/25/2016 1749   LABSPEC 1.024 05/25/2016 1749   PHURINE 5.0 05/25/2016 1749   GLUCOSEU NEGATIVE 05/25/2016 1749   GLUCOSEU neg 03/08/2010 1110   HGBUR SMALL (A) 05/25/2016 1749  BILIRUBINUR NEGATIVE 05/25/2016 [place] 05/25/2016 1749   PROTEINUR NEGATIVE 05/25/2016 1749   UROBILINOGEN 0.2 12/31/2008 1727   NITRITE NEGATIVE 05/25/2016 1749   LEUKOCYTESUR SMALL (A) 05/25/2016 1749   Sepsis Labs: @LABRCNTIP (procalcitonin:4,lacticidven:4) )No results found for this or any previous visit (from the past 240 hour(s)).   Radiological Exams on Admission: Dg Chest 2 View  Result Date: 04/02/2017 CLINICAL DATA:  66 year old male with chest pain. EXAM: CHEST  2 VIEW COMPARISON:  Chest radiograph dated 08/19/2015 FINDINGS: The lungs are clear. There is no pleural effusion or pneumothorax. The cardiac silhouette is within normal limits. No acute osseous pathology. IMPRESSION: No active cardiopulmonary disease. Electronically Signed   By: Anner Crete M.D.   On: 04/02/2017 18:09    EKG: Independently reviewed. Normal sinus rhythm.   Assessment/Plan  1. Chest discomfort; CAD  - Presents with atypical chest discomfort, but similar to sxs that led to PCI in 2009  - Initial EKG and troponin are reassuring, CXR unremarkable   - Continue cardiac monitoring, obtain serial troponin measurements, repeat EKG, continue ASA, Lipitor, nitrates, ARB    2. Hypertension  - BP at goal  - Continue HCTZ, losartan, verapamil     DVT prophylaxis: Lovenox  Code Status: Full  Family Communication: Wife updated at bedside Disposition Plan: Observe on telemetry Consults called: None Admission status: Observation    Vianne Bulls, MD [facility] Pager [phone]  If 7PM-7AM, please contact night-coverage [email or web address] Password [facility]  04/02/2017, 7:48 PM

## 2017-04-03 DIAGNOSIS — I10 Essential (primary) hypertension: Secondary | ICD-10-CM

## 2017-04-03 DIAGNOSIS — R0789 Other chest pain: Secondary | ICD-10-CM

## 2017-04-03 DIAGNOSIS — I2510 Atherosclerotic heart disease of native coronary artery without angina pectoris: Secondary | ICD-10-CM

## 2017-04-03 LAB — BASIC METABOLIC PANEL
Anion gap: 10 (ref 5–15)
BUN: 18 mg/dL (ref 6–20)
CALCIUM: 8.9 mg/dL (ref 8.9–10.3)
CO2: 24 mmol/L (ref 22–32)
CREATININE: 0.8 mg/dL (ref 0.61–1.24)
Chloride: 105 mmol/L (ref 101–111)
GFR calc non Af Amer: >60 mL/min (ref >60)
Glucose, Bld: 95 mg/dL (ref 65–99)
Potassium: 3.4 mmol/L — ABNORMAL LOW (ref 3.5–5.1)
Sodium: 139 mmol/L (ref 135–145)

## 2017-04-03 LAB — TROPONIN I: Troponin I: <0.03 ng/mL (ref <0.03)

## 2017-04-03 MED ORDER — ISOSORBIDE MONONITRATE ER 30 MG PO TB24: 60.0000 mg | ORAL_TABLET | Freq: Every day | ORAL | 4 refills | Status: DC

## 2017-04-03 NOTE — Progress Notes (Signed)
Patient states understanding of discharge instructions.  

## 2017-04-03 NOTE — Discharge Summary (Signed)
Physician Discharge Summary  Dennis [name] HLK:[id number] DOB: 12-12-1950 DOA: 04/02/2017  PCP: [name], MD  Admit date: 04/02/2017 Discharge date: 04/03/2017  Admitted From: Home Disposition: Home  Recommendations for Outpatient Follow-up:  1. Follow up with PCP in 1-2 weeks 2. Please obtain BMP/CBC in one week 3. Follow-up scheduled with cardiology on 1/3  Home Health: Equipment/Devices:  Discharge Condition: Stable CODE STATUS: Full Diet recommendation: Heart Healthy  Brief/Interim Summary: 66 year old male with a history of coronary artery disease, hypertension, presented to the hospital with complaints of atypical chest discomfort.  Patient has a history of coronary artery disease and had PCI in 2009.  Initial EKG on admission was reassuring and did not show any acute changes.  He ruled out for ACS with negative cardiac markers.  Chest x-ray was unremarkable.  Patient did not have any recurrence of symptoms since coming to the hospital.  He is able to ambulate without difficulty.  He does not have any chest pain or shortness of breath.  Inpatient cardiology consultation was offered to the patient, but he would rather undergo any further stress testing as an outpatient.  He is been scheduled to follow-up with his cardiologist on 1/3.  In the interim, his Imdur will be increased from 30 mg to 60 mg daily.  He has been advised to return to hospital if he has any recurrence of symptoms.  Discharge Diagnoses:  Principal Problem:   Chest pain Active Problems:   Hypertension    Discharge Instructions  Discharge Instructions    Diet - low sodium heart healthy   Complete by:  As directed    Increase activity slowly   Complete by:  As directed      Allergies as of 04/03/2017   No Known Allergies     Medication List    TAKE these medications   acetaminophen 500 MG tablet Commonly known as:  TYLENOL Take 1,000 mg by mouth every 6 (six) hours as needed for mild  pain.   aspirin EC 81 MG EC tablet Generic drug:  aspirin Take 81 mg by mouth every evening.   atorvastatin 20 MG tablet Commonly known as:  LIPITOR Take 10 mg by mouth every evening.   azelastine 0.1 % nasal spray Commonly known as:  ASTELIN Place 1-2 sprays into both nostrils 2 (two) times daily as needed for rhinitis. Use in each nostril as directed   fluticasone 50 MCG/ACT nasal spray Commonly known as:  FLONASE Place 1-2 sprays into both nostrils daily as needed for allergies or rhinitis.   hydrochlorothiazide 12.5 MG capsule Commonly known as:  MICROZIDE Take 1 capsule (12.5 mg total) by mouth daily as needed.   isosorbide mononitrate 30 MG 24 hr tablet Commonly known as:  IMDUR Take 2 tablets (60 mg total) by mouth daily. What changed:  how much to take   losartan 100 MG tablet Commonly known as:  COZAAR Take 100 mg by mouth daily.   omeprazole 40 MG capsule Commonly known as:  PRILOSEC Take 1 capsule (40 mg total) by mouth daily.   Potassium 99 MG Tabs Take 95 mg by mouth.   PROBIOTIC DAILY PO Take 1 capsule by mouth daily.   tamsulosin 0.4 MG Caps capsule Commonly known as:  FLOMAX Take 0.4 mg by mouth daily.   verapamil 180 MG CR tablet Commonly known as:  CALAN-SR Take 180 mg by mouth every morning.      Follow-up Information    Branch, [name], MD Follow up  on 04/09/2017.   Specialty:  Cardiology Why:  2:00pm Contact information: [address] [place] [address] [phone]          No Known Allergies  Consultations:     Procedures/Studies: Dg Chest 2 View  Result Date: 04/02/2017 CLINICAL DATA:  66 year old male with chest pain. EXAM: CHEST  2 VIEW COMPARISON:  Chest radiograph dated 08/19/2015 FINDINGS: The lungs are clear. There is no pleural effusion or pneumothorax. The cardiac silhouette is within normal limits. No acute osseous pathology. IMPRESSION: No active cardiopulmonary disease. Electronically Signed   By:  Anner Crete M.D.   On: 04/02/2017 18:09       Subjective: No chest pain, shortness of breath.  Able to ambulate without difficulty.  Discharge Exam: Vitals:   04/03/17 0629 04/03/17 0823  BP: (!) 97/57 116/76  Pulse: 66 65  Resp: 16   Temp: 97.9 F (36.6 C)   SpO2: 97% 96%   Vitals:   04/02/17 2000 04/02/17 2237 04/03/17 0629 04/03/17 0823  BP: (!) 146/86 108/61 (!) 97/57 116/76  Pulse: (!) 59 74 66 65  Resp: 15 16 16    Temp:  97.8 F (36.6 C) 97.9 F (36.6 C)   TempSrc:   Oral   SpO2: 99% 98% 97% 96%  Weight:      Height:        General: Pt is alert, awake, not in acute distress Cardiovascular: RRR, S1/S2 +, no rubs, no gallops Respiratory: CTA bilaterally, no wheezing, no rhonchi Abdominal: Soft, NT, ND, bowel sounds + Extremities: no edema, no cyanosis    The results of significant diagnostics from this hospitalization (including imaging, microbiology, ancillary and laboratory) are listed below for reference.     Microbiology: No results found for this or any previous visit (from the past 240 hour(s)).   Labs: BNP (last 3 results) No results for input(s): BNP in the last 8760 hours. Basic Metabolic Panel: Recent Labs  Lab 04/02/17 1727 04/03/17 0418  NA 140 139  K 3.5 3.4*  CL 103 105  CO2 24 24  GLUCOSE 96 95  BUN 21* 18  CREATININE 0.99 0.80  CALCIUM 9.6 8.9   Liver Function Tests: No results for input(s): AST, ALT, ALKPHOS, BILITOT, PROT, ALBUMIN in the last 168 hours. No results for input(s): LIPASE, AMYLASE in the last 168 hours. No results for input(s): AMMONIA in the last 168 hours. CBC: Recent Labs  Lab 04/02/17 1727  WBC 8.8  HGB 15.6  HCT 44.7  MCV 92.4  PLT 156   Cardiac Enzymes: Recent Labs  Lab 04/02/17 1727 04/03/17 1120  TROPONINI <0.03 <0.03   BNP: Invalid input(s): POCBNP CBG: No results for input(s): GLUCAP in the last 168 hours. D-Dimer No results for input(s): DDIMER in the last 72 hours. Hgb  A1c No results for input(s): HGBA1C in the last 72 hours. Lipid Profile No results for input(s): CHOL, HDL, LDLCALC, TRIG, CHOLHDL, LDLDIRECT in the last 72 hours. Thyroid function studies No results for input(s): TSH, T4TOTAL, T3FREE, THYROIDAB in the last 72 hours.  Invalid input(s): FREET3 Anemia work up No results for input(s): VITAMINB12, FOLATE, FERRITIN, TIBC, IRON, RETICCTPCT in the last 72 hours. Urinalysis    Component Value Date/Time   COLORURINE YELLOW 05/25/2016 [place] 05/25/2016 1749   LABSPEC 1.024 05/25/2016 1749   PHURINE 5.0 05/25/2016 1749   GLUCOSEU NEGATIVE 05/25/2016 1749   GLUCOSEU neg 03/08/2010 1110   HGBUR SMALL (A) 05/25/2016 1749   BILIRUBINUR  NEGATIVE 05/25/2016 [place] 05/25/2016 1749   PROTEINUR NEGATIVE 05/25/2016 1749   UROBILINOGEN 0.2 12/31/2008 1727   NITRITE NEGATIVE 05/25/2016 1749   LEUKOCYTESUR SMALL (A) 05/25/2016 1749   Sepsis Labs Invalid input(s): PROCALCITONIN,  WBC,  LACTICIDVEN Microbiology No results found for this or any previous visit (from the past 240 hour(s)).   Time coordinating discharge: Over 30 minutes  SIGNED:   Kathie Dike, MD  [facility] 04/03/2017, 1:24 PM Pager   If 7PM-7AM, please contact night-coverage [email or web address] Password [email or web address]

## 2017-04-09 ENCOUNTER — Encounter: Payer: Self-pay | Admitting: Cardiology

## 2017-04-09 ENCOUNTER — Ambulatory Visit: Payer: Medicare HMO | Admitting: Cardiology

## 2017-04-09 VITALS — HR 85 | Ht 66.0 in | Wt 194.0 lb

## 2017-04-09 DIAGNOSIS — I10 Essential (primary) hypertension: Secondary | ICD-10-CM | POA: Diagnosis not present

## 2017-04-09 DIAGNOSIS — R0789 Other chest pain: Secondary | ICD-10-CM

## 2017-04-09 DIAGNOSIS — I2510 Atherosclerotic heart disease of native coronary artery without angina pectoris: Secondary | ICD-10-CM | POA: Diagnosis not present

## 2017-04-09 DIAGNOSIS — E782 Mixed hyperlipidemia: Secondary | ICD-10-CM

## 2017-04-09 MED ORDER — ISOSORBIDE MONONITRATE ER 60 MG PO TB24: 60.0000 mg | ORAL_TABLET | Freq: Every day | ORAL | 3 refills | Status: DC

## 2017-04-09 MED ORDER — ESOMEPRAZOLE MAGNESIUM 40 MG PO PACK: 40.0000 mg | PACK | Freq: Every day | ORAL | 3 refills | Status: DC

## 2017-04-09 NOTE — Progress Notes (Signed)
Clinical Summary Mr. Victorian is a 67 y.o.male seen today for follow up of the following medical problems.    1. CAD  - cath 06/2007 in the setting of unstable angina, PCI with DES to LAD,PTCA to diagonal, and DES to RCA LV gram LVEF 60%  - 01/2013 Stress MPI low risk study, no ischemia - 01/2013 echo LVEF 60-65%, grade II diastolic dysfunction - 0/1027 exercise nuclear stress test: no ischemia   - chest pains week prior to admissoin. Pain across upper chest into shoulders into midback. Pressure like pain, could occur at rest or with activity. No other associated symptoms. 4/10 in severity. Would last few minutes at at time. Not positional. - seen by pcp, sent to ER. - admit 03/2017 with chest pain. Negative workup for ACS, imdur was increased to 60mg  daily and discharged. Patient did not make change  - mild symptoms this week - no recent SOB/DOE.  - similar to prior pains.   2. Dizziness  - unsure of duration, off and on - does not happen with laying. Can happen with turning with head. Can have some occasoinal orthostatic symptoms. - lightheaded for just a few seconds. Occurs sporadically.   - water glasses x 1.5 glasses, 1 cup decaf coffee, occasonal tea, occasional soda, occasional beer  3. HTN - compliant with meds - bp's at home typically 110s/70s  4. Hyperlipidemia -  prior mylagias on lipitor 20 mg daily, he had done well on  lipitor 10mg  daily.  - remain compliant with statin    SH: often travels to [place]. Recent traveled to [place]. Enjoys classic cars. He was good friends with a patient of mine who recently passed away [name]. He still remains in touch with his wife [name] who is also a patient of mine.    Past Medical History:  Diagnosis Date  . Coronary artery disease   . GERD (gastroesophageal reflux disease)   . Gout   . Hypercholesteremia   . Hypertension   . Myocardial infarction ([place]) 1998     No Known  Allergies   Current Outpatient Medications  Medication Sig Dispense Refill  . acetaminophen (TYLENOL) 500 MG tablet Take 1,000 mg by mouth every 6 (six) hours as needed for mild pain.    Marland Kitchen aspirin (ASPIRIN EC) 81 MG EC tablet Take 81 mg by mouth every evening.     Marland Kitchen atorvastatin (LIPITOR) 20 MG tablet Take 10 mg by mouth every evening.   1  . azelastine (ASTELIN) 0.1 % nasal spray Place 1-2 sprays into both nostrils 2 (two) times daily as needed for rhinitis. Use in each nostril as directed    . fluticasone (FLONASE) 50 MCG/ACT nasal spray Place 1-2 sprays into both nostrils daily as needed for allergies or rhinitis.    . hydrochlorothiazide (MICROZIDE) 12.5 MG capsule Take 1 capsule (12.5 mg total) by mouth daily as needed. 90 capsule 3  . isosorbide mononitrate (IMDUR) 30 MG 24 hr tablet Take 2 tablets (60 mg total) by mouth daily. 90 tablet 4  . losartan (COZAAR) 100 MG tablet Take 100 mg by mouth daily.  1  . omeprazole (PRILOSEC) 40 MG capsule Take 1 capsule (40 mg total) by mouth daily. 90 capsule 3  . Potassium 99 MG TABS Take 95 mg by mouth.     . Probiotic Product (PROBIOTIC DAILY PO) Take 1 capsule by mouth daily.    . tamsulosin (FLOMAX) 0.4 MG CAPS capsule Take 0.4 mg by  mouth daily.     . verapamil (CALAN-SR) 180 MG CR tablet Take 180 mg by mouth every morning.      No current facility-administered medications for this visit.      Past Surgical History:  Procedure Laterality Date  . bilateral eye surgery    . CARDIAC CATHETERIZATION  2009   PCI with DES to LAD,PTCA to diagonal, and DES to RCA LV gram LVEF 60%   . CARDIAC SURGERY    . COLONOSCOPY    . COLONOSCOPY  05/14/2011   Procedure: COLONOSCOPY;  Surgeon: Daneil Dolin, MD;  Location: [facility];  Service: Endoscopy;  Laterality: N/A;  8:15 AM     No Known Allergies    Family History  Problem Relation Age of Onset  . Stroke Mother   . Heart attack Father   . Colon cancer Neg Hx      Social  History Mr. Hritz reports that  has never smoked. he has never used smokeless tobacco. Mr. Braithwaite reports that he drinks about 3.0 oz of alcohol per week.   Review of Systems CONSTITUTIONAL: No weight loss, fever, chills, weakness or fatigue.  HEENT: Eyes: No visual loss, blurred vision, double vision or yellow sclerae.No hearing loss, sneezing, congestion, runny nose or sore throat.  SKIN: No rash or itching.  CARDIOVASCULAR: per hpi RESPIRATORY: per hpi GASTROINTESTINAL: No anorexia, nausea, vomiting or diarrhea. No abdominal pain or blood.  GENITOURINARY: No burning on urination, no polyuria NEUROLOGICAL: No headache, dizziness, syncope, paralysis, ataxia, numbness or tingling in the extremities. No change in bowel or bladder control.  MUSCULOSKELETAL: No muscle, back pain, joint pain or stiffness.  LYMPHATICS: No enlarged nodes. No history of splenectomy.  PSYCHIATRIC: No history of depression or anxiety.  ENDOCRINOLOGIC: No reports of sweating, cold or heat intolerance. No polyuria or polydipsia.  Marland Kitchen   Physical Examination Vitals:   04/09/17 1350  Pulse: 85  SpO2: 98%   Filed Weights   04/09/17 1350  Weight: 194 lb (88 kg)    Gen: resting comfortably, no acute distress HEENT: no scleral icterus, pupils equal round and reactive, no palptable cervical adenopathy,  CV: RRR, no m/r/g, no jvd Resp: Clear to auscultation bilaterally GI: abdomen is soft, non-tender, non-distended, normal bowel sounds, no hepatosplenomegaly MSK: extremities are warm, no edema.  Skin: warm, no rash Neuro:  no focal deficits Psych: appropriate affect   Diagnostic Studies  01/2013 Stress MPI Analysis of the raw perfusion data finds radiotracer uptake in the gut adjacent to the inferior wall.  Tomographic views were obtained using the short axis, vertical long axis, and horizontal long axis planes. There is a small, mild to moderate intensity, inferior perfusion defect that is fixed.  No significant reversible defects to indicate ischemia.  Gated imaging reveals an EDV of 70, ESV of 24, normal TID ratio of 0.89, and LVEF of 65% without wall motion abnormality.  IMPRESSION: Low risk exercise Cardiolite. No chest pain reported, there were no diagnostic ST segment changes at maximum work load of 10.1 METS. Perfusion imaging is most consistent with inferior wall attenuation, no definite scar or ischemic defects. LV volumes are normal with LVEF 65% and no focal wall motion abnormalities.  01/2013 Echo Study Conclusions  - Left ventricle: The cavity size was normal. There was mild concentric hypertrophy. Systolic function was normal. The estimated ejection fraction was in the range of 60% to 65%. Wall motion was normal; there were no regional wall motion abnormalities. Features are consistent with  a pseudonormal left ventricular filling pattern, with concomitant abnormal relaxation and increased filling pressure (grade 2 diastolic dysfunction). - Mitral valve: Trivial regurgitation. - Left atrium: The atrium was at the upper limits of normal in size. - Tricuspid valve: Physiologic regurgitation. - Pulmonary arteries: Systolic pressure could not be accurately estimated. - Inferior vena cava: Poorly visualized. Unable to estimate CVP. - Pericardium, extracardiac: There was no pericardial effusion. Impressions:  - Comparison to prior study March 2009. Mild LVH with LVEF 18-56%, grade 2 diastolic dysfunction. Upper normal left atrial size. Trivial mitral and tricuspid regurgitation. Unable to assess PASP or CVP.   08/2015 MPI  There was no ST segment deviation noted during stress.  Findings consistent with prior inferior myocardial infarction. There is no current ischemia  This is a low risk study.  The left ventricular ejection fraction is hyperdynamic (>65%).  [facility] treadmill score of 5, consistent with low risk for major  cardiac events     Assessment and Plan  1. CAD - he has had intermittent atypical chest pain on and off over the last several years. Noninvaisve stress testing has been unremarkable, most recently in 08/2015. Continued symptoms at times.  - we will increase imdur to 60mg  daily. Change prilosec to nexium 40mg  daily for possible GI etiology - if symptoms continue or progress would plan for cath. He is to call us in 1 week to update [place] on symptoms.   2. HTN - bp at goal, continue current meds  3. Hyperlipidemia.  Prior myalgias on higher dose statin -continue current meds  4. Dizziness - orthostatic by pulse in clinic,  stop HCTZ and increase hydration and follow symptoms.    F/u 4 months      Arnoldo Lenis, M.D.

## 2017-04-09 NOTE — Patient Instructions (Addendum)
Medication Instructions:  Stop HYDROCHLOROTHIAZIDE  STOP PRILOSEC  START NEXIUM 40 MG DAILY  INCREASE IMDUR TO 60 MG DAILY   Labwork: NONE  Testing/Procedures: NONE  Follow-Up: Your physician wants you to follow-up in: 4 MONTHS .  You will receive a reminder letter in the mail two months in advance. If you don't receive a letter, please call our office to schedule the follow-up appointment.   Any Other Special Instructions Will Be Listed Below (If Applicable).  PLEASE CALL IN 1 WEEK TO UPDATE DR. BRANCH ON YOUR CHEST PAIN.  If you need a refill on your cardiac medications before your next appointment, please call your pharmacy.

## 2017-04-10 ENCOUNTER — Other Ambulatory Visit: Payer: Self-pay

## 2017-04-10 MED ORDER — ESOMEPRAZOLE MAGNESIUM 40 MG PO CPDR: 40.0000 mg | DELAYED_RELEASE_CAPSULE | Freq: Every day | ORAL | 1 refills | Status: DC

## 2017-04-10 NOTE — Telephone Encounter (Signed)
Changed rx for Nexium  from packets to tablets

## 2017-04-15 ENCOUNTER — Encounter: Payer: Self-pay | Admitting: Cardiology

## 2017-04-22 ENCOUNTER — Telehealth: Payer: Self-pay

## 2017-04-22 NOTE — Telephone Encounter (Signed)
Pt called to let Dr. Harl Bowie know that increasing him Imdur has helped his chest pain. He did state that the Nexium did not help any more than the Prilosec did and his insurance doesn't cover the nexium. He asked if he could switch back to the prilosec once he has taken all of these up.

## 2017-04-23 NOTE — Addendum Note (Signed)
Addended by: Debbora Lacrosse R on: 04/23/2017 10:45 AM   Modules accepted: Orders

## 2017-04-23 NOTE — Telephone Encounter (Signed)
Pt made aware he could switch back to prilosec. He was taking 20 mg BID, but states he already has an active prescription.

## 2017-04-23 NOTE — Telephone Encounter (Signed)
Yes, ok to change back to prilosec. Have him notify us if any worsening chest pain symptoms, other wise f/u 2 months. Was he getting prilosec over the counter, if not verify prior dose and can represcribe.    Zandra Abts MD

## 2017-06-23 DIAGNOSIS — Z79899 Other long term (current) drug therapy: Secondary | ICD-10-CM | POA: Diagnosis not present

## 2017-06-23 DIAGNOSIS — E785 Hyperlipidemia, unspecified: Secondary | ICD-10-CM | POA: Diagnosis not present

## 2017-06-23 DIAGNOSIS — I2510 Atherosclerotic heart disease of native coronary artery without angina pectoris: Secondary | ICD-10-CM | POA: Diagnosis not present

## 2017-06-23 DIAGNOSIS — Z125 Encounter for screening for malignant neoplasm of prostate: Secondary | ICD-10-CM | POA: Diagnosis not present

## 2017-06-23 DIAGNOSIS — R7301 Impaired fasting glucose: Secondary | ICD-10-CM | POA: Diagnosis not present

## 2017-06-23 DIAGNOSIS — K219 Gastro-esophageal reflux disease without esophagitis: Secondary | ICD-10-CM | POA: Diagnosis not present

## 2017-06-23 DIAGNOSIS — I10 Essential (primary) hypertension: Secondary | ICD-10-CM | POA: Diagnosis not present

## 2017-06-30 DIAGNOSIS — Z6830 Body mass index (BMI) 30.0-30.9, adult: Secondary | ICD-10-CM | POA: Diagnosis not present

## 2017-06-30 DIAGNOSIS — I2510 Atherosclerotic heart disease of native coronary artery without angina pectoris: Secondary | ICD-10-CM | POA: Diagnosis not present

## 2017-06-30 DIAGNOSIS — I10 Essential (primary) hypertension: Secondary | ICD-10-CM | POA: Diagnosis not present

## 2017-06-30 DIAGNOSIS — Z0001 Encounter for general adult medical examination with abnormal findings: Secondary | ICD-10-CM | POA: Diagnosis not present

## 2017-08-07 ENCOUNTER — Ambulatory Visit: Payer: Medicare HMO | Admitting: Cardiology

## 2017-08-07 ENCOUNTER — Other Ambulatory Visit: Payer: Self-pay | Admitting: Cardiology

## 2017-08-07 ENCOUNTER — Encounter: Payer: Self-pay | Admitting: Cardiology

## 2017-08-07 ENCOUNTER — Other Ambulatory Visit (HOSPITAL_COMMUNITY)
Admission: RE | Admit: 2017-08-07 | Discharge: 2017-08-07 | Disposition: A | Discharge status: Home / Self Care | Payer: Medicare HMO | Source: Ambulatory Visit | Attending: Cardiology | Admitting: Cardiology

## 2017-08-07 VITALS — BP 123/79 | HR 74 | Ht 66.0 in | Wt 190.6 lb

## 2017-08-07 DIAGNOSIS — I25119 Atherosclerotic heart disease of native coronary artery with unspecified angina pectoris: Secondary | ICD-10-CM | POA: Diagnosis not present

## 2017-08-07 DIAGNOSIS — R079 Chest pain, unspecified: Secondary | ICD-10-CM

## 2017-08-07 DIAGNOSIS — E782 Mixed hyperlipidemia: Secondary | ICD-10-CM

## 2017-08-07 DIAGNOSIS — I10 Essential (primary) hypertension: Secondary | ICD-10-CM | POA: Diagnosis not present

## 2017-08-07 DIAGNOSIS — Z01818 Encounter for other preprocedural examination: Secondary | ICD-10-CM

## 2017-08-07 DIAGNOSIS — R0789 Other chest pain: Secondary | ICD-10-CM

## 2017-08-07 LAB — BASIC METABOLIC PANEL
Anion gap: 11 (ref 5–15)
BUN: 13 mg/dL (ref 6–20)
CALCIUM: 9.4 mg/dL (ref 8.9–10.3)
CO2: 22 mmol/L (ref 22–32)
Chloride: 107 mmol/L (ref 101–111)
Creatinine, Ser: 0.81 mg/dL (ref 0.61–1.24)
GFR calc Af Amer: >60 mL/min (ref >60)
GFR calc non Af Amer: >60 mL/min (ref >60)
Glucose, Bld: 109 mg/dL — ABNORMAL HIGH (ref 65–99)
Potassium: 4 mmol/L (ref 3.5–5.1)
Sodium: 140 mmol/L (ref 135–145)

## 2017-08-07 LAB — CBC WITH DIFFERENTIAL/PLATELET
Basophils Absolute: 0 10*3/uL (ref 0.0–0.1)
Basophils Relative: 1 %
Eosinophils Absolute: 0.4 10*3/uL (ref 0.0–0.7)
Eosinophils Relative: 7 %
HEMATOCRIT: 42.3 % (ref 39.0–52.0)
Hemoglobin: 14.8 g/dL (ref 13.0–17.0)
LYMPHS PCT: 34 %
Lymphs Abs: 1.9 10*3/uL (ref 0.7–4.0)
MCH: 32.7 pg (ref 26.0–34.0)
MCHC: 35 g/dL (ref 30.0–36.0)
MCV: 93.4 fL (ref 78.0–100.0)
MONO ABS: 0.4 10*3/uL (ref 0.1–1.0)
MONOS PCT: 8 %
NEUTROS ABS: 2.8 10*3/uL (ref 1.7–7.7)
Neutrophils Relative %: 50 %
Platelets: 143 10*3/uL — ABNORMAL LOW (ref 150–400)
RBC: 4.53 MIL/uL (ref 4.22–5.81)
RDW: 13.2 % (ref 11.5–15.5)
WBC: 5.5 10*3/uL (ref 4.0–10.5)

## 2017-08-07 LAB — PROTIME-INR
INR: 1.08
Prothrombin Time: 13.9 seconds (ref 11.4–15.2)

## 2017-08-07 NOTE — H&P (View-Only) (Signed)
Clinical Summary Dennis [name] is a 67 y.o.male seen today for follow up of the following medical problems.    1. CAD  - cath 06/2007 in the setting of unstable angina, PCI with DES to LAD,PTCA to diagonal, and DES to RCA LV gram LVEF 60%  - 01/2013 Stress MPI low risk study, no ischemia - 01/2013 echo LVEF 60-65%, grade II diastolic dysfunction - 12/2117 exercise nuclear stress test: no ischemia  - he has had interrmittent chest pain for months. We have tried adjusting his antianginal, tried adjusting his PPI - episode of chest pain early April. Similar to prior symptoms. - repeat episodes over the last few days. Increased SOB.    2. Dizziness  - orthostatic last clinic visit. Symptoms have improved off HCTZ and with increaesd hydration.      3. HTN - compliant with meds.   4. Hyperlipidemia -  prior mylagias on lipitor 20 mg daily,he had done well onlipitor 10mg  daily.  - remain compliant with statin    SH: often travels to [place]. Recent traveled to [place]. Enjoys classic cars. He was good friends with a patient of mine who recently passed away [name]. He still remains in touch with his wife [name] who is also a patient of mine.     Past Medical History:  Diagnosis Date  . Coronary artery disease   . GERD (gastroesophageal reflux disease)   . Gout   . Hypercholesteremia   . Hypertension   . Myocardial infarction ([place]) 1998     No Known Allergies   Current Outpatient Medications  Medication Sig Dispense Refill  . acetaminophen (TYLENOL) 500 MG tablet Take 1,000 mg by mouth every 6 (six) hours as needed for mild pain.    Marland Kitchen aspirin (ASPIRIN EC) 81 MG EC tablet Take 81 mg by mouth every evening.     Marland Kitchen atorvastatin (LIPITOR) 20 MG tablet Take 10 mg by mouth every evening.   1  . azelastine (ASTELIN) 0.1 % nasal spray Place 1-2 sprays into both nostrils 2 (two) times daily as needed for rhinitis. Use in each nostril as  directed    . fluticasone (FLONASE) 50 MCG/ACT nasal spray Place 1-2 sprays into both nostrils daily as needed for allergies or rhinitis.    Marland Kitchen isosorbide mononitrate (IMDUR) 60 MG 24 hr tablet Take 1 tablet (60 mg total) by mouth daily. 90 tablet 3  . losartan (COZAAR) 100 MG tablet Take 100 mg by mouth daily.  1  . Probiotic Product (PROBIOTIC DAILY PO) Take 1 capsule by mouth daily.    . tamsulosin (FLOMAX) 0.4 MG CAPS capsule Take 0.4 mg by mouth daily.     . verapamil (CALAN-SR) 180 MG CR tablet Take 180 mg by mouth every morning.      No current facility-administered medications for this visit.      Past Surgical History:  Procedure Laterality Date  . bilateral eye surgery    . CARDIAC CATHETERIZATION  2009   PCI with DES to LAD,PTCA to diagonal, and DES to RCA LV gram LVEF 60%   . CARDIAC SURGERY    . COLONOSCOPY    . COLONOSCOPY  05/14/2011   Procedure: COLONOSCOPY;  Surgeon: Daneil Dolin, MD;  [facility];  Service: Endoscopy;  Laterality: N/A;  8:15 AM     No Known Allergies    Family History  Problem Relation Age of Onset  . Stroke Mother   . Heart attack  Father   . Colon cancer Neg Hx      Social History [name] reports that he has never smoked. He has never used smokeless tobacco. [name] reports that he drinks about 3.0 oz of alcohol per week.   Review of Systems CONSTITUTIONAL: No weight loss, fever, chills, weakness or fatigue.  HEENT: Eyes: No visual loss, blurred vision, double vision or yellow sclerae.No hearing loss, sneezing, congestion, runny nose or sore throat.  SKIN: No rash or itching.  CARDIOVASCULAR: per hpi RESPIRATORY: per hpi GASTROINTESTINAL: No anorexia, nausea, vomiting or diarrhea. No abdominal pain or blood.  GENITOURINARY: No burning on urination, no polyuria NEUROLOGICAL: No headache, dizziness, syncope, paralysis, ataxia, numbness or tingling in the extremities. No change in bowel or bladder control.    MUSCULOSKELETAL: No muscle, back pain, joint pain or stiffness.  LYMPHATICS: No enlarged nodes. No history of splenectomy.  PSYCHIATRIC: No history of depression or anxiety.  ENDOCRINOLOGIC: No reports of sweating, cold or heat intolerance. No polyuria or polydipsia.  Marland Kitchen   Physical Examination Vitals:   08/07/17 0810  BP: 123/79  Pulse: 74  SpO2: 96%   Filed Weights   08/07/17 0810  Weight: 190 lb 9.6 oz (86.5 kg)    Gen: resting comfortably, no acute distress HEENT: no scleral icterus, pupils equal round and reactive, no palptable cervical adenopathy,  CV: RRR, no m/r/g no jvd Resp: CTAB GI: abdomen is soft, non-tender, non-distended, normal bowel sounds, no hepatosplenomegaly MSK: extremities are warm, no edema.  Skin: warm, no rash Neuro:  no focal deficits Psych: appropriate affect   Diagnostic Studies 01/2013 Stress MPI Analysis of the raw perfusion data finds radiotracer uptake in the gut adjacent to the inferior wall.  Tomographic views were obtained using the short axis, vertical long axis, and horizontal long axis planes. There is a small, mild to moderate intensity, inferior perfusion defect that is fixed. No significant reversible defects to indicate ischemia.  Gated imaging reveals an EDV of 70, ESV of 24, normal TID ratio of 0.89, and LVEF of 65% without wall motion abnormality.  IMPRESSION: Low risk exercise Cardiolite. No chest pain reported, there were no diagnostic ST segment changes at maximum work load of 10.1 METS. Perfusion imaging is most consistent with inferior wall attenuation, no definite scar or ischemic defects. LV volumes are normal with LVEF 65% and no focal wall motion abnormalities.  01/2013 Echo Study Conclusions  - Left ventricle: The cavity size was normal. There was mild concentric hypertrophy. Systolic function was normal. The estimated ejection fraction was in the range of 60% to 65%. Wall motion was normal; there  were no regional wall motion abnormalities. Features are consistent with a pseudonormal left ventricular filling pattern, with concomitant abnormal relaxation and increased filling pressure (grade 2 diastolic dysfunction). - Mitral valve: Trivial regurgitation. - Left atrium: The atrium was at the upper limits of normal in size. - Tricuspid valve: Physiologic regurgitation. - Pulmonary arteries: Systolic pressure could not be accurately estimated. - Inferior vena cava: Poorly visualized. Unable to estimate CVP. - Pericardium, extracardiac: There was no pericardial effusion. Impressions:  - Comparison to prior study March 2009. Mild LVH with LVEF 30-09%, grade 2 diastolic dysfunction. Upper normal left atrial size. Trivial mitral and tricuspid regurgitation. Unable to assess PASP or CVP.   08/2015 MPI  There was no ST segment deviation noted during stress.  Findings consistent with prior inferior myocardial infarction. There is no current ischemia  This is a low risk study.  The  left ventricular ejection fraction is hyperdynamic (>65%).  Duke treadmill score of 5, consistent with low risk for major cardiac events      Assessment and Plan  1. CAD -he has had intermittent somewhat atypical chest pain on and off over the last several years. Noninvaisve stress testing has been unremarkable, most recently in 08/2015. - - symptoms have progressed, now with progression DOE - given the ongoing and progressing nature of his symptoms that has not responded to increase in antianginal therapy or PPI, we will plan for left heart cath  2. HTN - at goal, conitnue current meds   3. Hyperlipideima - continue staitn    I have reviewed the risks, indications, and alternatives to cardiac catheterization, possible angioplasty, and stenting with the patient and his wife today. Risks include but are not limited to bleeding, infection, vascular injury, stroke,  myocardial infection, arrhythmia, kidney injury, radiation-related injury in the case of prolonged fluoroscopy use, emergency cardiac surgery, and death. The patient understands the risks of serious complication is 1-2 in 4580 with diagnostic cardiac cath and 1-2% or less with angioplasty/stenting.     Arnoldo Lenis, M.D.

## 2017-08-07 NOTE — Patient Instructions (Addendum)
    [place] [address] Dept: [phone] Loc: [phone]  Dennis [name]  08/07/2017  You are scheduled for a Cardiac Catheterization on Friday, May 10 with Dr. Peter Martinique.  1. Please arrive at the [facility] (Main Entrance A) at [facility]: [place],  [address] at 7:00 AM (two hours before your procedure to ensure your preparation). Free valet parking service is available.   Special note: Every effort is made to have your procedure done on time. Please understand that emergencies sometimes delay scheduled procedures.  2. Diet: Do not eat or drink anything after midnight prior to your procedure except sips of water to take medications.  3. Labs: Get lab work today at [facility]  4. Medication instructions in preparation for your procedure:  Take medications as usual  On the morning of your procedure, take your Aspirin 81 mg and any morning medicines NOT listed above.  You may use sips of water.  5. Plan for one night stay--bring personal belongings. 6. Bring a current list of your medications and current insurance cards. 7. You MUST have a responsible person to drive you home. 8. Someone MUST be with you the first 24 hours after you arrive home or your discharge will be delayed. 9. Please wear clothes that are easy to get on and off and wear slip-on shoes.  Thank you for allowing [place] to care for you!   -- [facility]

## 2017-08-07 NOTE — Progress Notes (Signed)
Clinical Summary Dennis [name] is a 66 y.o.male seen today for follow up of the following medical problems.    1. CAD  - cath 06/2007 in the setting of unstable angina, PCI with DES to LAD,PTCA to diagonal, and DES to RCA LV gram LVEF 60%  - 01/2013 Stress MPI low risk study, no ischemia - 01/2013 echo LVEF 60-65%, grade II diastolic dysfunction - 10/6281 exercise nuclear stress test: no ischemia  - he has had interrmittent chest pain for months. We have tried adjusting his antianginal, tried adjusting his PPI - episode of chest pain early April. Similar to prior symptoms. - repeat episodes over the last few days. Increased SOB.    2. Dizziness  - orthostatic last clinic visit. Symptoms have improved off HCTZ and with increaesd hydration.      3. HTN - compliant with meds.   4. Hyperlipidemia -  prior mylagias on lipitor 20 mg daily,he had done well onlipitor 10mg  daily.  - remain compliant with statin    SH: often travels to [place]. Recent traveled to [place]. Enjoys classic cars. He was good friends with a patient of mine who recently passed away Dennis [name]. He still remains in touch with his wife Dennis [name] who is also a patient of mine.     Past Medical History:  Diagnosis Date  . Coronary artery disease   . GERD (gastroesophageal reflux disease)   . Gout   . Hypercholesteremia   . Hypertension   . Myocardial infarction ([place]) 1998     No Known Allergies   Current Outpatient Medications  Medication Sig Dispense Refill  . acetaminophen (TYLENOL) 500 MG tablet Take 1,000 mg by mouth every 6 (six) hours as needed for mild pain.    Marland Kitchen aspirin (ASPIRIN EC) 81 MG EC tablet Take 81 mg by mouth every evening.     Marland Kitchen atorvastatin (LIPITOR) 20 MG tablet Take 10 mg by mouth every evening.   1  . azelastine (ASTELIN) 0.1 % nasal spray Place 1-2 sprays into both nostrils 2 (two) times daily as needed for rhinitis. Use in each nostril as  directed    . fluticasone (FLONASE) 50 MCG/ACT nasal spray Place 1-2 sprays into both nostrils daily as needed for allergies or rhinitis.    Marland Kitchen isosorbide mononitrate (IMDUR) 60 MG 24 hr tablet Take 1 tablet (60 mg total) by mouth daily. 90 tablet 3  . losartan (COZAAR) 100 MG tablet Take 100 mg by mouth daily.  1  . Probiotic Product (PROBIOTIC DAILY PO) Take 1 capsule by mouth daily.    . tamsulosin (FLOMAX) 0.4 MG CAPS capsule Take 0.4 mg by mouth daily.     . verapamil (CALAN-SR) 180 MG CR tablet Take 180 mg by mouth every morning.      No current facility-administered medications for this visit.      Past Surgical History:  Procedure Laterality Date  . bilateral eye surgery    . CARDIAC CATHETERIZATION  2009   PCI with DES to LAD,PTCA to diagonal, and DES to RCA LV gram LVEF 60%   . CARDIAC SURGERY    . COLONOSCOPY    . COLONOSCOPY  05/14/2011   Procedure: COLONOSCOPY;  Surgeon: Daneil Dolin, MD;  [facility];  Service: Endoscopy;  Laterality: N/A;  8:15 AM     No Known Allergies    Family History  Problem Relation Age of Onset  . Stroke Mother   . Heart attack  Father   . Colon cancer Neg Hx      Social History Dennis [name] reports that he has never smoked. He has never used smokeless tobacco. Dennis [name] reports that he drinks about 3.0 oz of alcohol per week.   Review of Systems CONSTITUTIONAL: No weight loss, fever, chills, weakness or fatigue.  HEENT: Eyes: No visual loss, blurred vision, double vision or yellow sclerae.No hearing loss, sneezing, congestion, runny nose or sore throat.  SKIN: No rash or itching.  CARDIOVASCULAR: per hpi RESPIRATORY: per hpi GASTROINTESTINAL: No anorexia, nausea, vomiting or diarrhea. No abdominal pain or blood.  GENITOURINARY: No burning on urination, no polyuria NEUROLOGICAL: No headache, dizziness, syncope, paralysis, ataxia, numbness or tingling in the extremities. No change in bowel or bladder control.    MUSCULOSKELETAL: No muscle, back pain, joint pain or stiffness.  LYMPHATICS: No enlarged nodes. No history of splenectomy.  PSYCHIATRIC: No history of depression or anxiety.  ENDOCRINOLOGIC: No reports of sweating, cold or heat intolerance. No polyuria or polydipsia.  Marland Kitchen   Physical Examination Vitals:   08/07/17 0810  BP: 123/79  Pulse: 74  SpO2: 96%   Filed Weights   08/07/17 0810  Weight: 190 lb 9.6 oz (86.5 kg)    Gen: resting comfortably, no acute distress HEENT: no scleral icterus, pupils equal round and reactive, no palptable cervical adenopathy,  CV: RRR, no m/r/g no jvd Resp: CTAB GI: abdomen is soft, non-tender, non-distended, normal bowel sounds, no hepatosplenomegaly MSK: extremities are warm, no edema.  Skin: warm, no rash Neuro:  no focal deficits Psych: appropriate affect   Diagnostic Studies 01/2013 Stress MPI Analysis of the raw perfusion data finds radiotracer uptake in the gut adjacent to the inferior wall.  Tomographic views were obtained using the short axis, vertical long axis, and horizontal long axis planes. There is a small, mild to moderate intensity, inferior perfusion defect that is fixed. No significant reversible defects to indicate ischemia.  Gated imaging reveals an EDV of 70, ESV of 24, normal TID ratio of 0.89, and LVEF of 65% without wall motion abnormality.  IMPRESSION: Low risk exercise Cardiolite. No chest pain reported, there were no diagnostic ST segment changes at maximum work load of 10.1 METS. Perfusion imaging is most consistent with inferior wall attenuation, no definite scar or ischemic defects. LV volumes are normal with LVEF 65% and no focal wall motion abnormalities.  01/2013 Echo Study Conclusions  - Left ventricle: The cavity size was normal. There was mild concentric hypertrophy. Systolic function was normal. The estimated ejection fraction was in the range of 60% to 65%. Wall motion was normal; there  were no regional wall motion abnormalities. Features are consistent with a pseudonormal left ventricular filling pattern, with concomitant abnormal relaxation and increased filling pressure (grade 2 diastolic dysfunction). - Mitral valve: Trivial regurgitation. - Left atrium: The atrium was at the upper limits of normal in size. - Tricuspid valve: Physiologic regurgitation. - Pulmonary arteries: Systolic pressure could not be accurately estimated. - Inferior vena cava: Poorly visualized. Unable to estimate CVP. - Pericardium, extracardiac: There was no pericardial effusion. Impressions:  - Comparison to prior study March 2009. Mild LVH with LVEF 32-20%, grade 2 diastolic dysfunction. Upper normal left atrial size. Trivial mitral and tricuspid regurgitation. Unable to assess PASP or CVP.   08/2015 MPI  There was no ST segment deviation noted during stress.  Findings consistent with prior inferior myocardial infarction. There is no current ischemia  This is a low risk study.  The  left ventricular ejection fraction is hyperdynamic (>65%).  Duke treadmill score of 5, consistent with low risk for major cardiac events      Assessment and Plan  1. CAD -he has had intermittent somewhat atypical chest pain on and off over the last several years. Noninvaisve stress testing has been unremarkable, most recently in 08/2015. - - symptoms have progressed, now with progression DOE - given the ongoing and progressing nature of his symptoms that has not responded to increase in antianginal therapy or PPI, we will plan for left heart cath  2. HTN - at goal, conitnue current meds   3. Hyperlipideima - continue staitn    I have reviewed the risks, indications, and alternatives to cardiac catheterization, possible angioplasty, and stenting with the patient and his wife today. Risks include but are not limited to bleeding, infection, vascular injury, stroke,  myocardial infection, arrhythmia, kidney injury, radiation-related injury in the case of prolonged fluoroscopy use, emergency cardiac surgery, and death. The patient understands the risks of serious complication is 1-2 in 0349 with diagnostic cardiac cath and 1-2% or less with angioplasty/stenting.     Arnoldo Lenis, M.D.

## 2017-08-13 ENCOUNTER — Telehealth: Payer: Self-pay | Admitting: *Deleted

## 2017-08-13 NOTE — Telephone Encounter (Signed)
Pt contacted pre-catheterization scheduled at [facility] for: Friday Aug 14, 2017 9 AM Verified arrival time and place: [place] Entrance A at: 7 AM  No solid food after midnight prior to cath, clear liquids until 5 AM day of procedure. Verified allergies in Epic Verified no diabetes medications.  AM meds can be  taken pre-cath with sip of water including: ASA 81 mg  Confirmed patient has responsible person to drive home post procedure and observe patient for 24 hours: yes

## 2017-08-14 ENCOUNTER — Ambulatory Visit (HOSPITAL_COMMUNITY)
Admission: RE | Admit: 2017-08-14 | Discharge: 2017-08-14 | Disposition: A | Discharge status: Home / Self Care | Payer: Medicare HMO | Source: Ambulatory Visit | Attending: Cardiology | Admitting: Cardiology

## 2017-08-14 ENCOUNTER — Encounter (HOSPITAL_COMMUNITY): Payer: Self-pay | Admitting: Cardiology

## 2017-08-14 ENCOUNTER — Ambulatory Visit (HOSPITAL_COMMUNITY)
Admission: RE | Disposition: A | Discharge status: Home / Self Care | Payer: Self-pay | Source: Ambulatory Visit | Attending: Cardiology

## 2017-08-14 DIAGNOSIS — I25119 Atherosclerotic heart disease of native coronary artery with unspecified angina pectoris: Secondary | ICD-10-CM | POA: Diagnosis not present

## 2017-08-14 DIAGNOSIS — Z955 Presence of coronary angioplasty implant and graft: Secondary | ICD-10-CM | POA: Diagnosis not present

## 2017-08-14 DIAGNOSIS — I25110 Atherosclerotic heart disease of native coronary artery with unstable angina pectoris: Secondary | ICD-10-CM | POA: Insufficient documentation

## 2017-08-14 DIAGNOSIS — Z7951 Long term (current) use of inhaled steroids: Secondary | ICD-10-CM | POA: Insufficient documentation

## 2017-08-14 DIAGNOSIS — E7800 Pure hypercholesterolemia, unspecified: Secondary | ICD-10-CM | POA: Diagnosis present

## 2017-08-14 DIAGNOSIS — I252 Old myocardial infarction: Secondary | ICD-10-CM | POA: Diagnosis not present

## 2017-08-14 DIAGNOSIS — Z7982 Long term (current) use of aspirin: Secondary | ICD-10-CM | POA: Diagnosis not present

## 2017-08-14 DIAGNOSIS — R42 Dizziness and giddiness: Secondary | ICD-10-CM | POA: Diagnosis not present

## 2017-08-14 DIAGNOSIS — R0789 Other chest pain: Secondary | ICD-10-CM

## 2017-08-14 DIAGNOSIS — I209 Angina pectoris, unspecified: Secondary | ICD-10-CM | POA: Diagnosis present

## 2017-08-14 DIAGNOSIS — K219 Gastro-esophageal reflux disease without esophagitis: Secondary | ICD-10-CM | POA: Diagnosis not present

## 2017-08-14 DIAGNOSIS — M109 Gout, unspecified: Secondary | ICD-10-CM | POA: Diagnosis not present

## 2017-08-14 DIAGNOSIS — I10 Essential (primary) hypertension: Secondary | ICD-10-CM | POA: Diagnosis present

## 2017-08-14 HISTORY — PX: LEFT HEART CATH AND CORONARY ANGIOGRAPHY: CATH118249

## 2017-08-14 SURGERY — LEFT HEART CATH AND CORONARY ANGIOGRAPHY
Anesthesia: LOCAL

## 2017-08-14 MED ORDER — VERAPAMIL HCL 2.5 MG/ML IV SOLN
INTRAVENOUS | Status: AC
  Filled 2017-08-14: qty 2

## 2017-08-14 MED ORDER — SODIUM CHLORIDE 0.9 % WEIGHT BASED INFUSION: 1.0000 mL/kg/h | INTRAVENOUS | Status: DC

## 2017-08-14 MED ORDER — MIDAZOLAM HCL 2 MG/2ML IJ SOLN
INTRAMUSCULAR | Status: AC
  Filled 2017-08-14: qty 2

## 2017-08-14 MED ORDER — FENTANYL CITRATE (PF) 100 MCG/2ML IJ SOLN
INTRAMUSCULAR | Status: AC
  Filled 2017-08-14: qty 2

## 2017-08-14 MED ORDER — SODIUM CHLORIDE 0.9% FLUSH: 3.0000 mL | Freq: Two times a day (BID) | INTRAVENOUS | Status: DC

## 2017-08-14 MED ORDER — HEPARIN SODIUM (PORCINE) 1000 UNIT/ML IJ SOLN
INTRAMUSCULAR | Status: DC | PRN
  Administered 2017-08-14: 4000 [IU] via INTRAVENOUS

## 2017-08-14 MED ORDER — MIDAZOLAM HCL 2 MG/2ML IJ SOLN
INTRAMUSCULAR | Status: DC | PRN
  Administered 2017-08-14: 1 mg via INTRAVENOUS

## 2017-08-14 MED ORDER — LIDOCAINE HCL (PF) 1 % IJ SOLN
INTRAMUSCULAR | Status: DC | PRN
  Administered 2017-08-14: 2 mL

## 2017-08-14 MED ORDER — ACETAMINOPHEN 325 MG PO TABS: 650.0000 mg | ORAL_TABLET | ORAL | Status: DC | PRN

## 2017-08-14 MED ORDER — SODIUM CHLORIDE 0.9% FLUSH: 3.0000 mL | INTRAVENOUS | Status: DC | PRN

## 2017-08-14 MED ORDER — LIDOCAINE HCL (PF) 1 % IJ SOLN
INTRAMUSCULAR | Status: AC
  Filled 2017-08-14: qty 30

## 2017-08-14 MED ORDER — SODIUM CHLORIDE 0.9 % WEIGHT BASED INFUSION
3.0000 mL/kg/h | INTRAVENOUS | Status: AC
  Administered 2017-08-14: 3 mL/kg/h via INTRAVENOUS

## 2017-08-14 MED ORDER — IOPAMIDOL (ISOVUE-370) INJECTION 76%
INTRAVENOUS | Status: AC
  Filled 2017-08-14: qty 100

## 2017-08-14 MED ORDER — VERAPAMIL HCL 2.5 MG/ML IV SOLN
INTRAVENOUS | Status: DC | PRN
  Administered 2017-08-14: 10 mL via INTRA_ARTERIAL

## 2017-08-14 MED ORDER — HEPARIN (PORCINE) IN NACL 1000-0.9 UT/500ML-% IV SOLN
INTRAVENOUS | Status: AC
  Filled 2017-08-14: qty 1000

## 2017-08-14 MED ORDER — IOPAMIDOL (ISOVUE-370) INJECTION 76%
INTRAVENOUS | Status: DC | PRN
  Administered 2017-08-14: 60 mL

## 2017-08-14 MED ORDER — SODIUM CHLORIDE 0.9 % IV SOLN: 250.0000 mL | INTRAVENOUS | Status: DC | PRN

## 2017-08-14 MED ORDER — FENTANYL CITRATE (PF) 100 MCG/2ML IJ SOLN
INTRAMUSCULAR | Status: DC | PRN
  Administered 2017-08-14: 25 ug via INTRAVENOUS

## 2017-08-14 MED ORDER — ASPIRIN 81 MG PO CHEW: 81.0000 mg | CHEWABLE_TABLET | ORAL | Status: DC

## 2017-08-14 MED ORDER — HEPARIN SODIUM (PORCINE) 1000 UNIT/ML IJ SOLN
INTRAMUSCULAR | Status: AC
  Filled 2017-08-14: qty 1

## 2017-08-14 MED ORDER — ONDANSETRON HCL 4 MG/2ML IJ SOLN: 4.0000 mg | Freq: Four times a day (QID) | INTRAMUSCULAR | Status: DC | PRN

## 2017-08-14 MED ORDER — HEPARIN (PORCINE) IN NACL 2-0.9 UNITS/ML
INTRAMUSCULAR | Status: AC | PRN
  Administered 2017-08-14 (×2): 500 mL

## 2017-08-14 SURGICAL SUPPLY — 14 items
CATH INFINITI 5 FR JL3.5 (CATHETERS) ×1 IMPLANT
CATH INFINITI 5FR ANG PIGTAIL (CATHETERS) ×1 IMPLANT
CATH INFINITI JR4 5F (CATHETERS) ×1 IMPLANT
DEVICE RAD COMP TR BAND LRG (VASCULAR PRODUCTS) ×1 IMPLANT
GUIDEWIRE INQWIRE 1.5J.035X260 (WIRE) IMPLANT
INQWIRE 1.5J .035X260CM (WIRE) ×2
KIT HEART LEFT (KITS) ×2 IMPLANT
NDL PERC 21GX4CM (NEEDLE) IMPLANT
NEEDLE PERC 21GX4CM (NEEDLE) ×2 IMPLANT
PACK CARDIAC CATHETERIZATION (CUSTOM PROCEDURE TRAY) ×2 IMPLANT
SHEATH RAIN RADIAL 21G 6FR (SHEATH) ×1 IMPLANT
SYR MEDRAD MARK V 150ML (SYRINGE) ×2 IMPLANT
TRANSDUCER W/STOPCOCK (MISCELLANEOUS) ×2 IMPLANT
TUBING CIL FLEX 10 FLL-RA (TUBING) ×2 IMPLANT

## 2017-08-14 NOTE — Interval H&P Note (Signed)
History and Physical Interval Note:  08/14/2017 8:54 AM  Dennis [name]  has presented today for surgery, with the diagnosis of cp  The various methods of treatment have been discussed with the patient and family. After consideration of risks, benefits and other options for treatment, the patient has consented to  Procedure(s): LEFT HEART CATH AND CORONARY ANGIOGRAPHY (N/A) as a surgical intervention .  The patient's history has been reviewed, patient examined, no change in status, stable for surgery.  I have reviewed the patient's chart and labs.  Questions were answered to the patient's satisfaction.   Cath Lab Visit (complete for each Cath Lab visit)  Clinical Evaluation Leading to the Procedure:   ACS: No.  Non-ACS:    Anginal Classification: CCS III  Anti-ischemic medical therapy: Maximal Therapy (2 or more classes of medications)  Non-Invasive Test Results: No non-invasive testing performed  Prior CABG: No previous CABG        Dennis [name] [facility] 08/14/2017 8:54 AM

## 2017-08-14 NOTE — Discharge Instructions (Signed)
Drink plenty of fluids over next 48 hours and keep right wrist elevated at heart level for 24 hours ° °Radial Site Care °Refer to this sheet in the next few weeks. These instructions provide you with information about caring for yourself after your procedure. Your health care provider may also give you more specific instructions. Your treatment has been planned according to current medical practices, but problems sometimes occur. Call your health care provider if you have any problems or questions after your procedure. °What can I expect after the procedure? °After your procedure, it is typical to have the following: °· Bruising at the radial site that usually fades within 1-2 weeks. °· Blood collecting in the tissue (hematoma) that may be painful to the touch. It should usually decrease in size and tenderness within 1-2 weeks. ° °Follow these instructions at home: °· Take medicines only as directed by your health care provider. °· You may shower 24-48 hours after the procedure or as directed by your health care provider. Remove the bandage (dressing) and gently wash the site with plain soap and water. Pat the area dry with a clean towel. Do not rub the site, because this may cause bleeding. °· Do not take baths, swim, or use a hot tub until your health care provider approves. °· Check your insertion site every day for redness, swelling, or drainage. °· Do not apply powder or lotion to the site. °· Do not flex or bend the affected arm for 24 hours or as directed by your health care provider. °· Do not push or pull heavy objects with the affected arm for 24 hours or as directed by your health care provider. °· Do not lift over 10 lb (4.5 kg) for 5 days after your procedure or as directed by your health care provider. °· Ask your health care provider when it is okay to: °? Return to work or school. °? Resume usual physical activities or sports. °? Resume sexual activity. °· Do not drive home if you are discharged the  same day as the procedure. Have someone else drive you. °· You may drive 24 hours after the procedure unless otherwise instructed by your health care provider. °· Do not operate machinery or power tools for 24 hours after the procedure. °· If your procedure was done as an outpatient procedure, which means that you went home the same day as your procedure, a responsible adult should be with you for the first 24 hours after you arrive home. °· Keep all follow-up visits as directed by your health care provider. This is important. °Contact a health care provider if: °· You have a fever. °· You have chills. °· You have increased bleeding from the radial site. Hold pressure on the site. °Get help right away if: °· You have unusual pain at the radial site. °· You have redness, warmth, or swelling at the radial site. °· You have drainage (other than a small amount of blood on the dressing) from the radial site. °· The radial site is bleeding, and the bleeding does not stop after 30 minutes of holding steady pressure on the site. °· Your arm or hand becomes pale, cool, tingly, or numb. °This information is not intended to replace advice given to you by your health care provider. Make sure you discuss any questions you have with your health care provider. °Document Released: 04/26/2010 Document Revised: 08/30/2015 Document Reviewed: 10/10/2013 °Elsevier Interactive Patient Education © 2018 [employer] ° °

## 2017-08-18 MED FILL — Heparin Sod (Porcine)-NaCl IV Soln 1000 Unit/500ML-0.9%: INTRAVENOUS | Qty: 1000 | Status: AC

## 2017-09-07 NOTE — Progress Notes (Signed)
Cardiology Office Note    Date:  09/08/2017   ID:  Dennis [name], DOB 03/20/1951, MRN [id number]  PCP:  [name], MD  Cardiologist: Carlyle Dolly, MD    Chief Complaint  Patient presents with  . Follow-up    s/p cardiac catheterization    History of Present Illness:    Dennis [name] is a 67 y.o. male with past medical history of CAD (s/p DES to LAD, DES to RCA, and PTCA to D1 in 06/2007), HTN, HLD, and GERD who presents to the office today for follow-up from his recent cardiac catheterization.   He was last examined by Dr. Harl Bowie on 08/07/2017 and reported intermittent episodes of chest pain for the past several months. His antianginal regimen had been titrated but symptoms persisted, therefore a cardiac catheterization was recommended for definitive evaluation. This was performed on 08/14/2017 and showed patent stents along the LAD and RCA with 70% D1 stenosis, 50% LCx stenosis, 60% mid-RCA and 60% Acute Mrg stenosis. No culprit lesion was identified for his recent chest pain and continued medical therapy was recommended.  In talking with the patient today, he reports overall doing well since his recent cardiac catheterization. His episodes of chest pain have mostly resolved. Denies any specific dyspnea on exertion, orthopnea, PND, or lower extremity edema. He went walking with his wife on a two-mile trail this past weekend without any recurrent anginal symptoms.  He has not checked his blood pressure regularly at home but it is well controlled at 120/76 during today's visit.   Past Medical History:  Diagnosis Date  . Coronary artery disease    a. s/p DES to LAD with DES to RCA, and PTCA to D1 in 06/2007 b. 08/2017: cath showing patent stents with 70% D1, 50% LCx, 60% mid-RCA, and 60% Acute Mrg stenosis. Medical therapy recommended.   Marland Kitchen GERD (gastroesophageal reflux disease)   . Gout   . Hypercholesteremia   . Hypertension   . Myocardial infarction ([place]) 1998    Past  Surgical History:  Procedure Laterality Date  . bilateral eye surgery    . CARDIAC CATHETERIZATION  2009   PCI with DES to LAD,PTCA to diagonal, and DES to RCA LV gram LVEF 60%   . CARDIAC SURGERY    . COLONOSCOPY    . COLONOSCOPY  05/14/2011   Procedure: COLONOSCOPY;  Surgeon: Daneil Dolin, MD;  [facility];  Service: Endoscopy;  Laterality: N/A;  8:15 AM  . LEFT HEART CATH AND CORONARY ANGIOGRAPHY N/A 08/14/2017   Procedure: LEFT HEART CATH AND CORONARY ANGIOGRAPHY;  Surgeon: Martinique, Peter M, MD;  Location: [facility];  Service: Cardiovascular;  Laterality: N/A;    Current Medications: Outpatient Medications Prior to Visit  Medication Sig Dispense Refill  . acetaminophen (TYLENOL) 500 MG tablet Take 1,000 mg by mouth every 6 (six) hours as needed for mild pain or headache.     Marland Kitchen aspirin (ASPIRIN EC) 81 MG EC tablet Take 81 mg by mouth at bedtime.     Marland Kitchen atorvastatin (LIPITOR) 10 MG tablet Take 10 mg by mouth at bedtime.   1  . carboxymethylcellulose (REFRESH TEARS) 0.5 % SOLN Place 1 drop into both eyes daily as needed (for dry eyes).    . fluticasone (FLONASE) 50 MCG/ACT nasal spray Place 1 spray into both nostrils daily as needed for allergies or rhinitis.     Marland Kitchen isosorbide mononitrate (IMDUR) 60 MG 24 hr tablet Take 1 tablet (60 mg  total) by mouth daily. (Patient taking differently: Take 60 mg by mouth at bedtime. ) 90 tablet 3  . losartan (COZAAR) 100 MG tablet Take 100 mg by mouth daily.  1  . omeprazole (PRILOSEC) 40 MG capsule Take 40 mg by mouth daily.    . tamsulosin (FLOMAX) 0.4 MG CAPS capsule Take 0.4 mg by mouth daily.     . verapamil (CALAN-SR) 180 MG CR tablet Take 180 mg by mouth every morning.     . Probiotic Product (PROBIOTIC DAILY PO) Take 1 capsule by mouth daily.     No facility-administered medications prior to visit.      Allergies:   Patient has no known allergies.   Social History   Socioeconomic History  . Marital status: Married     Spouse name: Not on file  . Number of children: Not on file  . Years of education: Not on file  . Highest education level: Not on file  Occupational History  . Not on file  Social Needs  . Financial resource strain: Not on file  . Food insecurity:    Worry: Not on file    Inability: Not on file  . Transportation needs:    Medical: Not on file    Non-medical: Not on file  Tobacco Use  . Smoking status: Never Smoker  . Smokeless tobacco: Never Used  Substance and Sexual Activity  . Alcohol use: Yes    Alcohol/week: 3.0 oz    Types: 5 Cans of beer per week    Comment: occ  . Drug use: No  . Sexual activity: Yes  Lifestyle  . Physical activity:    Days per week: Not on file    Minutes per session: Not on file  . Stress: Not on file  Relationships  . Social connections:    Talks on phone: Not on file    Gets together: Not on file    Attends religious service: Not on file    Active member of club or organization: Not on file    Attends meetings of clubs or organizations: Not on file    Relationship status: Not on file  Other Topics Concern  . Not on file  Social History Narrative  . Not on file     Family History:  The patient's family history includes Heart attack in his father; Stroke in his mother.   Review of Systems:   Please see the history of present illness.     General:  No chills, fever, night sweats or weight changes.  Cardiovascular:  No dyspnea on exertion, edema, orthopnea, palpitations, paroxysmal nocturnal dyspnea. Positive for chest pain (now resolved).  Dermatological: No rash, lesions/masses Respiratory: No cough, dyspnea Urologic: No hematuria, dysuria Abdominal:   No nausea, vomiting, diarrhea, bright red blood per rectum, melena, or hematemesis Neurologic:  No visual changes, wkns, changes in mental status. All other systems reviewed and are otherwise negative except as noted above.   Physical Exam:    VS:  BP 120/76   Pulse 76   Ht 5\' 6"   (1.676 m)   Wt 192 lb 12.8 oz (87.5 kg)   SpO2 96%   BMI 31.12 kg/m    General: Well developed, well nourished Caucasian male appearing in no acute distress. Head: Normocephalic, atraumatic, sclera non-icteric, no xanthomas, nares are without discharge.  Neck: No carotid bruits. JVD not elevated.  Lungs: Respirations regular and unlabored, without wheezes or rales.  Heart: Regular rate and rhythm. No S3 or  S4.  No murmur, no rubs, or gallops appreciated. Abdomen: Soft, non-tender, non-distended with normoactive bowel sounds. No hepatomegaly. No rebound/guarding. No obvious abdominal masses. Msk:  Strength and tone appear normal for age. No joint deformities or effusions. Extremities: No clubbing or cyanosis. No edema.  Distal pedal pulses are 2+ bilaterally. Radial site stable without ecchymosis or evidence of a hematoma.  Neuro: Alert and oriented X 3. Moves all extremities spontaneously. No focal deficits noted. Psych:  Responds to questions appropriately with a normal affect. Skin: No rashes or lesions noted  Wt Readings from Last 3 Encounters:  09/08/17 192 lb 12.8 oz (87.5 kg)  08/14/17 186 lb (84.4 kg)  08/07/17 190 lb 9.6 oz (86.5 kg)     Studies/Labs Reviewed:   EKG:  EKG is not ordered today.   Recent Labs: 08/07/2017: BUN 13; Creatinine, Ser 0.81; Hemoglobin 14.8; Platelets 143; Potassium 4.0; Sodium 140   Lipid Panel    Component Value Date/Time   CHOL 129 04/27/2014 1053   TRIG 143 04/27/2014 1053   HDL 37 (L) 04/27/2014 1053   CHOLHDL 3.5 04/27/2014 1053   VLDL 29 04/27/2014 1053   LDLCALC 63 04/27/2014 1053    Additional studies/ records that were reviewed today include:   Cardiac Catheterization: 08/14/2017  Previously placed Prox LAD to Mid LAD stent (unknown type) is widely patent.  1st Diag lesion is 70% stenosed.  Mid Cx to Dist Cx lesion is 50% stenosed.  Previously placed Prox RCA to Mid RCA stent (unknown type) is widely patent.  Prox RCA lesion  is 40% stenosed.  Acute Mrg lesion is 60% stenosed.  Mid RCA lesion is 60% stenosed.  The left ventricular systolic function is normal.  LV end diastolic pressure is normal.  The left ventricular ejection fraction is 55-65% by visual estimate.   1. Modest 3 vessel CAD    - 70% first diagonal    - 50% long distal LCx    - 60% mid RCA and RV marginal branch 2. Continued excellent patency of stents in the LAD and RCA 3. Normal LV function  Plan: I do not see any critical disease that would explain his resting chest pain. The first diagonal was severely stenosed post stenting of the LAD and this has improved. I would recommend continued medical therapy.  Assessment:    1. Coronary artery disease involving native coronary artery of native heart without angina pectoris   2. Essential hypertension   3. Hyperlipidemia LDL goal <70      Plan:   In order of problems listed above:  1. CAD - s/p DES to LAD with DES to RCA and PTCA to D1 in 06/2007. Recently evaluated for recurrent chest pain and a repeat catheterization was performed which showed patent stents along the LAD and RCA with 70% D1 stenosis, 50% LCx stenosis, 60% mid-RCA and 60% Acute Mrg stenosis. No culprit lesion was identified and continued medical therapy was recommended. - he denies any recurrent chest pain or dyspnea on exertion. Has been walking for over 2 miles without recurrent anginal symptoms.  - continue ASA, Imdur, and statin therapy.   2. HTN - does not follow BP regularly at home but it is well-controlled at 120/76 during today's visit. - continue Imdur 60mg  daily, Losartan 100mg  daily, and Verapamil 180mg  daily.   3. HLD - followed by PCP. Recent FLP in 06/2017 showed LDL at 54. - continue Atorvastatin 10mg  daily.    Medication Adjustments/Labs and Tests Ordered: Current medicines  are reviewed at length with the patient today.  Concerns regarding medicines are outlined above.  Medication changes, Labs  and Tests ordered today are listed in the Patient Instructions below. Patient Instructions  Medication Instructions:  Your physician recommends that you continue on your current medications as directed. Please refer to the Current Medication list given to you today.  Labwork: NONE  Testing/Procedures: NONE  Follow-Up: Your physician wants you to follow-up in: 6 MONTHS  You will receive a reminder letter in the mail two months in advance. If you don't receive a letter, please call our office to schedule the follow-up appointment.  Any Other Special Instructions Will Be Listed Below (If Applicable).  If you need a refill on your cardiac medications before your next appointment, please call your pharmacy.    Signed, Dennis Heritage, Dennis [name]  09/08/2017 6:45 PM    [facility] [address] [place], [place] [address] Phone: [phone]

## 2017-09-08 ENCOUNTER — Encounter: Payer: Self-pay | Admitting: Student

## 2017-09-08 ENCOUNTER — Ambulatory Visit: Payer: Medicare HMO | Admitting: Student

## 2017-09-08 VITALS — BP 120/76 | HR 76 | Ht 66.0 in | Wt 192.8 lb

## 2017-09-08 DIAGNOSIS — I2510 Atherosclerotic heart disease of native coronary artery without angina pectoris: Secondary | ICD-10-CM

## 2017-09-08 DIAGNOSIS — I10 Essential (primary) hypertension: Secondary | ICD-10-CM | POA: Diagnosis not present

## 2017-09-08 DIAGNOSIS — E785 Hyperlipidemia, unspecified: Secondary | ICD-10-CM | POA: Diagnosis not present

## 2017-09-08 NOTE — Patient Instructions (Signed)

## 2017-09-10 DIAGNOSIS — M25561 Pain in right knee: Secondary | ICD-10-CM | POA: Diagnosis not present

## 2017-10-04 ENCOUNTER — Other Ambulatory Visit: Payer: Self-pay | Admitting: Cardiology

## 2017-10-29 ENCOUNTER — Ambulatory Visit (INDEPENDENT_AMBULATORY_CARE_PROVIDER_SITE_OTHER): Payer: Medicare HMO | Admitting: Otolaryngology

## 2017-11-02 ENCOUNTER — Ambulatory Visit (INDEPENDENT_AMBULATORY_CARE_PROVIDER_SITE_OTHER): Payer: Medicare HMO | Admitting: Otolaryngology

## 2017-11-02 DIAGNOSIS — J0101 Acute recurrent maxillary sinusitis: Secondary | ICD-10-CM | POA: Diagnosis not present

## 2017-11-02 DIAGNOSIS — J343 Hypertrophy of nasal turbinates: Secondary | ICD-10-CM

## 2017-11-02 DIAGNOSIS — H6123 Impacted cerumen, bilateral: Secondary | ICD-10-CM | POA: Diagnosis not present

## 2017-11-02 DIAGNOSIS — J310 Chronic rhinitis: Secondary | ICD-10-CM

## 2017-11-02 DIAGNOSIS — H903 Sensorineural hearing loss, bilateral: Secondary | ICD-10-CM

## 2017-12-15 DIAGNOSIS — I2510 Atherosclerotic heart disease of native coronary artery without angina pectoris: Secondary | ICD-10-CM | POA: Diagnosis not present

## 2017-12-15 DIAGNOSIS — I10 Essential (primary) hypertension: Secondary | ICD-10-CM | POA: Diagnosis not present

## 2018-01-14 DIAGNOSIS — Z23 Encounter for immunization: Secondary | ICD-10-CM | POA: Diagnosis not present

## 2018-03-23 DIAGNOSIS — K219 Gastro-esophageal reflux disease without esophagitis: Secondary | ICD-10-CM | POA: Diagnosis not present

## 2018-04-28 ENCOUNTER — Ambulatory Visit: Payer: Medicare HMO | Admitting: Cardiology

## 2018-04-29 ENCOUNTER — Ambulatory Visit: Payer: Medicare HMO | Admitting: Cardiology

## 2018-05-04 DIAGNOSIS — K219 Gastro-esophageal reflux disease without esophagitis: Secondary | ICD-10-CM | POA: Diagnosis not present

## 2018-05-05 ENCOUNTER — Encounter: Payer: Self-pay | Admitting: Cardiology

## 2018-05-05 ENCOUNTER — Ambulatory Visit: Payer: Medicare HMO | Admitting: Cardiology

## 2018-05-05 VITALS — BP 126/78 | HR 72 | Ht 66.0 in | Wt 195.0 lb

## 2018-05-05 DIAGNOSIS — I10 Essential (primary) hypertension: Secondary | ICD-10-CM | POA: Diagnosis not present

## 2018-05-05 DIAGNOSIS — E782 Mixed hyperlipidemia: Secondary | ICD-10-CM

## 2018-05-05 DIAGNOSIS — I2510 Atherosclerotic heart disease of native coronary artery without angina pectoris: Secondary | ICD-10-CM | POA: Diagnosis not present

## 2018-05-05 DIAGNOSIS — R42 Dizziness and giddiness: Secondary | ICD-10-CM

## 2018-05-05 NOTE — Progress Notes (Signed)
Clinical Summary Dennis [name] is a 68 y.o.male  seen today for follow up of the following medical problems.    1. CAD  - cath 06/2007 in the setting of unstable angina, PCI with DES to LAD,PTCA to diagonal, and DES to RCA LV gram LVEF 60%  - 01/2013 Stress MPI low risk study, no ischemia - 01/2013 echo LVEF 60-65%, grade II diastolic dysfunction - 07/7094 exercise nuclear stress test: no ischemia    08/2017 cath moderate CAD as reported below, patent stents  - no recent chest pain. Reports pcp changed antacid pill and symptoms improved - compliant with meds  2. Dizziness  - orthostatic at priorclinic visit. Symptoms have improved off HCTZ and with increaesd hydration.    - occasional dizziness at times. Has cut back some on his regular hydration.    3. HTN - he is compliant with meds  4. Hyperlipidemia - prior mylagias on lipitor 20 mg daily,he had done well onlipitor 10mg  daily.    - upcoming labs pcp    SH: often travels to [place]. Recent traveled to [place]. Enjoys classic cars. He was good friends with a patient of mine who recently passed away [name]. He still remains in touch with his wife Dennis [name] who is also a patient of mine.   Past Medical History:  Diagnosis Date  . Coronary artery disease    a. s/p DES to LAD with DES to RCA, and PTCA to D1 in 06/2007 b. 08/2017: cath showing patent stents with 70% D1, 50% LCx, 60% mid-RCA, and 60% Acute Mrg stenosis. Medical therapy recommended.   Marland Kitchen GERD (gastroesophageal reflux disease)   . Gout   . Hypercholesteremia   . Hypertension   . Myocardial infarction ([place]) 1998     No Known Allergies   Current Outpatient Medications  Medication Sig Dispense Refill  . acetaminophen (TYLENOL) 500 MG tablet Take 1,000 mg by mouth every 6 (six) hours as needed for mild pain or headache.     Marland Kitchen aspirin (ASPIRIN EC) 81 MG EC tablet Take 81 mg by mouth at bedtime.     Marland Kitchen atorvastatin  (LIPITOR) 10 MG tablet Take 10 mg by mouth at bedtime.   1  . carboxymethylcellulose (REFRESH TEARS) 0.5 % SOLN Place 1 drop into both eyes daily as needed (for dry eyes).    . fluticasone (FLONASE) 50 MCG/ACT nasal spray Place 1 spray into both nostrils daily as needed for allergies or rhinitis.     Marland Kitchen isosorbide mononitrate (IMDUR) 60 MG 24 hr tablet Take 1 tablet (60 mg total) by mouth daily. (Patient taking differently: Take 60 mg by mouth at bedtime. ) 90 tablet 3  . losartan (COZAAR) 100 MG tablet Take 100 mg by mouth daily.  1  . omeprazole (PRILOSEC) 40 MG capsule Take 40 mg by mouth daily.    Marland Kitchen omeprazole (PRILOSEC) 40 MG capsule TAKE 1 CAPSULE BY MOUTH ONCE DAILY 90 capsule 3  . tamsulosin (FLOMAX) 0.4 MG CAPS capsule Take 0.4 mg by mouth daily.     . verapamil (CALAN-SR) 180 MG CR tablet Take 180 mg by mouth every morning.      No current facility-administered medications for this visit.      Past Surgical History:  Procedure Laterality Date  . bilateral eye surgery    . CARDIAC CATHETERIZATION  2009   PCI with DES to LAD,PTCA to diagonal, and DES to RCA LV gram LVEF 60%   . CARDIAC  SURGERY    . COLONOSCOPY    . COLONOSCOPY  05/14/2011   Procedure: COLONOSCOPY;  Surgeon: Daneil Dolin, MD;  [facility];  Service: Endoscopy;  Laterality: N/A;  8:15 AM  . LEFT HEART CATH AND CORONARY ANGIOGRAPHY N/A 08/14/2017   Procedure: LEFT HEART CATH AND CORONARY ANGIOGRAPHY;  Surgeon: Martinique, Peter M, MD;  Location: [facility];  Service: Cardiovascular;  Laterality: N/A;     No Known Allergies    Family History  Problem Relation Age of Onset  . Stroke Mother   . Heart attack Father   . Colon cancer Neg Hx      Social History Mr. Dunne reports that he has never smoked. He has never used smokeless tobacco. Mr. Craine reports current alcohol use of about 5.0 standard drinks of alcohol per week.   Review of Systems CONSTITUTIONAL: No weight loss, fever,  chills, weakness or fatigue.  HEENT: Eyes: No visual loss, blurred vision, double vision or yellow sclerae.No hearing loss, sneezing, congestion, runny nose or sore throat.  SKIN: No rash or itching.  CARDIOVASCULAR: per hpi RESPIRATORY: No shortness of breath, cough or sputum.  GASTROINTESTINAL: No anorexia, nausea, vomiting or diarrhea. No abdominal pain or blood.  GENITOURINARY: No burning on urination, no polyuria NEUROLOGICAL: No headache, dizziness, syncope, paralysis, ataxia, numbness or tingling in the extremities. No change in bowel or bladder control.  MUSCULOSKELETAL: No muscle, back pain, joint pain or stiffness.  LYMPHATICS: No enlarged nodes. No history of splenectomy.  PSYCHIATRIC: No history of depression or anxiety.  ENDOCRINOLOGIC: No reports of sweating, cold or heat intolerance. No polyuria or polydipsia.  Marland Kitchen   Physical Examination Vitals:   05/05/18 0934  BP: 126/78  Pulse: 72  SpO2: 95%   Vitals:   05/05/18 0934  Weight: 195 lb (88.5 kg)  Height: 5\' 6"  (1.676 m)    Gen: resting comfortably, no acute distress HEENT: no scleral icterus, pupils equal round and reactive, no palptable cervical adenopathy,  CV: RRR, no m/r/g, no jvd Resp: Clear to auscultation bilaterally GI: abdomen is soft, non-tender, non-distended, normal bowel sounds, no hepatosplenomegaly MSK: extremities are warm, no edema.  Skin: warm, no rash Neuro:  no focal deficits Psych: appropriate affect   Diagnostic Studies 01/2013 Stress MPI Analysis of the raw perfusion data finds radiotracer uptake in the gut adjacent to the inferior wall.  Tomographic views were obtained using the short axis, vertical long axis, and horizontal long axis planes. There is a small, mild to moderate intensity, inferior perfusion defect that is fixed. No significant reversible defects to indicate ischemia.  Gated imaging reveals an EDV of 70, ESV of 24, normal TID ratio of 0.89, and LVEF of 65% without  wall motion abnormality.  IMPRESSION: Low risk exercise Cardiolite. No chest pain reported, there were no diagnostic ST segment changes at maximum work load of 10.1 METS. Perfusion imaging is most consistent with inferior wall attenuation, no definite scar or ischemic defects. LV volumes are normal with LVEF 65% and no focal wall motion abnormalities.  01/2013 Echo Study Conclusions  - Left ventricle: The cavity size was normal. There was mild concentric hypertrophy. Systolic function was normal. The estimated ejection fraction was in the range of 60% to 65%. Wall motion was normal; there were no regional wall motion abnormalities. Features are consistent with a pseudonormal left ventricular filling pattern, with concomitant abnormal relaxation and increased filling pressure (grade 2 diastolic dysfunction). - Mitral valve: Trivial regurgitation. - Left atrium:  The atrium was at the upper limits of normal in size. - Tricuspid valve: Physiologic regurgitation. - Pulmonary arteries: Systolic pressure could not be accurately estimated. - Inferior vena cava: Poorly visualized. Unable to estimate CVP. - Pericardium, extracardiac: There was no pericardial effusion. Impressions:  - Comparison to prior study March 2009. Mild LVH with LVEF 68-08%, grade 2 diastolic dysfunction. Upper normal left atrial size. Trivial mitral and tricuspid regurgitation. Unable to assess PASP or CVP.   08/2015 MPI  There was no ST segment deviation noted during stress.  Findings consistent with prior inferior myocardial infarction. There is no current ischemia  This is a low risk study.  The left ventricular ejection fraction is hyperdynamic (>65%).  Duke treadmill score of 5, consistent with low risk for major cardiac events  08/2017 cath  Previously placed Prox LAD to Mid LAD stent (unknown type) is widely patent.  1st Diag lesion is 70% stenosed.  Mid Cx to  Dist Cx lesion is 50% stenosed.  Previously placed Prox RCA to Mid RCA stent (unknown type) is widely patent.  Prox RCA lesion is 40% stenosed.  Acute Mrg lesion is 60% stenosed.  Mid RCA lesion is 60% stenosed.  The left ventricular systolic function is normal.  LV end diastolic pressure is normal.  The left ventricular ejection fraction is 55-65% by visual estimate.   1. Modest 3 vessel CAD    - 70% first diagonal    - 50% long distal LCx    - 60% mid RCA and RV marginal branch 2. Continued excellent patency of stents in the LAD and RCA 3. Normal LV function  Plan: I do not see any critical disease that would explain his resting chest pain. The first diagonal was severely stenosed post stenting of the LAD and this has improved. I would recommend continued medical therapy.   Assessment and Plan  1. CAD -no recent symptoms, continue current meds  2. HTN - he is at goal, continue current meds   3. Hyperlipideima - f/u pcp labs, continue statin  4. Dizziness - he has been orthostatic previously in clinic. Encouraged increased hydration. His nitrate and alpha blocker could be playing some role - monitor with increased hydration.   F/u 6 months    Arnoldo Lenis, M.D

## 2018-05-05 NOTE — Patient Instructions (Signed)

## 2018-05-16 ENCOUNTER — Other Ambulatory Visit: Payer: Self-pay | Admitting: Cardiology

## 2018-06-15 DIAGNOSIS — R5383 Other fatigue: Secondary | ICD-10-CM | POA: Diagnosis not present

## 2018-06-15 DIAGNOSIS — M25511 Pain in right shoulder: Secondary | ICD-10-CM | POA: Diagnosis not present

## 2018-06-16 DIAGNOSIS — I2510 Atherosclerotic heart disease of native coronary artery without angina pectoris: Secondary | ICD-10-CM | POA: Diagnosis not present

## 2018-06-16 DIAGNOSIS — K219 Gastro-esophageal reflux disease without esophagitis: Secondary | ICD-10-CM | POA: Diagnosis not present

## 2018-06-16 DIAGNOSIS — Z125 Encounter for screening for malignant neoplasm of prostate: Secondary | ICD-10-CM | POA: Diagnosis not present

## 2018-06-16 DIAGNOSIS — Z79899 Other long term (current) drug therapy: Secondary | ICD-10-CM | POA: Diagnosis not present

## 2018-06-16 DIAGNOSIS — D519 Vitamin B12 deficiency anemia, unspecified: Secondary | ICD-10-CM | POA: Diagnosis not present

## 2018-06-16 DIAGNOSIS — E785 Hyperlipidemia, unspecified: Secondary | ICD-10-CM | POA: Diagnosis not present

## 2018-06-16 DIAGNOSIS — I10 Essential (primary) hypertension: Secondary | ICD-10-CM | POA: Diagnosis not present

## 2018-06-24 DIAGNOSIS — M7551 Bursitis of right shoulder: Secondary | ICD-10-CM | POA: Diagnosis not present

## 2018-06-24 DIAGNOSIS — M25511 Pain in right shoulder: Secondary | ICD-10-CM | POA: Diagnosis not present

## 2018-07-13 DIAGNOSIS — I10 Essential (primary) hypertension: Secondary | ICD-10-CM | POA: Diagnosis not present

## 2018-07-13 DIAGNOSIS — E538 Deficiency of other specified B group vitamins: Secondary | ICD-10-CM | POA: Diagnosis not present

## 2018-07-13 DIAGNOSIS — E785 Hyperlipidemia, unspecified: Secondary | ICD-10-CM | POA: Diagnosis not present

## 2018-07-13 DIAGNOSIS — I2510 Atherosclerotic heart disease of native coronary artery without angina pectoris: Secondary | ICD-10-CM | POA: Diagnosis not present

## 2018-08-06 DIAGNOSIS — M7551 Bursitis of right shoulder: Secondary | ICD-10-CM | POA: Diagnosis not present

## 2018-08-09 ENCOUNTER — Telehealth (HOSPITAL_COMMUNITY): Payer: Self-pay

## 2018-08-09 ENCOUNTER — Encounter (HOSPITAL_COMMUNITY): Payer: Self-pay

## 2018-08-09 NOTE — Telephone Encounter (Signed)
Called patient to discuss referral for OT services. Dr. Victorino December sent a referral for patient right shoulder bursitis. Patient reports that he has some when reaching although he is able to do all daily tasks. Per last MD visit, patient reports that he is 85% better and has the most difficulty with internal rotation and abduction movements. Pt confirms information. Therapist recommends a HEP of shoulder stretches which will be sent to patient either via MyChart or directly through snail mail. Patient is instructed to complete HEP and OT will follow up with him in 2 weeks to determine if an in clinic visit is needed. Patient agrees with recommendation.   Ailene Ravel, OTR/L,CBIS  [phone]

## 2018-08-13 ENCOUNTER — Telehealth (HOSPITAL_COMMUNITY): Payer: Self-pay

## 2018-08-13 NOTE — Telephone Encounter (Signed)
Pt l/m to give Mickel Baas notice that he had not received HEP yet.

## 2018-08-16 ENCOUNTER — Telehealth (HOSPITAL_COMMUNITY): Payer: Self-pay

## 2018-08-16 NOTE — Telephone Encounter (Signed)
Patient called clinic stating he had not received his HEP in the mail as of today.   E-mail was provided and therapist e-mail a shoulder HEP to patient. Therapist will follow up with patient on September 06, 2018 regarding his shoulder.   Ailene Ravel, OTR/L,CBIS  [phone]

## 2018-09-08 ENCOUNTER — Telehealth (HOSPITAL_COMMUNITY): Payer: Self-pay

## 2018-09-08 ENCOUNTER — Telehealth (HOSPITAL_COMMUNITY): Payer: Self-pay | Admitting: Specialist

## 2018-09-08 NOTE — Telephone Encounter (Signed)
Pt called back and wanted to get in before he sees the MD on 09/20/2018. Pt was scheduled for tomorrow at 1:30 for eva

## 2018-09-08 NOTE — Telephone Encounter (Signed)
L/m requesting return phone call to cx or schedule this referral. If we don't hear back from pt by 09/22/2018 referral will be closed.

## 2018-09-09 ENCOUNTER — Other Ambulatory Visit: Payer: Self-pay

## 2018-09-09 ENCOUNTER — Encounter (HOSPITAL_COMMUNITY): Payer: Self-pay | Admitting: Specialist

## 2018-09-09 ENCOUNTER — Ambulatory Visit (HOSPITAL_COMMUNITY): Payer: Medicare HMO | Attending: Orthopedic Surgery | Admitting: Specialist

## 2018-09-09 ENCOUNTER — Telehealth (HOSPITAL_COMMUNITY): Payer: Self-pay | Admitting: Internal Medicine

## 2018-09-09 DIAGNOSIS — M25511 Pain in right shoulder: Secondary | ICD-10-CM | POA: Diagnosis not present

## 2018-09-09 DIAGNOSIS — M25611 Stiffness of right shoulder, not elsewhere classified: Secondary | ICD-10-CM | POA: Insufficient documentation

## 2018-09-09 NOTE — Therapy (Signed)
[place], [place], [address] Phone: [phone]   Fax:  [phone]  Occupational Therapy Evaluation  Patient Details  Name: Dennis [name] MRN: [id number] Date of Birth: [birth year] Referring Provider (OT): Dr. Nicholes Stairs   Encounter Date: 09/09/2018  OT End of Session - 09/09/18 1524    Visit Number  1    Number of Visits  16    Date for OT Re-Evaluation  11/04/18   mini reassess on 10/09/18   Authorization Type  [employer] Medicare, no visit limit     OT Start Time  1304    OT Stop Time  1350    OT Time Calculation (min)  46 min    Activity Tolerance  Patient tolerated treatment well    Behavior During Therapy  [facility] for tasks assessed/performed       Past Medical History:  Diagnosis Date  . Coronary artery disease    a. s/p DES to LAD with DES to RCA, and PTCA to D1 in 06/2007 b. 08/2017: cath showing patent stents with 70% D1, 50% LCx, 60% mid-RCA, and 60% Acute Mrg stenosis. Medical therapy recommended.   Marland Kitchen GERD (gastroesophageal reflux disease)   . Gout   . Hypercholesteremia   . Hypertension   . Myocardial infarction ([place]) 1998    Past Surgical History:  Procedure Laterality Date  . bilateral eye surgery    . CARDIAC CATHETERIZATION  2009   PCI with DES to LAD,PTCA to diagonal, and DES to RCA LV gram LVEF 60%   . CARDIAC SURGERY    . COLONOSCOPY    . COLONOSCOPY  05/14/2011   Procedure: COLONOSCOPY;  Surgeon: Daneil Dolin, MD;  [facility];  Service: Endoscopy;  Laterality: N/A;  8:15 AM  . LEFT HEART CATH AND CORONARY ANGIOGRAPHY N/A 08/14/2017   Procedure: LEFT HEART CATH AND CORONARY ANGIOGRAPHY;  Surgeon: Martinique, Peter M, MD;  Location: [place] [facility];  Service: Cardiovascular;  Laterality: N/A;    There were no vitals filed for this visit.  Subjective Assessment - 09/09/18 1518    Subjective   S:  I had an injection at the beginning of the year that helped for  awhile.  It didnt last, though.    Pertinent History  Mr. Cleary presents today with pain and limited mobility in his dominant right shoulder.   Patient states that he first consulted with MD in early 2020 and was given a cortisone injection.  After the injection, he had some relief from his pain, however that has dissipated.  Patient was referred to OT for evaluation and treatment in March, however, due to [place] pandemic evaluation was not performed.  Patient presents today for evaluation and treatment.     Special Tests  FOTO 61% independent    Patient Stated Goals  I want to get back like I was     Pain Score  2     Pain Orientation  Right    Pain Type  Acute pain    Pain Radiating Towards  elbow    Pain Onset  More than a month ago    Pain Frequency  Intermittent    Aggravating Factors   with external and internal rotation pain can increase to an 8/10.    Pain Relieving Factors  rest        [facility] OT Assessment - 09/09/18 0001      Assessment   Medical Diagnosis  Right Shoulder Bursitis    Referring Provider (OT)  Dr. Nicholes Stairs    Onset Date/Surgical Date  --   January 2020   Hand Dominance  Right    Next MD Visit  09/17/18    Prior Therapy  none      Precautions   Precautions  None      Restrictions   Weight Bearing Restrictions  No      Balance Screen   Has the patient fallen in the past 6 months  No    Has the patient had a decrease in activity level because of a fear of falling?   No    Is the patient reluctant to leave their home because of a fear of falling?   No      Home  Environment   Family/patient expects to be discharged to:  Private residence    Lives With  Spouse      Prior Function   Level of [place]  Retired    Leisure  restoring old cars, spending time at his [place]      ADL   ADL comments  pain and limited mobility in his right shoulder causing decrease ability to reach overhead, behind back,  difficulty and pain lying on his right side and getting out of bed       Written Expression   Dominant Hand  Right      Vision - History   Baseline Vision  No visual deficits      Cognition   Overall Cognitive Status  Within Functional Limits for tasks assessed      Observation/Other Assessments   Focus on Therapeutic Outcomes (FOTO)   61% independent       Sensation   Light Touch  Appears Intact      Coordination   Gross Motor Movements are Fluid and Coordinated  Yes    Fine Motor Movements are Fluid and Coordinated  Yes      ROM / Strength   AROM / PROM / Strength  AROM;PROM;Strength      Palpation   Palpation comment  moderate fascial restrictions in right upper arm, scapular, and trapezius region of right shoulder      AROM   Overall AROM Comments  assessed in seated, external and internal rotation with shoulder adducted    AROM Assessment Site  Shoulder    Right/Left Shoulder  Right    Right Shoulder Flexion  150 Degrees    Right Shoulder ABduction  110 Degrees    Right Shoulder Internal Rotation  90 Degrees    Right Shoulder External Rotation  70 Degrees      PROM   Overall PROM Comments  WFL in supine      Strength   Overall Strength Comments  assessed in seated, external and internal rotation with shoulder adducted    Strength Assessment Site  Shoulder    Right/Left Shoulder  Right    Right Shoulder Flexion  4+/5    Right Shoulder ABduction  4+/5    Right Shoulder Internal Rotation  4+/5    Right Shoulder External Rotation  4+/5               OT Treatments/Exercises (OP) - 09/09/18 0001      Manual Therapy   Manual Therapy  Myofascial release    Manual therapy comments  manual therapy completed seperately from all other interventions this date    Myofascial  Release  myofascial release and manual stretching for right upper arm, scapular, and shoulder region to decrease pain and restrictions and improve pain free mobility in right shoulder region             OT Education - 09/09/18 1522    Education Details  patient was educated on HEP for shoulder table stretches for flexion, abduction, and external rotation forward leaning.     Person(s) Educated  Patient    Methods  Demonstration;Explanation    Comprehension  Verbalized understanding;Returned demonstration       OT Short Term Goals - 09/09/18 1530      OT SHORT TERM GOAL #1   Title  Patient will be educated on a HEP for improved right upper extremity use.     Time  4    Period  Weeks    Status  New    Target Date  10/07/18      OT SHORT TERM GOAL #2   Title  Patient will decrease pain in his right shoulder to 5/10 or less when threading belt through beltloops.     Time  4    Period  Weeks    Status  New      OT SHORT TERM GOAL #3   Title  Patient will improve right shoulder A/ROM to [facility] for improved ability to reach overhead and behind his back.     Time  4    Period  Weeks    Status  New      OT SHORT TERM GOAL #4   Title  Patient will decrease fascial restrictions from moderate to min-moderate for improved functional use of his right arm with less pain.     Time  4    Status  New        OT Long Term Goals - 09/09/18 1532      OT LONG TERM GOAL #1   Title  Patient will use his right arm as dominant with all functional and leisure activities without pain.     Time  8    Period  Weeks    Status  New    Target Date  11/04/18      OT LONG TERM GOAL #2   Title  Patient will have 2/10 pain or less when lying on his right side.     Time  8    Period  Weeks    Status  New      OT LONG TERM GOAL #3   Title  Patient will improve right shoulder A/ROM to WNL for improved ability to reach overhead and scratch his back without pain.     Time  8    Period  Weeks    Status  New      OT LONG TERM GOAL #4   Title  Patient will have 5/5 strength in his right shoulder without pain in order to lift car parts in his shop.     Time  8    Period  Weeks     Status  New      OT LONG TERM GOAL #5   Title  Patient will decrease fascial restrictions to minimal or less in his right shoulder in order to use his right arm with pain.      Time  8    Period  Weeks    Status  New            Plan - 09/09/18 1525  Clinical Impression Statement  A:  Patient is a 68 year old male with past medical history significant for arthritis, HTN, heart attack, and high cholesterol.  Patient has been experiencing increased pain and decreased mobility in his right shoulder for greater than 6 months.  He received a cortisone injection, which temporarily relieved his pain level.  Patient is unable to use his right arm normally as dominant due to limited range and increased pain in his right shoulder joint.     OT Occupational Profile and History  Problem Focused Assessment - Including review of records relating to presenting problem    Occupational performance deficits (Please refer to evaluation for details):  IADL's;ADL's;Rest and Sleep;Leisure    Body Structure / Function / Physical Skills  Muscle spasms;ROM;Strength;UE functional use;Fascial restriction    Rehab Potential  Good    Clinical Decision Making  Limited treatment options, no task modification necessary    Comorbidities Affecting Occupational Performance:  None    Modification or Assistance to Complete Evaluation   No modification of tasks or assist necessary to complete eval    OT Frequency  2x / week    OT Duration  8 weeks    OT Treatment/Interventions  Self-care/ADL training;Cryotherapy;Therapeutic exercise;DME and/or AE instruction;Manual Therapy;Neuromuscular education;Patient/family education;Therapeutic activities;Energy conservation;Moist Heat    Plan  P:  Skilled OT intervention 2 times per week for 8 weeks to decrease pain and fascial restrictions and improve pain free mobiilty and strength to WNL in order to use right arm as dominant without experiencing pain.  Next session begin manual  therapy, aa/rom, a/rom and progress as tolerated.     OT Home Exercise Plan  table slides, table external rotation stretch    Consulted and Agree with Plan of Care  Patient       Patient will benefit from skilled therapeutic intervention in order to improve the following deficits and impairments:  Body Structure / Function / Physical Skills  Visit Diagnosis: Acute pain of right shoulder  Stiffness of right shoulder, not elsewhere classified    Problem List Patient Active Problem List   Diagnosis Date Noted  . Angina pectoris ([place]) 08/19/2015  . Hyperglycemia 04/27/2014  . Gout   . Hypertension   . Cardiovascular disease 08/26/2009  . GASTROESOPHAGEAL REFLUX DISEASE 08/26/2009  . Hypercholesterolemia 01/15/2009  . RHINITIS, CHRONIC 01/15/2009    Vangie Bicker, [place], OTR/L [phone]  09/09/2018, 3:45 PM  [place], [place], [address] Phone: [phone]   Fax:  [phone]  Name: NAVEN GIAMBALVO MRN: [id number] Date of Birth: 07-21-1950

## 2018-09-09 NOTE — Telephone Encounter (Signed)
09/09/18  9:55am I called and spoke with wife and asked could patient come in at 1 instead of 1:30 and she said that he could

## 2018-09-09 NOTE — Patient Instructions (Signed)
SHOULDER: Flexion On Table   Place hands on towel placed on table, elbows straight. Lean forward with you upper body, pushing towel away from body. Hold ___ seconds. ___ reps per set, ___ sets per day  Abduction (Passive)   With arm out to side, resting on towel placed on table, keeping trunk away from table, lean to the side while pushing towel away from body. Hold ____ seconds. Repeat ____ times. Do ____ sessions per day.  Copyright  VHI. All rights reserved.     Internal Rotation (Assistive)   Seated with elbow bent at right angle and held against side, slide arm on table surface in an inward arc keeping elbow anchored in place. Repeat ____ times. Do ____ sessions per day. Activity: Use this motion to brush crumbs off the table.  Copyright  VHI. All rights reserved.   Table Stretch: External Rotation    Sit with right arm on table. Lean forward until a stretch is felt in shoulder. Hold ____ seconds. Repeat ____ times. Do ____ sessions per day.  http://gt2.exer.us/105   Copyright  VHI. All rights reserved.

## 2018-09-15 ENCOUNTER — Other Ambulatory Visit: Payer: Self-pay

## 2018-09-15 ENCOUNTER — Ambulatory Visit (HOSPITAL_COMMUNITY): Payer: Medicare HMO

## 2018-09-15 ENCOUNTER — Encounter (HOSPITAL_COMMUNITY): Payer: Self-pay

## 2018-09-15 DIAGNOSIS — M25611 Stiffness of right shoulder, not elsewhere classified: Secondary | ICD-10-CM

## 2018-09-15 DIAGNOSIS — M25511 Pain in right shoulder: Secondary | ICD-10-CM | POA: Diagnosis not present

## 2018-09-15 NOTE — Therapy (Signed)
[facility] [place] [place], [place], [address] Phone: [phone]   Fax:  [phone]  Occupational Therapy Treatment  Patient Details  Name: Dennis [name] MRN: [id number] Date of Birth: May 07, 1950 Referring Provider (OT): Dr. Nicholes Stairs   Encounter Date: 09/15/2018  OT End of Session - 09/15/18 1755    Visit Number  2    Number of Visits  16    Date for OT Re-Evaluation  11/04/18   mini reassess on 10/09/18   Authorization Type  [employer] Medicare, no visit limit     OT Start Time  1130    OT Stop Time  1215    OT Time Calculation (min)  45 min    Activity Tolerance  Patient tolerated treatment well    Behavior During Therapy  [facility] for tasks assessed/performed       Past Medical History:  Diagnosis Date  . Coronary artery disease    a. s/p DES to LAD with DES to RCA, and PTCA to D1 in 06/2007 b. 08/2017: cath showing patent stents with 70% D1, 50% LCx, 60% mid-RCA, and 60% Acute Mrg stenosis. Medical therapy recommended.   Marland Kitchen GERD (gastroesophageal reflux disease)   . Gout   . Hypercholesteremia   . Hypertension   . Myocardial infarction ([place]) 1998    Past Surgical History:  Procedure Laterality Date  . bilateral eye surgery    . CARDIAC CATHETERIZATION  2009   PCI with DES to LAD,PTCA to diagonal, and DES to RCA LV gram LVEF 60%   . CARDIAC SURGERY    . COLONOSCOPY    . COLONOSCOPY  05/14/2011   Procedure: COLONOSCOPY;  Surgeon: Daneil Dolin, MD;  [facility];  Service: Endoscopy;  Laterality: N/A;  8:15 AM  . LEFT HEART CATH AND CORONARY ANGIOGRAPHY N/A 08/14/2017   Procedure: LEFT HEART CATH AND CORONARY ANGIOGRAPHY;  Surgeon: Martinique, Peter M, MD;  Location: [place] [facility];  Service: Cardiovascular;  Laterality: N/A;    There were no vitals filed for this visit.  Subjective Assessment - 09/15/18 1147    Subjective   S: Sometimes it hurts more and sometimes it ok. At night when I'm trying  to sleep it might wake me up.    Currently in Pain?  No/denies         [facility] OT Assessment - 09/15/18 1148      Assessment   Medical Diagnosis  Right Shoulder Bursitis      Precautions   Precautions  None               OT Treatments/Exercises (OP) - 09/15/18 1148      Exercises   Exercises  Shoulder      Shoulder Exercises: Supine   Protraction  PROM;AROM;10 reps    Horizontal ABduction  PROM;AROM;10 reps    External Rotation  PROM;AROM;10 reps    Internal Rotation  PROM;AROM;10 reps    Flexion  PROM;AROM;10 reps    ABduction  PROM;AROM;10 reps      Shoulder Exercises: Standing   Protraction  AROM;10 reps    Horizontal ABduction  AROM;10 reps    External Rotation  AROM;10 reps    Internal Rotation  AROM;10 reps    Flexion  AROM;10 reps    ABduction  AROM;10 reps      Shoulder Exercises: ROM/Strengthening   Wall Wash  1'    Other ROM/Strengthening Exercises  Standing wall stretching with towel  for shoulder flexion; 10X      Manual Therapy   Manual Therapy  Myofascial release    Manual therapy comments  manual therapy completed seperately from all other interventions this date    Myofascial Release  myofascial release and manual stretching for right upper arm, scapular, and shoulder region to decrease pain and restrictions and improve pain free mobility in right shoulder region             OT Education - 09/15/18 1754    Education Details  patient was instructed to stop table slides and start HEP that was sent via e-mail prior to starting in clinic: shoulder stretches. See media tab. Reviewed goals.     Person(s) Educated  Patient    Methods  Explanation;Demonstration;Handout    Comprehension  Returned demonstration;Verbalized understanding       OT Short Term Goals - 09/15/18 1148      OT SHORT TERM GOAL #1   Title  Patient will be educated on a HEP for improved right upper extremity use.     Time  4    Period  Weeks    Status  On-going     Target Date  10/07/18      OT SHORT TERM GOAL #2   Title  Patient will decrease pain in his right shoulder to 5/10 or less when threading belt through beltloops.     Time  4    Period  Weeks    Status  On-going      OT SHORT TERM GOAL #3   Title  Patient will improve right shoulder A/ROM to [facility] for improved ability to reach overhead and behind his back.     Time  4    Period  Weeks    Status  On-going      OT SHORT TERM GOAL #4   Title  Patient will decrease fascial restrictions from moderate to min-moderate for improved functional use of his right arm with less pain.     Time  4    Status  On-going        OT Long Term Goals - 09/15/18 1148      OT LONG TERM GOAL #1   Title  Patient will use his right arm as dominant with all functional and leisure activities without pain.     Time  8    Period  Weeks    Status  On-going      OT LONG TERM GOAL #2   Title  Patient will have 2/10 pain or less when lying on his right side.     Time  8    Period  Weeks    Status  On-going      OT LONG TERM GOAL #3   Title  Patient will improve right shoulder A/ROM to WNL for improved ability to reach overhead and scratch his back without pain.     Time  8    Period  Weeks    Status  On-going      OT LONG TERM GOAL #4   Title  Patient will have 5/5 strength in his right shoulder without pain in order to lift car parts in his shop.     Time  8    Period  Weeks    Status  On-going      OT LONG TERM GOAL #5   Title  Patient will decrease fascial restrictions to minimal or less in his right shoulder in order to  use his right arm with pain.      Time  8    Period  Weeks    Status  On-going            Plan - 09/15/18 1756    Clinical Impression Statement  A: Initiated myofascial release, manual stretching, A/ROM, and shoulder mobility exercises. patient required VC for form and technique. Patient presents with almost full ROM passively and actively with some muscle tightness at end  stretch.     Body Structure / Function / Physical Skills  Muscle spasms;ROM;Strength;UE functional use;Fascial restriction    Plan  P: Follow up on shoulder stretches. Add scapular strengthening with theraband.     Consulted and Agree with Plan of Care  Patient       Patient will benefit from skilled therapeutic intervention in order to improve the following deficits and impairments:  Body Structure / Function / Physical Skills  Visit Diagnosis: Acute pain of right shoulder  Stiffness of right shoulder, not elsewhere classified    Problem List Patient Active Problem List   Diagnosis Date Noted  . Angina pectoris ([place]) 08/19/2015  . Hyperglycemia 04/27/2014  . Gout   . Hypertension   . Cardiovascular disease 08/26/2009  . GASTROESOPHAGEAL REFLUX DISEASE 08/26/2009  . Hypercholesterolemia 01/15/2009  . RHINITIS, CHRONIC 01/15/2009   Ailene Ravel, OTR/L,CBIS  [phone]  09/15/2018, 5:59 PM  [facility] [address] [place], [place], [address] Phone: [phone]   Fax:  [phone]  Name: BRAIN HONEYCUTT MRN: [id number] Date of Birth: 12-Dec-1950

## 2018-09-16 ENCOUNTER — Encounter (HOSPITAL_COMMUNITY): Payer: Self-pay | Admitting: Occupational Therapy

## 2018-09-16 ENCOUNTER — Ambulatory Visit (HOSPITAL_COMMUNITY): Payer: Medicare HMO | Admitting: Occupational Therapy

## 2018-09-16 DIAGNOSIS — M25611 Stiffness of right shoulder, not elsewhere classified: Secondary | ICD-10-CM | POA: Diagnosis not present

## 2018-09-16 DIAGNOSIS — M25511 Pain in right shoulder: Secondary | ICD-10-CM

## 2018-09-16 NOTE — Therapy (Signed)
[place], [place], [address] Phone: [phone]   Fax:  [phone]  Occupational Therapy Treatment  Patient Details  Name: Dennis [name] MRN: [id number] Date of Birth: April 06, 1951 Referring Provider (OT): Dr. Nicholes Stairs   Encounter Date: 09/16/2018  OT End of Session - 09/16/18 0954    Visit Number  3    Number of Visits  16    Date for OT Re-Evaluation  11/04/18   mini reassess on 10/09/18   Authorization Type  [employer] Medicare, no visit limit     OT Start Time  1914    OT Stop Time  1952    OT Time Calculation (min)  38 min    Activity Tolerance  Patient tolerated treatment well    Behavior During Therapy  [facility] for tasks assessed/performed       Past Medical History:  Diagnosis Date  . Coronary artery disease    a. s/p DES to LAD with DES to RCA, and PTCA to D1 in 06/2007 b. 08/2017: cath showing patent stents with 70% D1, 50% LCx, 60% mid-RCA, and 60% Acute Mrg stenosis. Medical therapy recommended.   Marland Kitchen GERD (gastroesophageal reflux disease)   . Gout   . Hypercholesteremia   . Hypertension   . Myocardial infarction ([place]) 1998    Past Surgical History:  Procedure Laterality Date  . bilateral eye surgery    . CARDIAC CATHETERIZATION  2009   PCI with DES to LAD,PTCA to diagonal, and DES to RCA LV gram LVEF 60%   . CARDIAC SURGERY    . COLONOSCOPY    . COLONOSCOPY  05/14/2011   Procedure: COLONOSCOPY;  Surgeon: Daneil Dolin, MD;  [facility];  Service: Endoscopy;  Laterality: N/A;  8:15 AM  . LEFT HEART CATH AND CORONARY ANGIOGRAPHY N/A 08/14/2017   Procedure: LEFT HEART CATH AND CORONARY ANGIOGRAPHY;  Surgeon: Martinique, Peter M, MD;  Location: [place] [facility];  Service: Cardiovascular;  Laterality: N/A;    There were no vitals filed for this visit.  Subjective Assessment - 09/16/18 0912    Subjective   S: It was a little sore last time.    Currently in Pain?  Yes    Pain  Score  1     Pain Location  Shoulder    Pain Orientation  Right    Pain Descriptors / Indicators  Sore    Pain Type  Acute pain    Pain Radiating Towards  n/a    Pain Onset  In the past 7 days    Pain Frequency  Occasional    Aggravating Factors   movement    Pain Relieving Factors  rest    Effect of Pain on Daily Activities  min effect on ADLs    Multiple Pain Sites  No         [facility] OT Assessment - 09/16/18 0912      Assessment   Medical Diagnosis  Right Shoulder Bursitis      Precautions   Precautions  None               OT Treatments/Exercises (OP) - 09/16/18 0912      Exercises   Exercises  Shoulder      Shoulder Exercises: Supine   Protraction  PROM;5 reps;AROM;10 reps    Horizontal ABduction  PROM;5 reps;AROM;10 reps    External Rotation  PROM;5 reps;AROM;10 reps    Internal Rotation  PROM;5  reps;AROM;10 reps    Flexion  PROM;5 reps;AROM;10 reps    ABduction  PROM;5 reps;AROM;10 reps      Shoulder Exercises: Standing   Protraction  AROM;10 reps    Horizontal ABduction  AROM;10 reps    External Rotation  AROM;10 reps    Internal Rotation  AROM;10 reps    Flexion  AROM;10 reps    ABduction  AROM;10 reps    Extension  Theraband;10 reps    Theraband Level (Shoulder Extension)  Level 2 (Red)    Row  Theraband;10 reps    Theraband Level (Shoulder Row)  Level 2 (Red)    Retraction  Theraband;10 reps    Theraband Level (Shoulder Retraction)  Level 2 (Red)      Shoulder Exercises: ROM/Strengthening   UBE (Upper Arm Bike)  Level 1 2' forward 2' reverse    Wall Wash  1'    Proximal Shoulder Strengthening, Supine  10X each no rest breaks    Other ROM/Strengthening Exercises  Standing wall stretching with towel for shoulder flexion; 10X    Other ROM/Strengthening Exercises  ball pass behind back for IR using tennis ball, 10X      Shoulder Exercises: Stretch   Corner Stretch  3 reps;20 seconds    Internal Rotation Stretch  3 reps   20" horizontal    Wall Stretch - Flexion  3 reps;20 seconds    Wall Stretch - ABduction  3 reps;20 seconds      Manual Therapy   Manual Therapy  Myofascial release    Manual therapy comments  manual therapy completed seperately from all other interventions this date    Myofascial Release  myofascial release and manual stretching for right upper arm, scapular, and shoulder region to decrease pain and restrictions and improve pain free mobility in right shoulder region               OT Short Term Goals - 09/15/18 1148      OT SHORT TERM GOAL #1   Title  Patient will be educated on a HEP for improved right upper extremity use.     Time  4    Period  Weeks    Status  On-going    Target Date  10/07/18      OT SHORT TERM GOAL #2   Title  Patient will decrease pain in his right shoulder to 5/10 or less when threading belt through beltloops.     Time  4    Period  Weeks    Status  On-going      OT SHORT TERM GOAL #3   Title  Patient will improve right shoulder A/ROM to [facility] for improved ability to reach overhead and behind his back.     Time  4    Period  Weeks    Status  On-going      OT SHORT TERM GOAL #4   Title  Patient will decrease fascial restrictions from moderate to min-moderate for improved functional use of his right arm with less pain.     Time  4    Status  On-going        OT Long Term Goals - 09/15/18 1148      OT LONG TERM GOAL #1   Title  Patient will use his right arm as dominant with all functional and leisure activities without pain.     Time  8    Period  Weeks    Status  On-going  OT LONG TERM GOAL #2   Title  Patient will have 2/10 pain or less when lying on his right side.     Time  8    Period  Weeks    Status  On-going      OT LONG TERM GOAL #3   Title  Patient will improve right shoulder A/ROM to WNL for improved ability to reach overhead and scratch his back without pain.     Time  8    Period  Weeks    Status  On-going      OT LONG TERM GOAL #4    Title  Patient will have 5/5 strength in his right shoulder without pain in order to lift car parts in his shop.     Time  8    Period  Weeks    Status  On-going      OT LONG TERM GOAL #5   Title  Patient will decrease fascial restrictions to minimal or less in his right shoulder in order to use his right arm with pain.      Time  8    Period  Weeks    Status  On-going            Plan - 09/16/18 0955    Clinical Impression Statement  A: Manual therapy completed this session to address fascial restrictions in RUE causing pain and limiting ROM. Continued with A/ROM, adding proximal shoulder strengthening and scapular theraband exercises this session. Also completed shoulder stretches working on correct form. Pt able to achieve A/ROM [facility] today. Verbal cuing for form and technique.    Body Structure / Function / Physical Skills  ADL;UE functional use;Fascial restriction;Pain;ROM;IADL;Strength    OT Treatment/Interventions  Self-care/ADL training;Ultrasound;Patient/family education;Passive range of motion;Cryotherapy;Electrical Stimulation;Moist Heat;Therapeutic exercise;Manual Therapy;Therapeutic activities    Plan  P: Continue with A/ROM and add to HEP. Continue working towards improved form with scapular theraband. Add x to v arms       Patient will benefit from skilled therapeutic intervention in order to improve the following deficits and impairments:  Body Structure / Function / Physical Skills  Visit Diagnosis: Acute pain of right shoulder  Stiffness of right shoulder, not elsewhere classified    Problem List Patient Active Problem List   Diagnosis Date Noted  . Angina pectoris ([place]) 08/19/2015  . Hyperglycemia 04/27/2014  . Gout   . Hypertension   . Cardiovascular disease 08/26/2009  . GASTROESOPHAGEAL REFLUX DISEASE 08/26/2009  . Hypercholesterolemia 01/15/2009  . RHINITIS, CHRONIC 01/15/2009   Guadelupe Sabin, OTR/L  [phone] 09/16/2018, 9:58 AM  [place] [address] [place], [place], [address] Phone: [phone]   Fax:  [phone]  Name: DENT PLANTZ MRN: [id number] Date of Birth: 1950/11/16

## 2018-09-17 DIAGNOSIS — M7551 Bursitis of right shoulder: Secondary | ICD-10-CM | POA: Diagnosis not present

## 2018-09-22 ENCOUNTER — Ambulatory Visit (HOSPITAL_COMMUNITY): Payer: Medicare HMO

## 2018-09-22 ENCOUNTER — Other Ambulatory Visit: Payer: Self-pay

## 2018-09-22 ENCOUNTER — Encounter (HOSPITAL_COMMUNITY): Payer: Self-pay

## 2018-09-22 DIAGNOSIS — M25511 Pain in right shoulder: Secondary | ICD-10-CM | POA: Diagnosis not present

## 2018-09-22 DIAGNOSIS — M25611 Stiffness of right shoulder, not elsewhere classified: Secondary | ICD-10-CM | POA: Diagnosis not present

## 2018-09-22 NOTE — Therapy (Signed)
[place], [place], [address] Phone: [phone]   Fax:  [phone]  Occupational Therapy Treatment  Patient Details  Name: Dennis [name] MRN: [id number] Date of Birth: [birth year] Referring Provider (OT): Dr. Nicholes Stairs   Encounter Date: 09/22/2018  OT End of Session - 09/22/18 1512    Visit Number  4    Number of Visits  16    Date for OT Re-Evaluation  11/04/18   mini reassess on 10/09/18   Authorization Type  [employer] Medicare, no visit limit     OT Start Time  1435    OT Stop Time  1515    OT Time Calculation (min)  40 min    Activity Tolerance  Patient tolerated treatment well    Behavior During Therapy  [facility] for tasks assessed/performed       Past Medical History:  Diagnosis Date  . Coronary artery disease    a. s/p DES to LAD with DES to RCA, and PTCA to D1 in 06/2007 b. 08/2017: cath showing patent stents with 70% D1, 50% LCx, 60% mid-RCA, and 60% Acute Mrg stenosis. Medical therapy recommended.   Marland Kitchen GERD (gastroesophageal reflux disease)   . Gout   . Hypercholesteremia   . Hypertension   . Myocardial infarction ([place]) 1998    Past Surgical History:  Procedure Laterality Date  . bilateral eye surgery    . CARDIAC CATHETERIZATION  2009   PCI with DES to LAD,PTCA to diagonal, and DES to RCA LV gram LVEF 60%   . CARDIAC SURGERY    . COLONOSCOPY    . COLONOSCOPY  05/14/2011   Procedure: COLONOSCOPY;  Surgeon: Daneil Dolin, MD;  [facility];  Service: Endoscopy;  Laterality: N/A;  8:15 AM  . LEFT HEART CATH AND CORONARY ANGIOGRAPHY N/A 08/14/2017   Procedure: LEFT HEART CATH AND CORONARY ANGIOGRAPHY;  Surgeon: Martinique, Peter M, MD;  Location: [place] [facility];  Service: Cardiovascular;  Laterality: N/A;    There were no vitals filed for this visit.  Subjective Assessment - 09/22/18 1448    Subjective   S: I don't know if I'm over doing it but I try to do as many exercises as  I can remember from here.    Currently in Pain?  Yes    Pain Score  5     Pain Location  Shoulder    Pain Orientation  Right    Pain Descriptors / Indicators  Sore    Pain Type  Acute pain         [facility] OT Assessment - 09/22/18 1450      Assessment   Medical Diagnosis  Right Shoulder Bursitis      Precautions   Precautions  None               OT Treatments/Exercises (OP) - 09/22/18 1450      Exercises   Exercises  Shoulder      Shoulder Exercises: Standing   Protraction  AROM;12 reps    Horizontal ABduction  AROM;12 reps    External Rotation  AROM;12 reps    Internal Rotation  AROM;12 reps    Flexion  AROM;12 reps    ABduction  AROM;12 reps    Extension  Theraband;10 reps    Theraband Level (Shoulder Extension)  Level 2 (Red)    Row  Theraband;10 reps    Theraband Level (Shoulder Row)  Level 3 (Green)  Retraction  Theraband;10 reps    Theraband Level (Shoulder Retraction)  Level 2 (Red)      Shoulder Exercises: ROM/Strengthening   UBE (Upper Arm Bike)  Level 3 2' forward 2' reverse    pace: 5.0-6.0   Over Head Lace  2' seated with 1# wrist weight    X to V Arms  10X    Proximal Shoulder Strengthening, Supine  12X each no rest breaks    Other ROM/Strengthening Exercises  Standing wall stretching with towel for shoulder flexion; 12X    Other ROM/Strengthening Exercises  ball pass behind back for IR using tennis ball, 10X      Manual Therapy   Manual Therapy  Myofascial release    Manual therapy comments  manual therapy completed seperately from all other interventions this date    Myofascial Release  myofascial release and manual stretching for right upper arm, scapular, and shoulder region to decrease pain and restrictions and improve pain free mobility in right shoulder region               OT Short Term Goals - 09/15/18 1148      OT SHORT TERM GOAL #1   Title  Patient will be educated on a HEP for improved right upper extremity use.      Time  4    Period  Weeks    Status  On-going    Target Date  10/07/18      OT SHORT TERM GOAL #2   Title  Patient will decrease pain in his right shoulder to 5/10 or less when threading belt through beltloops.     Time  4    Period  Weeks    Status  On-going      OT SHORT TERM GOAL #3   Title  Patient will improve right shoulder A/ROM to [facility] for improved ability to reach overhead and behind his back.     Time  4    Period  Weeks    Status  On-going      OT SHORT TERM GOAL #4   Title  Patient will decrease fascial restrictions from moderate to min-moderate for improved functional use of his right arm with less pain.     Time  4    Status  On-going        OT Long Term Goals - 09/15/18 1148      OT LONG TERM GOAL #1   Title  Patient will use his right arm as dominant with all functional and leisure activities without pain.     Time  8    Period  Weeks    Status  On-going      OT LONG TERM GOAL #2   Title  Patient will have 2/10 pain or less when lying on his right side.     Time  8    Period  Weeks    Status  On-going      OT LONG TERM GOAL #3   Title  Patient will improve right shoulder A/ROM to WNL for improved ability to reach overhead and scratch his back without pain.     Time  8    Period  Weeks    Status  On-going      OT LONG TERM GOAL #4   Title  Patient will have 5/5 strength in his right shoulder without pain in order to lift car parts in his shop.     Time  8  Period  Weeks    Status  On-going      OT LONG TERM GOAL #5   Title  Patient will decrease fascial restrictions to minimal or less in his right shoulder in order to use his right arm with pain.      Time  8    Period  Weeks    Status  On-going            Plan - 09/22/18 1513    Clinical Impression Statement  A: Pt required VC for form and technique during session especially related to speed and refraining of using momentum. Increased repetitions for A/ROM. Fascial restrictions only  noted along upper trapezius region with manual therapy techniques completed to address.    Body Structure / Function / Physical Skills  ADL;UE functional use;Fascial restriction;Pain;ROM;IADL;Strength    Plan  P: Start strenthening supine and standing with hand weights (1-2lb). Focus on decreasing speed and momentum when going through the movements.    Consulted and Agree with Plan of Care  Patient       Patient will benefit from skilled therapeutic intervention in order to improve the following deficits and impairments:   Body Structure / Function / Physical Skills: ADL, UE functional use, Fascial restriction, Pain, ROM, IADL, Strength       Visit Diagnosis: 1. Acute pain of right shoulder   2. Stiffness of right shoulder, not elsewhere classified       Problem List Patient Active Problem List   Diagnosis Date Noted  . Angina pectoris ([place]) 08/19/2015  . Hyperglycemia 04/27/2014  . Gout   . Hypertension   . Cardiovascular disease 08/26/2009  . GASTROESOPHAGEAL REFLUX DISEASE 08/26/2009  . Hypercholesterolemia 01/15/2009  . RHINITIS, CHRONIC 01/15/2009   Ailene Ravel, OTR/L,CBIS  [phone]  09/22/2018, 3:58 PM  [place] [address] [place], [place], [address] Phone: [phone]   Fax:  [phone]  Name: OBINNA EHRESMAN MRN: [id number] Date of Birth: 1951-01-10

## 2018-09-24 ENCOUNTER — Ambulatory Visit (HOSPITAL_COMMUNITY): Payer: Medicare HMO | Admitting: Occupational Therapy

## 2018-09-24 ENCOUNTER — Other Ambulatory Visit: Payer: Self-pay

## 2018-09-24 ENCOUNTER — Encounter (HOSPITAL_COMMUNITY): Payer: Self-pay | Admitting: Occupational Therapy

## 2018-09-24 DIAGNOSIS — M25611 Stiffness of right shoulder, not elsewhere classified: Secondary | ICD-10-CM

## 2018-09-24 DIAGNOSIS — D519 Vitamin B12 deficiency anemia, unspecified: Secondary | ICD-10-CM | POA: Diagnosis not present

## 2018-09-24 DIAGNOSIS — M25511 Pain in right shoulder: Secondary | ICD-10-CM

## 2018-09-24 NOTE — Therapy (Signed)
[place], [place], [address] Phone: [phone]   Fax:  [phone]  Occupational Therapy Treatment  Patient Details  Name: Dennis [name] MRN: [id number] Date of Birth: 23-Sep-1950 Referring Provider (OT): Dr. Nicholes Stairs   Encounter Date: 09/24/2018  OT End of Session - 09/24/18 1505    Visit Number  5    Number of Visits  16    Date for OT Re-Evaluation  11/04/18   mini reassess on 10/09/18   Authorization Type  [employer] Medicare, no visit limit     OT Start Time  1417    OT Stop Time  1456    OT Time Calculation (min)  39 min    Activity Tolerance  Patient tolerated treatment well    Behavior During Therapy  [facility] for tasks assessed/performed       Past Medical History:  Diagnosis Date  . Coronary artery disease    a. s/p DES to LAD with DES to RCA, and PTCA to D1 in 06/2007 b. 08/2017: cath showing patent stents with 70% D1, 50% LCx, 60% mid-RCA, and 60% Acute Mrg stenosis. Medical therapy recommended.   Marland Kitchen GERD (gastroesophageal reflux disease)   . Gout   . Hypercholesteremia   . Hypertension   . Myocardial infarction ([place]) 1998    Past Surgical History:  Procedure Laterality Date  . bilateral eye surgery    . CARDIAC CATHETERIZATION  2009   PCI with DES to LAD,PTCA to diagonal, and DES to RCA LV gram LVEF 60%   . CARDIAC SURGERY    . COLONOSCOPY    . COLONOSCOPY  05/14/2011   Procedure: COLONOSCOPY;  Surgeon: Daneil Dolin, MD;  [facility];  Service: Endoscopy;  Laterality: N/A;  8:15 AM  . LEFT HEART CATH AND CORONARY ANGIOGRAPHY N/A 08/14/2017   Procedure: LEFT HEART CATH AND CORONARY ANGIOGRAPHY;  Surgeon: Martinique, Peter M, MD;  Location: [place] [facility];  Service: Cardiovascular;  Laterality: N/A;    There were no vitals filed for this visit.  Subjective Assessment - 09/24/18 1416    Subjective   S: It's easier to reach for things and buckle my seat belt.    Currently  in Pain?  No/denies         [facility] OT Assessment - 09/24/18 1416      Assessment   Medical Diagnosis  Right Shoulder Bursitis      Precautions   Precautions  None               OT Treatments/Exercises (OP) - 09/24/18 1419      Exercises   Exercises  Shoulder      Shoulder Exercises: Supine   Protraction  PROM;5 reps;Strengthening;10 reps    Protraction Weight (lbs)  2    Horizontal ABduction  PROM;5 reps;Strengthening;10 reps    Horizontal ABduction Weight (lbs)  2    External Rotation  PROM;5 reps;Strengthening;10 reps    External Rotation Weight (lbs)  2    Internal Rotation  PROM;5 reps;Strengthening;10 reps    Internal Rotation Weight (lbs)  2    Flexion  PROM;5 reps;Strengthening;10 reps    Shoulder Flexion Weight (lbs)  2    ABduction  PROM;5 reps;Strengthening;10 reps    Shoulder ABduction Weight (lbs)  2      Shoulder Exercises: Standing   Protraction  Strengthening;10 reps    Protraction Weight (lbs)  2    Horizontal ABduction  Strengthening;10 reps    Horizontal ABduction Weight (lbs)  2    External Rotation  Strengthening;10 reps    External Rotation Weight (lbs)  2    Internal Rotation  Strengthening;10 reps    Internal Rotation Weight (lbs)  2    Flexion  Strengthening;10 reps    Shoulder Flexion Weight (lbs)  2    ABduction  Strengthening;10 reps    Shoulder ABduction Weight (lbs)  2    Extension  Theraband;10 reps    Theraband Level (Shoulder Extension)  Level 2 (Red)    Row  Theraband;10 reps    Theraband Level (Shoulder Row)  Level 3 (Green)    Retraction  Theraband;10 reps    Theraband Level (Shoulder Retraction)  Level 2 (Red)      Shoulder Exercises: ROM/Strengthening   UBE (Upper Arm Bike)  Level 2, 3' forward 3' reverse    Over Head Lace  2' seated with 2# wrist weight    X to V Arms  10X, 1#    Proximal Shoulder Strengthening, Supine  12X each, 2# no rest breaks    Proximal Shoulder Strengthening, Seated  10X each, 2# weight, no  rest breaks    Ball on Wall  1' flexion, 1' abduction    Other ROM/Strengthening Exercises  Y lift off, 10X    Other ROM/Strengthening Exercises  ball pass behind back for IR using tennis ball, 15X      Manual Therapy   Manual Therapy  Myofascial release    Manual therapy comments  manual therapy completed seperately from all other interventions this date    Myofascial Release  myofascial release and manual stretching for right upper arm, scapular, and shoulder region to decrease pain and restrictions and improve pain free mobility in right shoulder region             OT Education - 09/24/18 1505    Education Details  red scapular theraband    Person(s) Educated  Patient    Methods  Explanation;Demonstration;Handout    Comprehension  Returned demonstration;Verbalized understanding       OT Short Term Goals - 09/15/18 1148      OT SHORT TERM GOAL #1   Title  Patient will be educated on a HEP for improved right upper extremity use.     Time  4    Period  Weeks    Status  On-going    Target Date  10/07/18      OT SHORT TERM GOAL #2   Title  Patient will decrease pain in his right shoulder to 5/10 or less when threading belt through beltloops.     Time  4    Period  Weeks    Status  On-going      OT SHORT TERM GOAL #3   Title  Patient will improve right shoulder A/ROM to [facility] for improved ability to reach overhead and behind his back.     Time  4    Period  Weeks    Status  On-going      OT SHORT TERM GOAL #4   Title  Patient will decrease fascial restrictions from moderate to min-moderate for improved functional use of his right arm with less pain.     Time  4    Status  On-going        OT Long Term Goals - 09/15/18 1148      OT LONG TERM GOAL #1   Title  Patient will use  his right arm as dominant with all functional and leisure activities without pain.     Time  8    Period  Weeks    Status  On-going      OT LONG TERM GOAL #2   Title  Patient will have  2/10 pain or less when lying on his right side.     Time  8    Period  Weeks    Status  On-going      OT LONG TERM GOAL #3   Title  Patient will improve right shoulder A/ROM to WNL for improved ability to reach overhead and scratch his back without pain.     Time  8    Period  Weeks    Status  On-going      OT LONG TERM GOAL #4   Title  Patient will have 5/5 strength in his right shoulder without pain in order to lift car parts in his shop.     Time  8    Period  Weeks    Status  On-going      OT LONG TERM GOAL #5   Title  Patient will decrease fascial restrictions to minimal or less in his right shoulder in order to use his right arm with pain.      Time  8    Period  Weeks    Status  On-going            Plan - 09/24/18 1505    Clinical Impression Statement  A: Pt reports he is noticing improvements in his RUE use and reaching ability during ADLs. Continued with ROM and strengthening exercises. Added 2# weights to exercises today, as well as 2# wrist weight to overhead lacing. Added Y lift off, ball on wall, and proximal shoulder strengthening in standing. Verbal cuing for form and technique during exercises, min fatigue at end of session.    Body Structure / Function / Physical Skills  ADL;UE functional use;Fascial restriction;Pain;ROM;IADL;Strength    Plan  P: Continue with strengthening exercises, add therapy ball exercises       Patient will benefit from skilled therapeutic intervention in order to improve the following deficits and impairments:   Body Structure / Function / Physical Skills: ADL, UE functional use, Fascial restriction, Pain, ROM, IADL, Strength       Visit Diagnosis: 1. Acute pain of right shoulder   2. Stiffness of right shoulder, not elsewhere classified       Problem List Patient Active Problem List   Diagnosis Date Noted  . Angina pectoris ([place]) 08/19/2015  . Hyperglycemia 04/27/2014  . Gout   . Hypertension   . Cardiovascular disease  08/26/2009  . GASTROESOPHAGEAL REFLUX DISEASE 08/26/2009  . Hypercholesterolemia 01/15/2009  . RHINITIS, CHRONIC 01/15/2009   Guadelupe Sabin, OTR/L  [phone] 09/24/2018, 3:08 PM  [place] [address] [place], [place], [address] Phone: [phone]   Fax:  [phone]  Name: Dennis [name] MRN: [id number] Date of Birth: Jul 13, 1950

## 2018-09-24 NOTE — Patient Instructions (Signed)

## 2018-09-29 ENCOUNTER — Encounter (HOSPITAL_COMMUNITY): Payer: Self-pay | Admitting: Specialist

## 2018-09-29 ENCOUNTER — Ambulatory Visit (HOSPITAL_COMMUNITY): Payer: Medicare HMO | Admitting: Specialist

## 2018-09-29 ENCOUNTER — Other Ambulatory Visit: Payer: Self-pay

## 2018-09-29 DIAGNOSIS — M25611 Stiffness of right shoulder, not elsewhere classified: Secondary | ICD-10-CM | POA: Diagnosis not present

## 2018-09-29 DIAGNOSIS — M25511 Pain in right shoulder: Secondary | ICD-10-CM

## 2018-09-29 NOTE — Therapy (Signed)
[place], [place], [address] Phone: [phone]   Fax:  [phone]  Occupational Therapy Treatment  Patient Details  Name: Dennis [name] MRN: [id number] Date of Birth: 20-Sep-1950 Referring Provider (OT): Dr. Nicholes Stairs   Encounter Date: 09/29/2018  OT End of Session - 09/29/18 1032    Visit Number  6    Number of Visits  16    Date for OT Re-Evaluation  11/04/18   mini reassess on 10/09/18   Authorization Type  [employer] Medicare, no visit limit     OT Start Time  0915    OT Stop Time  1000    OT Time Calculation (min)  45 min    Activity Tolerance  Patient tolerated treatment well    Behavior During Therapy  [facility] for tasks assessed/performed       Past Medical History:  Diagnosis Date  . Coronary artery disease    a. s/p DES to LAD with DES to RCA, and PTCA to D1 in 06/2007 b. 08/2017: cath showing patent stents with 70% D1, 50% LCx, 60% mid-RCA, and 60% Acute Mrg stenosis. Medical therapy recommended.   Marland Kitchen GERD (gastroesophageal reflux disease)   . Gout   . Hypercholesteremia   . Hypertension   . Myocardial infarction ([place]) 1998    Past Surgical History:  Procedure Laterality Date  . bilateral eye surgery    . CARDIAC CATHETERIZATION  2009   PCI with DES to LAD,PTCA to diagonal, and DES to RCA LV gram LVEF 60%   . CARDIAC SURGERY    . COLONOSCOPY    . COLONOSCOPY  05/14/2011   Procedure: COLONOSCOPY;  Surgeon: Daneil Dolin, MD;  [facility];  Service: Endoscopy;  Laterality: N/A;  8:15 AM  . LEFT HEART CATH AND CORONARY ANGIOGRAPHY N/A 08/14/2017   Procedure: LEFT HEART CATH AND CORONARY ANGIOGRAPHY;  Surgeon: Martinique, Peter M, MD;  Location: [place] [facility];  Service: Cardiovascular;  Laterality: N/A;    There were no vitals filed for this visit.  Subjective Assessment - 09/29/18 1032    Subjective   S:  Its been a little sore.  I have been working on building a fence,  which may be contributing to that soreness.    Currently in Pain?  Yes    Pain Score  2     Pain Location  Shoulder    Pain Orientation  Right    Pain Descriptors / Indicators  Aching         [facility] OT Assessment - 09/29/18 0001      Assessment   Medical Diagnosis  Right Shoulder Bursitis      Precautions   Precautions  None               OT Treatments/Exercises (OP) - 09/29/18 0001      Shoulder Exercises: Supine   Protraction  PROM;5 reps;Strengthening;15 reps    Protraction Weight (lbs)  2    Horizontal ABduction  PROM;5 reps;Strengthening;15 reps    Horizontal ABduction Weight (lbs)  2    External Rotation  PROM;5 reps;Strengthening;15 reps    External Rotation Weight (lbs)  2    Internal Rotation  PROM;5 reps;Strengthening;15 reps    Internal Rotation Weight (lbs)  2    Flexion  PROM;5 reps;Strengthening;15 reps    Shoulder Flexion Weight (lbs)  2    ABduction  PROM;5 reps;Strengthening;15 reps    Shoulder ABduction Weight (  lbs)  2      Shoulder Exercises: Standing   Protraction  Strengthening;15 reps    Protraction Weight (lbs)  2    Horizontal ABduction  Strengthening;15 reps    Horizontal ABduction Weight (lbs)  2    External Rotation  Strengthening;Theraband;15 reps    Theraband Level (Shoulder External Rotation)  Level 3 (Green)    External Rotation Weight (lbs)  2    Internal Rotation  Strengthening;Theraband;15 reps    Theraband Level (Shoulder Internal Rotation)  Level 3 (Green)    Internal Rotation Weight (lbs)  2    Flexion  Strengthening;15 reps    Shoulder Flexion Weight (lbs)  2    ABduction  Strengthening;5 reps;AROM;10 reps    Shoulder ABduction Weight (lbs)  2    Extension  Theraband;15 reps    Theraband Level (Shoulder Extension)  Level 3 (Green)    Retraction  Theraband;15 reps    Theraband Level (Shoulder Retraction)  Level 3 (Green)      Shoulder Exercises: Therapy Ball   Other Therapy Ball Exercises  overhead press, flexion,  overhead v, chest press 10 times each       Shoulder Exercises: ROM/Strengthening   UBE (Upper Arm Bike)  Level 3, 3' forward 3' reverse    Ball on Wall  1' flexion, 1' abduction attempted 1' able to compelte 30 seconds only      Manual Therapy   Manual Therapy  Myofascial release    Manual therapy comments  manual therapy completed seperately from all other interventions this date    Myofascial Release  myofascial release and manual stretching for right upper arm, scapular, and shoulder region to decrease pain and restrictions and improve pain free mobility in right shoulder region               OT Short Term Goals - 09/29/18 1035      OT SHORT TERM GOAL #1   Title  Patient will be educated on a HEP for improved right upper extremity use.     Time  4    Period  Weeks    Status  On-going    Target Date  10/07/18      OT SHORT TERM GOAL #2   Title  Patient will decrease pain in his right shoulder to 5/10 or less when threading belt through beltloops.     Time  4    Period  Weeks    Status  On-going      OT SHORT TERM GOAL #3   Title  Patient will improve right shoulder A/ROM to [facility] for improved ability to reach overhead and behind his back.     Time  4    Period  Weeks    Status  On-going      OT SHORT TERM GOAL #4   Title  Patient will decrease fascial restrictions from moderate to min-moderate for improved functional use of his right arm with less pain.     Time  4    Status  On-going        OT Long Term Goals - 09/29/18 1035      OT LONG TERM GOAL #1   Title  Patient will use his right arm as dominant with all functional and leisure activities without pain.     Time  8    Period  Weeks    Status  On-going      OT LONG TERM GOAL #2   Title  Patient will have 2/10 pain or less when lying on his right side.     Time  8    Period  Weeks    Status  On-going      OT LONG TERM GOAL #3   Title  Patient will improve right shoulder A/ROM to WNL for improved  ability to reach overhead and scratch his back without pain.     Time  8    Period  Weeks    Status  On-going      OT LONG TERM GOAL #4   Title  Patient will have 5/5 strength in his right shoulder without pain in order to lift car parts in his shop.     Time  8    Period  Weeks    Status  On-going      OT LONG TERM GOAL #5   Title  Patient will decrease fascial restrictions to minimal or less in his right shoulder in order to use his right arm with pain.      Time  8    Period  Weeks    Status  On-going            Plan - 09/29/18 1033    Clinical Impression Statement  A:  slight increase in soreness today, most likely due to increased amount of functional activity over the past several days.  exercises that involve abduction are most painful and difficult for patient to complete, requiring frequent rest breaks during these tasks.    Body Structure / Function / Physical Skills  ADL;UE functional use;Fascial restriction;Pain;ROM;IADL;Strength    Plan  P:  Attempt abduction exercises with less rest breaks, increase repetitions with therapy ball exercises, add prone strengthening.       Patient will benefit from skilled therapeutic intervention in order to improve the following deficits and impairments:   Body Structure / Function / Physical Skills: ADL, UE functional use, Fascial restriction, Pain, ROM, IADL, Strength       Visit Diagnosis: 1. Acute pain of right shoulder   2. Stiffness of right shoulder, not elsewhere classified       Problem List Patient Active Problem List   Diagnosis Date Noted  . Angina pectoris ([place]) 08/19/2015  . Hyperglycemia 04/27/2014  . Gout   . Hypertension   . Cardiovascular disease 08/26/2009  . GASTROESOPHAGEAL REFLUX DISEASE 08/26/2009  . Hypercholesterolemia 01/15/2009  . RHINITIS, CHRONIC 01/15/2009    Vangie Bicker, Lawson, OTR/L [phone]  09/29/2018, 10:40 AM  [place], [place], [address] Phone: [phone]   Fax:  [phone]  Name: Dennis [name] MRN: [id number] Date of Birth: 02-25-1951

## 2018-10-01 ENCOUNTER — Other Ambulatory Visit: Payer: Self-pay

## 2018-10-01 ENCOUNTER — Ambulatory Visit (HOSPITAL_COMMUNITY): Payer: Medicare HMO | Admitting: Occupational Therapy

## 2018-10-01 ENCOUNTER — Encounter (HOSPITAL_COMMUNITY): Payer: Self-pay | Admitting: Occupational Therapy

## 2018-10-01 DIAGNOSIS — M25511 Pain in right shoulder: Secondary | ICD-10-CM | POA: Diagnosis not present

## 2018-10-01 DIAGNOSIS — M25611 Stiffness of right shoulder, not elsewhere classified: Secondary | ICD-10-CM

## 2018-10-01 NOTE — Therapy (Signed)
[place], [place], [address] Phone: [phone]   Fax:  [phone]  Occupational Therapy Treatment  Patient Details  Name: Dennis [name] MRN: [id number] Date of Birth: 07/30/1950 Referring Provider (OT): Dr. Nicholes Stairs   Encounter Date: 10/01/2018  OT End of Session - 10/01/18 1454    Visit Number  7    Number of Visits  16    Date for OT Re-Evaluation  11/04/18   mini reassess on 10/09/18   Authorization Type  [employer] Medicare, no visit limit     OT Start Time  1405    OT Stop Time  1445    OT Time Calculation (min)  40 min    Activity Tolerance  Patient tolerated treatment well    Behavior During Therapy  [facility] for tasks assessed/performed       Past Medical History:  Diagnosis Date  . Coronary artery disease    a. s/p DES to LAD with DES to RCA, and PTCA to D1 in 06/2007 b. 08/2017: cath showing patent stents with 70% D1, 50% LCx, 60% mid-RCA, and 60% Acute Mrg stenosis. Medical therapy recommended.   Marland Kitchen GERD (gastroesophageal reflux disease)   . Gout   . Hypercholesteremia   . Hypertension   . Myocardial infarction ([place]) 1998    Past Surgical History:  Procedure Laterality Date  . bilateral eye surgery    . CARDIAC CATHETERIZATION  2009   PCI with DES to LAD,PTCA to diagonal, and DES to RCA LV gram LVEF 60%   . CARDIAC SURGERY    . COLONOSCOPY    . COLONOSCOPY  05/14/2011   Procedure: COLONOSCOPY;  Surgeon: Daneil Dolin, MD;  [facility];  Service: Endoscopy;  Laterality: N/A;  8:15 AM  . LEFT HEART CATH AND CORONARY ANGIOGRAPHY N/A 08/14/2017   Procedure: LEFT HEART CATH AND CORONARY ANGIOGRAPHY;  Surgeon: Martinique, Peter M, MD;  Location: [place] [facility];  Service: Cardiovascular;  Laterality: N/A;    There were no vitals filed for this visit.  Subjective Assessment - 10/01/18 1405    Subjective   S: I took some tylenol earlier so I don't have any pain now.    Currently in Pain?  No/denies         [facility] OT Assessment - 10/01/18 1405      Assessment   Medical Diagnosis  Right Shoulder Bursitis      Precautions   Precautions  None               OT Treatments/Exercises (OP) - 10/01/18 1409      Exercises   Exercises  Shoulder      Shoulder Exercises: Supine   Protraction  PROM;5 reps;Strengthening;15 reps    Protraction Weight (lbs)  2    Horizontal ABduction  PROM;5 reps;Strengthening;15 reps    Horizontal ABduction Weight (lbs)  2    External Rotation  PROM;5 reps;Strengthening;15 reps    External Rotation Weight (lbs)  2    Internal Rotation  PROM;5 reps;Strengthening;15 reps    Internal Rotation Weight (lbs)  2    Flexion  PROM;5 reps;Strengthening;15 reps    Shoulder Flexion Weight (lbs)  2    ABduction  PROM;5 reps;AROM;15 reps      Shoulder Exercises: Prone   Flexion  Strengthening;10 reps    Flexion Weight (lbs)  1    Extension  Strengthening;10 reps    Extension Weight (lbs)  1  Horizontal ABduction 1  Strengthening;10 reps    Horizontal ABduction 1 Weight (lbs)  1    Other Prone Exercises  scapular retraction, 1#, 10X      Shoulder Exercises: Standing   Protraction  Strengthening;15 reps    Protraction Weight (lbs)  2    Horizontal ABduction  Strengthening;15 reps    Horizontal ABduction Weight (lbs)  2    External Rotation  Strengthening;Theraband;15 reps    Theraband Level (Shoulder External Rotation)  Level 3 (Green)    External Rotation Weight (lbs)  2    Internal Rotation  Strengthening;Theraband;15 reps    Theraband Level (Shoulder Internal Rotation)  Level 3 (Green)    Internal Rotation Weight (lbs)  2    Flexion  Strengthening;15 reps    Shoulder Flexion Weight (lbs)  2    ABduction  Strengthening;5 reps;AROM;10 reps    Shoulder ABduction Weight (lbs)  2    Extension  Theraband;15 reps    Theraband Level (Shoulder Extension)  Level 3 (Green)    Retraction  Theraband;15 reps    Theraband  Level (Shoulder Retraction)  Level 3 (Green)      Shoulder Exercises: Therapy Ball   Other Therapy Ball Exercises  green therapy ball: chest press, overhead press, flexion, circles each direction, 12X each      Shoulder Exercises: ROM/Strengthening   X to V Arms  10X, 2#    Proximal Shoulder Strengthening, Supine  12X each, 2# no rest breaks    Ball on Wall  1' flexion 1' abduction    Other ROM/Strengthening Exercises  Y lift off, 10X      Manual Therapy   Manual Therapy  Myofascial release    Manual therapy comments  manual therapy completed seperately from all other interventions this date    Myofascial Release  myofascial release and manual stretching for right upper arm, scapular, and shoulder region to decrease pain and restrictions and improve pain free mobility in right shoulder region               OT Short Term Goals - 09/29/18 1035      OT SHORT TERM GOAL #1   Title  Patient will be educated on a HEP for improved right upper extremity use.     Time  4    Period  Weeks    Status  On-going    Target Date  10/07/18      OT SHORT TERM GOAL #2   Title  Patient will decrease pain in his right shoulder to 5/10 or less when threading belt through beltloops.     Time  4    Period  Weeks    Status  On-going      OT SHORT TERM GOAL #3   Title  Patient will improve right shoulder A/ROM to [facility] for improved ability to reach overhead and behind his back.     Time  4    Period  Weeks    Status  On-going      OT SHORT TERM GOAL #4   Title  Patient will decrease fascial restrictions from moderate to min-moderate for improved functional use of his right arm with less pain.     Time  4    Status  On-going        OT Long Term Goals - 09/29/18 1035      OT LONG TERM GOAL #1   Title  Patient will use his right arm as dominant with all  functional and leisure activities without pain.     Time  8    Period  Weeks    Status  On-going      OT LONG TERM GOAL #2   Title   Patient will have 2/10 pain or less when lying on his right side.     Time  8    Period  Weeks    Status  On-going      OT LONG TERM GOAL #3   Title  Patient will improve right shoulder A/ROM to WNL for improved ability to reach overhead and scratch his back without pain.     Time  8    Period  Weeks    Status  On-going      OT LONG TERM GOAL #4   Title  Patient will have 5/5 strength in his right shoulder without pain in order to lift car parts in his shop.     Time  8    Period  Weeks    Status  On-going      OT LONG TERM GOAL #5   Title  Patient will decrease fascial restrictions to minimal or less in his right shoulder in order to use his right arm with pain.      Time  8    Period  Weeks    Status  On-going            Plan - 10/01/18 1455    Clinical Impression Statement  A: Continued with strengthening this session, added prone exercises using 1# weight. Pt unable to complete abduction with weight in supine, however was able in standing. Increased therapy ball repetitions and added circles. Verbal cuing for form, technique, and to not use momentum.    Body Structure / Function / Physical Skills  ADL;UE functional use;Fascial restriction;Pain;ROM;IADL;Strength    Plan  P: Add wall push ups, continue with prone strengthening.       Patient will benefit from skilled therapeutic intervention in order to improve the following deficits and impairments:   Body Structure / Function / Physical Skills: ADL, UE functional use, Fascial restriction, Pain, ROM, IADL, Strength       Visit Diagnosis: 1. Acute pain of right shoulder   2. Stiffness of right shoulder, not elsewhere classified       Problem List Patient Active Problem List   Diagnosis Date Noted  . Angina pectoris ([place]) 08/19/2015  . Hyperglycemia 04/27/2014  . Gout   . Hypertension   . Cardiovascular disease 08/26/2009  . GASTROESOPHAGEAL REFLUX DISEASE 08/26/2009  . Hypercholesterolemia 01/15/2009  .  RHINITIS, CHRONIC 01/15/2009   Guadelupe Sabin, OTR/L  [phone] 10/01/2018, 2:58 PM  [facility] [address] [place], [place], [address] Phone: [phone]   Fax:  [phone]  Name: Dennis [name] MRN: [id number] Date of Birth: 1951-01-10

## 2018-10-04 ENCOUNTER — Encounter (HOSPITAL_COMMUNITY): Payer: Self-pay | Admitting: Occupational Therapy

## 2018-10-04 ENCOUNTER — Ambulatory Visit (HOSPITAL_COMMUNITY): Payer: Medicare HMO | Admitting: Occupational Therapy

## 2018-10-04 ENCOUNTER — Other Ambulatory Visit: Payer: Self-pay

## 2018-10-04 DIAGNOSIS — M25611 Stiffness of right shoulder, not elsewhere classified: Secondary | ICD-10-CM | POA: Diagnosis not present

## 2018-10-04 DIAGNOSIS — M25511 Pain in right shoulder: Secondary | ICD-10-CM

## 2018-10-04 NOTE — Therapy (Signed)
[place], [place], [address] Phone: [phone]   Fax:  [phone]  Occupational Therapy Treatment  Patient Details  Name: Dennis [name] MRN: [id number] Date of Birth: [birth year] Referring Provider (OT): Dr. Nicholes Stairs   Encounter Date: 10/04/2018  OT End of Session - 10/04/18 1352    Visit Number  8    Number of Visits  16    Date for OT Re-Evaluation  11/04/18   mini reassess on 10/09/18   Authorization Type  [employer] Medicare, no visit limit     OT Start Time  1310    OT Stop Time  1351    OT Time Calculation (min)  41 min    Activity Tolerance  Patient tolerated treatment well    Behavior During Therapy  [facility] for tasks assessed/performed       Past Medical History:  Diagnosis Date  . Coronary artery disease    a. s/p DES to LAD with DES to RCA, and PTCA to D1 in 06/2007 b. 08/2017: cath showing patent stents with 70% D1, 50% LCx, 60% mid-RCA, and 60% Acute Mrg stenosis. Medical therapy recommended.   Marland Kitchen GERD (gastroesophageal reflux disease)   . Gout   . Hypercholesteremia   . Hypertension   . Myocardial infarction ([place]) 1998    Past Surgical History:  Procedure Laterality Date  . bilateral eye surgery    . CARDIAC CATHETERIZATION  2009   PCI with DES to LAD,PTCA to diagonal, and DES to RCA LV gram LVEF 60%   . CARDIAC SURGERY    . COLONOSCOPY    . COLONOSCOPY  05/14/2011   Procedure: COLONOSCOPY;  Surgeon: Daneil Dolin, MD;  [facility];  Service: Endoscopy;  Laterality: N/A;  8:15 AM  . LEFT HEART CATH AND CORONARY ANGIOGRAPHY N/A 08/14/2017   Procedure: LEFT HEART CATH AND CORONARY ANGIOGRAPHY;  Surgeon: Martinique, Peter M, MD;  Location: [place] [facility];  Service: Cardiovascular;  Laterality: N/A;    There were no vitals filed for this visit.  Subjective Assessment - 10/04/18 1308    Subjective   S: It's been hurting some.    Currently in Pain?  Yes    Pain Score  4      Pain Location  Shoulder    Pain Orientation  Right    Pain Descriptors / Indicators  Aching;Sore    Pain Type  Acute pain    Pain Radiating Towards  n/a    Pain Onset  In the past 7 days    Pain Frequency  Occasional    Aggravating Factors   movement    Pain Relieving Factors  rest    Effect of Pain on Daily Activities  min effect on ADLs    Multiple Pain Sites  No         [facility] OT Assessment - 10/04/18 1308      Assessment   Medical Diagnosis  Right Shoulder Bursitis      Precautions   Precautions  None               OT Treatments/Exercises (OP) - 10/04/18 1311      Exercises   Exercises  Shoulder      Shoulder Exercises: Supine   Protraction  PROM;5 reps;Strengthening;15 reps    Protraction Weight (lbs)  2    Horizontal ABduction  PROM;5 reps;Strengthening;15 reps    Horizontal ABduction Weight (lbs)  2  External Rotation  PROM;5 reps;Strengthening;15 reps    External Rotation Weight (lbs)  2    Internal Rotation  PROM;5 reps;Strengthening;15 reps    Internal Rotation Weight (lbs)  2    Flexion  PROM;5 reps;Strengthening;15 reps    Shoulder Flexion Weight (lbs)  2    ABduction  PROM;5 reps;AROM;15 reps      Shoulder Exercises: Prone   Flexion  Strengthening;10 reps    Flexion Weight (lbs)  1    Extension  Strengthening;10 reps    Extension Weight (lbs)  1    Horizontal ABduction 1  Strengthening;10 reps    Horizontal ABduction 1 Weight (lbs)  1    Other Prone Exercises  scapular retraction, 1#, 10X      Shoulder Exercises: Standing   Protraction  Strengthening;15 reps;Theraband;10 reps    Theraband Level (Shoulder Protraction)  Level 2 (Red)    Protraction Weight (lbs)  1    Horizontal ABduction  Strengthening;15 reps;Theraband;10 reps    Theraband Level (Shoulder Horizontal ABduction)  Level 2 (Red)    Horizontal ABduction Weight (lbs)  1    External Rotation  Strengthening;15 reps;Theraband;10 reps    Theraband Level (Shoulder External  Rotation)  Level 2 (Red)    External Rotation Weight (lbs)  1    Internal Rotation  Strengthening;Theraband;15 reps    Internal Rotation Weight (lbs)  1    Flexion  Strengthening;15 reps    Shoulder Flexion Weight (lbs)  1    ABduction  Strengthening;5 reps;AROM;10 reps    Shoulder ABduction Weight (lbs)  1      Shoulder Exercises: Therapy Ball   Other Therapy Ball Exercises  green therapy ball: chest press, overhead press, flexion, circles each direction, 12X each      Shoulder Exercises: ROM/Strengthening   Wall Pushups  10 reps    X to V Arms  12X, 1#    Proximal Shoulder Strengthening, Seated  12X, 1#, no rest breaks    Other ROM/Strengthening Exercises  Y lift off, 10X      Manual Therapy   Manual Therapy  Myofascial release    Manual therapy comments  manual therapy completed seperately from all other interventions this date    Myofascial Release  myofascial release and manual stretching for right upper arm, scapular, and shoulder region to decrease pain and restrictions and improve pain free mobility in right shoulder region               OT Short Term Goals - 09/29/18 1035      OT SHORT TERM GOAL #1   Title  Patient will be educated on a HEP for improved right upper extremity use.     Time  4    Period  Weeks    Status  On-going    Target Date  10/07/18      OT SHORT TERM GOAL #2   Title  Patient will decrease pain in his right shoulder to 5/10 or less when threading belt through beltloops.     Time  4    Period  Weeks    Status  On-going      OT SHORT TERM GOAL #3   Title  Patient will improve right shoulder A/ROM to [facility] for improved ability to reach overhead and behind his back.     Time  4    Period  Weeks    Status  On-going      OT SHORT TERM GOAL #4   Title  Patient will decrease fascial restrictions from moderate to min-moderate for improved functional use of his right arm with less pain.     Time  4    Status  On-going        OT Long Term  Goals - 09/29/18 1035      OT LONG TERM GOAL #1   Title  Patient will use his right arm as dominant with all functional and leisure activities without pain.     Time  8    Period  Weeks    Status  On-going      OT LONG TERM GOAL #2   Title  Patient will have 2/10 pain or less when lying on his right side.     Time  8    Period  Weeks    Status  On-going      OT LONG TERM GOAL #3   Title  Patient will improve right shoulder A/ROM to WNL for improved ability to reach overhead and scratch his back without pain.     Time  8    Period  Weeks    Status  On-going      OT LONG TERM GOAL #4   Title  Patient will have 5/5 strength in his right shoulder without pain in order to lift car parts in his shop.     Time  8    Period  Weeks    Status  On-going      OT LONG TERM GOAL #5   Title  Patient will decrease fascial restrictions to minimal or less in his right shoulder in order to use his right arm with pain.      Time  8    Period  Weeks    Status  On-going            Plan - 10/04/18 1352    Clinical Impression Statement  A: Pt reporting increased soreness over the weekend and today. Pt completed supine strengthening with 2# with exception of abduction. Decreased to 1# weight in standing due to pt form and increased soreness. Added wall push-ups and red theraband strengthening tasks. Verbal cuing for form and technique during session.    Body Structure / Function / Physical Skills  ADL;UE functional use;Fascial restriction;Pain;ROM;IADL;Strength    Plan  P: Mini-reassessment, FOTO, determine if pt wants to continue therapy or discharge. If continuing make additional appts, if discharging update HEP       Patient will benefit from skilled therapeutic intervention in order to improve the following deficits and impairments:   Body Structure / Function / Physical Skills: ADL, UE functional use, Fascial restriction, Pain, ROM, IADL, Strength       Visit Diagnosis: 1. Acute pain  of right shoulder   2. Stiffness of right shoulder, not elsewhere classified       Problem List Patient Active Problem List   Diagnosis Date Noted  . Angina pectoris ([place]) 08/19/2015  . Hyperglycemia 04/27/2014  . Gout   . Hypertension   . Cardiovascular disease 08/26/2009  . GASTROESOPHAGEAL REFLUX DISEASE 08/26/2009  . Hypercholesterolemia 01/15/2009  . RHINITIS, CHRONIC 01/15/2009   Guadelupe Sabin, OTR/L  [phone] 10/04/2018, 1:55 PM  [facility] [address] [place], [place], [address] Phone: [phone]   Fax:  [phone]  Name: Dennis [name] MRN: [id number] Date of Birth: 1950/05/24

## 2018-10-06 ENCOUNTER — Other Ambulatory Visit: Payer: Self-pay

## 2018-10-06 ENCOUNTER — Encounter (HOSPITAL_COMMUNITY): Payer: Self-pay | Admitting: Specialist

## 2018-10-06 ENCOUNTER — Ambulatory Visit (HOSPITAL_COMMUNITY): Payer: Medicare HMO | Attending: Orthopedic Surgery | Admitting: Specialist

## 2018-10-06 DIAGNOSIS — M25511 Pain in right shoulder: Secondary | ICD-10-CM | POA: Diagnosis not present

## 2018-10-06 DIAGNOSIS — M25611 Stiffness of right shoulder, not elsewhere classified: Secondary | ICD-10-CM | POA: Insufficient documentation

## 2018-10-06 NOTE — Therapy (Signed)
[facility] [address] [place], [place], [address] Phone: [phone]   Fax:  [phone]  Occupational Therapy Treatment Progress Note Reporting Period 09/09/18 to 10/06/18  See note below for Objective Data and Assessment of Progress/Goals.       Patient Details  Name: Dennis [name] MRN: [id number] Date of Birth: 1951-04-03 Referring Provider (OT): Dr. Nicholes Stairs   Encounter Date: 10/06/2018  OT End of Session - 10/06/18 1425    Visit Number  9    Date for OT Re-Evaluation  11/04/18    Authorization Type  [employer] Medicare, no visit limit     OT Start Time  1306    OT Stop Time  1346    OT Time Calculation (min)  40 min    Activity Tolerance  Patient tolerated treatment well    Behavior During Therapy  [facility] for tasks assessed/performed       Past Medical History:  Diagnosis Date  . Coronary artery disease    a. s/p DES to LAD with DES to RCA, and PTCA to D1 in 06/2007 b. 08/2017: cath showing patent stents with 70% D1, 50% LCx, 60% mid-RCA, and 60% Acute Mrg stenosis. Medical therapy recommended.   Marland Kitchen GERD (gastroesophageal reflux disease)   . Gout   . Hypercholesteremia   . Hypertension   . Myocardial infarction ([place]) 1998    Past Surgical History:  Procedure Laterality Date  . bilateral eye surgery    . CARDIAC CATHETERIZATION  2009   PCI with DES to LAD,PTCA to diagonal, and DES to RCA LV gram LVEF 60%   . CARDIAC SURGERY    . COLONOSCOPY    . COLONOSCOPY  05/14/2011   Procedure: COLONOSCOPY;  Surgeon: Daneil Dolin, MD;  [facility];  Service: Endoscopy;  Laterality: N/A;  8:15 AM  . LEFT HEART CATH AND CORONARY ANGIOGRAPHY N/A 08/14/2017   Procedure: LEFT HEART CATH AND CORONARY ANGIOGRAPHY;  Surgeon: Martinique, Peter M, MD;  Location: [facility];  Service: Cardiovascular;  Laterality: N/A;    There were no vitals filed for this visit.  Subjective Assessment - 10/06/18 1425     Subjective   S:  Its still pretty sore in the morning and I have a hard time reaching up into a cabinet.    Currently in Pain?  Yes    Pain Score  4     Pain Location  Shoulder    Pain Orientation  Right    Pain Descriptors / Indicators  Aching;Sore         [facility] OT Assessment - 10/06/18 0001      Assessment   Medical Diagnosis  Right Shoulder Bursitis    Referring Provider (OT)  Dr. Nicholes Stairs      Precautions   Precautions  None      ADL   ADL comments  able to reach to shoulder height with less pain.  continues to experience pain when reaching overhead,  behind back, and when he attempts to lie on his right side.        Palpation   Palpation comment  moderate fascial restrictions in lateral upper arm      AROM   Overall AROM Comments  assessed in seated, external and internal rotation with shoulder adducted    Right Shoulder Flexion  155 Degrees   150   Right Shoulder ABduction  165 Degrees   110   Right Shoulder  Internal Rotation  90 Degrees   with shoulder abducted, 40 degrees, compared to LUE at 80    Right Shoulder External Rotation  90 Degrees   70     Strength   Overall Strength Comments  assessed in seated, external and internal rotation with shoulder adducted    Right Shoulder Flexion  5/5   4+/5   Right Shoulder ABduction  5/5   4+/5   Right Shoulder Internal Rotation  4+/5   4+/5   Right Shoulder External Rotation  5/5   4+/5              OT Treatments/Exercises (OP) - 10/06/18 0001      Shoulder Exercises: Supine   Protraction  PROM;5 reps    Horizontal ABduction  PROM;5 reps    External Rotation  PROM;5 reps    Internal Rotation  PROM;5 reps    Flexion  PROM;5 reps    ABduction  PROM;5 reps      Shoulder Exercises: Standing   External Rotation  Theraband;15 reps    Theraband Level (Shoulder External Rotation)  Level 3 (Green)    Internal Rotation  Theraband;15 reps    Theraband Level (Shoulder Internal Rotation)  Level 3  (Green)    Extension  Theraband;15 reps    Theraband Level (Shoulder Extension)  Level 3 (Green)    Retraction  Theraband;15 reps    Theraband Level (Shoulder Retraction)  Level 3 (Green)      Shoulder Exercises: Therapy Ball   Right/Left  5 reps      Shoulder Exercises: ROM/Strengthening   UBE (Upper Arm Bike)  Level 3,3' reverse at 6.0 intensity      Modalities   Modalities  Ultrasound      Ultrasound   Ultrasound Location  right lateral upper arm    Ultrasound Parameters  3 mhz 1.5 w/cm2 pulsed for 5 min    Ultrasound Goals  Pain      Manual Therapy   Manual Therapy  Myofascial release    Manual therapy comments  manual therapy completed seperately from all other interventions this date    Myofascial Release  myofascial release and manual stretching for right upper arm, scapular, and shoulder region to decrease pain and restrictions and improve pain free mobility in right shoulder region               OT Short Term Goals - 10/06/18 1334      OT SHORT TERM GOAL #1   Title  Patient will be educated on a HEP for improved right upper extremity use.     Time  4    Period  Weeks    Status  Achieved    Target Date  10/07/18      OT SHORT TERM GOAL #2   Title  Patient will decrease pain in his right shoulder to 5/10 or less when threading belt through beltloops.     Time  4    Period  Weeks    Status  On-going      OT SHORT TERM GOAL #3   Title  Patient will improve right shoulder A/ROM to [facility] for improved ability to reach overhead and behind his back.     Time  4    Period  Weeks    Status  Partially Met      OT SHORT TERM GOAL #4   Title  Patient will decrease fascial restrictions from moderate to min-moderate for improved functional use of  his right arm with less pain.     Time  4    Status  Achieved        OT Long Term Goals - 10/06/18 1428      OT LONG TERM GOAL #1   Title  Patient will use his right arm as dominant with all functional and leisure  activities without pain.     Time  8    Period  Weeks    Status  On-going      OT LONG TERM GOAL #2   Title  Patient will have 2/10 pain or less when lying on his right side.     Time  8    Period  Weeks    Status  On-going      OT LONG TERM GOAL #3   Title  Patient will improve right shoulder A/ROM to WNL for improved ability to reach overhead and scratch his back without pain.     Time  8    Period  Weeks    Status  On-going      OT LONG TERM GOAL #4   Title  Patient will have 5/5 strength in his right shoulder without pain in order to lift car parts in his shop.     Time  8    Period  Weeks    Status  Achieved      OT LONG TERM GOAL #5   Title  Patient will decrease fascial restrictions to minimal or less in his right shoulder in order to use his right arm with pain.      Time  8    Period  Weeks    Status  On-going            Plan - 10/06/18 1426    Clinical Impression Statement  A:  Patient has made significant improvements in A/ROM of flexion and abduction, as well as external rotation.  Internal rotation is lacking 40 degrees compared to left shoulder with shoulder abducted.  Patient continues to experience 4-5/10 pain with functional activities.    Body Structure / Function / Physical Skills  ADL;UE functional use;Fascial restriction;Pain;ROM;IADL;Strength    OT Frequency  2x / week    OT Duration  4 weeks    OT Treatment/Interventions  Self-care/ADL training;Ultrasound;Patient/family education;Passive range of motion;Cryotherapy;Electrical Stimulation;Moist Heat;Therapeutic exercise;Manual Therapy;Therapeutic activities    Plan  P:  Continue treatment, adding modalities for pain reduction, and focus on improving internal rotation function with combined abduction for improved ease and independence with B/IADLs and leisure activities.       Patient will benefit from skilled therapeutic intervention in order to improve the following deficits and impairments:    Body Structure / Function / Physical Skills: ADL, UE functional use, Fascial restriction, Pain, ROM, IADL, Strength       Visit Diagnosis: 1. Acute pain of right shoulder   2. Stiffness of right shoulder, not elsewhere classified       Problem List Patient Active Problem List   Diagnosis Date Noted  . Angina pectoris ([place]) 08/19/2015  . Hyperglycemia 04/27/2014  . Gout   . Hypertension   . Cardiovascular disease 08/26/2009  . GASTROESOPHAGEAL REFLUX DISEASE 08/26/2009  . Hypercholesterolemia 01/15/2009  . RHINITIS, CHRONIC 01/15/2009    Vangie Bicker, [place], OTR/L [phone]  10/06/2018, 2:39 PM  [facility] [place], [place], [address] Phone: [phone]   Fax:  [phone]  Name: MORTON SIMSON MRN:  [id number] Date of Birth: 1951/03/23

## 2018-10-12 ENCOUNTER — Ambulatory Visit (HOSPITAL_COMMUNITY): Payer: Medicare HMO | Admitting: Specialist

## 2018-10-12 ENCOUNTER — Other Ambulatory Visit: Payer: Self-pay

## 2018-10-12 DIAGNOSIS — M25511 Pain in right shoulder: Secondary | ICD-10-CM | POA: Diagnosis not present

## 2018-10-12 DIAGNOSIS — M25611 Stiffness of right shoulder, not elsewhere classified: Secondary | ICD-10-CM

## 2018-10-12 NOTE — Therapy (Signed)
[facility] [place], [place], [address] Phone: [phone]   Fax:  [phone]  Occupational Therapy Treatment  Patient Details  Name: Dennis [name] MRN: [id number] Date of Birth: Jun 14, 1950 Referring Provider (OT): Dr. Nicholes Stairs   Encounter Date: 10/12/2018  OT End of Session - 10/12/18 1510    Visit Number  10    Number of Visits  16    Date for OT Re-Evaluation  11/04/18    Authorization Type  [employer] Medicare, no visit limit     OT Start Time  1301    OT Stop Time  1347    OT Time Calculation (min)  46 min    Activity Tolerance  Patient tolerated treatment well    Behavior During Therapy  [facility] for tasks assessed/performed       Past Medical History:  Diagnosis Date  . Coronary artery disease    a. s/p DES to LAD with DES to RCA, and PTCA to D1 in 06/2007 b. 08/2017: cath showing patent stents with 70% D1, 50% LCx, 60% mid-RCA, and 60% Acute Mrg stenosis. Medical therapy recommended.   Marland Kitchen GERD (gastroesophageal reflux disease)   . Gout   . Hypercholesteremia   . Hypertension   . Myocardial infarction ([place]) 1998    Past Surgical History:  Procedure Laterality Date  . bilateral eye surgery    . CARDIAC CATHETERIZATION  2009   PCI with DES to LAD,PTCA to diagonal, and DES to RCA LV gram LVEF 60%   . CARDIAC SURGERY    . COLONOSCOPY    . COLONOSCOPY  05/14/2011   Procedure: COLONOSCOPY;  Surgeon: Daneil Dolin, MD;  [facility];  Service: Endoscopy;  Laterality: N/A;  8:15 AM  . LEFT HEART CATH AND CORONARY ANGIOGRAPHY N/A 08/14/2017   Procedure: LEFT HEART CATH AND CORONARY ANGIOGRAPHY;  Surgeon: Martinique, Peter M, MD;  Location: [place] [facility];  Service: Cardiovascular;  Laterality: N/A;    There were no vitals filed for this visit.  Subjective Assessment - 10/12/18 1509    Subjective   S:  It hasnt been too sore until today.    Currently in Pain?  Yes    Pain Score  2     Pain  Location  Shoulder    Pain Orientation  Right;Upper;Lateral         [facility] OT Assessment - 10/12/18 0001      Assessment   Medical Diagnosis  Right Shoulder Bursitis    Referring Provider (OT)  Dr. Nicholes Stairs      Precautions   Precautions  None               OT Treatments/Exercises (OP) - 10/12/18 0001      Shoulder Exercises: Supine   Protraction  PROM;5 reps    Horizontal ABduction  PROM;5 reps    External Rotation  PROM;5 reps    Internal Rotation  PROM;5 reps    Flexion  PROM;5 reps    ABduction  PROM;5 reps      Shoulder Exercises: Standing   External Rotation  Theraband;15 reps    Theraband Level (Shoulder External Rotation)  Level 3 (Green)    Internal Rotation  Theraband;15 reps    Theraband Level (Shoulder Internal Rotation)  Level 3 (Green)    Other Standing Exercises  with 5# dowel completed upright row, overhead press, overhead squat press, and clean and press 10 times each with  max verbal guidance for technique during upright row and clean and press.       Shoulder Exercises: Therapy Ball   Right/Left  5 reps      Modalities   Modalities  Ultrasound      Ultrasound   Ultrasound Location  right lateral upper arm    Ultrasound Parameters  3 mhz 1/5 w/cm2 pulsed    Ultrasound Goals  Pain      Manual Therapy   Manual Therapy  Myofascial release    Manual therapy comments  manual therapy completed seperately from all other interventions this date    Myofascial Release  myofascial release and manual stretching for right upper arm, scapular, and shoulder region to decrease pain and restrictions and improve pain free mobility in right shoulder region               OT Short Term Goals - 10/06/18 1334      OT SHORT TERM GOAL #1   Title  Patient will be educated on a HEP for improved right upper extremity use.     Time  4    Period  Weeks    Status  Achieved    Target Date  10/07/18      OT SHORT TERM GOAL #2   Title  Patient  will decrease pain in his right shoulder to 5/10 or less when threading belt through beltloops.     Time  4    Period  Weeks    Status  On-going      OT SHORT TERM GOAL #3   Title  Patient will improve right shoulder A/ROM to [facility] for improved ability to reach overhead and behind his back.     Time  4    Period  Weeks    Status  Partially Met      OT SHORT TERM GOAL #4   Title  Patient will decrease fascial restrictions from moderate to min-moderate for improved functional use of his right arm with less pain.     Time  4    Status  Achieved        OT Long Term Goals - 10/06/18 1428      OT LONG TERM GOAL #1   Title  Patient will use his right arm as dominant with all functional and leisure activities without pain.     Time  8    Period  Weeks    Status  On-going      OT LONG TERM GOAL #2   Title  Patient will have 2/10 pain or less when lying on his right side.     Time  8    Period  Weeks    Status  On-going      OT LONG TERM GOAL #3   Title  Patient will improve right shoulder A/ROM to WNL for improved ability to reach overhead and scratch his back without pain.     Time  8    Period  Weeks    Status  On-going      OT LONG TERM GOAL #4   Title  Patient will have 5/5 strength in his right shoulder without pain in order to lift car parts in his shop.     Time  8    Period  Weeks    Status  Achieved      OT LONG TERM GOAL #5   Title  Patient will decrease fascial restrictions to minimal or less in his  right shoulder in order to use his right arm with pain.      Time  8    Period  Weeks    Status  On-going            Plan - 10/12/18 1510    Clinical Impression Statement  A:  Patient reports less pain than previous sessions, continued with [place] for pain relief to lateral upper arm/shoulder region.  Therapeutic exercises focused on improving overall shoulder stability as well as improving internal rotation range of motion.    Body Structure / Function / Physical  Skills  ADL;UE functional use;Fascial restriction;Pain;ROM;IADL;Strength    Plan  P:  Continue [place] for pain relief, increase to blue theraband for internal/external rotation strengthening.       Patient will benefit from skilled therapeutic intervention in order to improve the following deficits and impairments:   Body Structure / Function / Physical Skills: ADL, UE functional use, Fascial restriction, Pain, ROM, IADL, Strength       Visit Diagnosis: 1. Acute pain of right shoulder   2. Stiffness of right shoulder, not elsewhere classified       Problem List Patient Active Problem List   Diagnosis Date Noted  . Angina pectoris ([place]) 08/19/2015  . Hyperglycemia 04/27/2014  . Gout   . Hypertension   . Cardiovascular disease 08/26/2009  . GASTROESOPHAGEAL REFLUX DISEASE 08/26/2009  . Hypercholesterolemia 01/15/2009  . RHINITIS, CHRONIC 01/15/2009    Vangie Bicker, [place], OTR/L [phone]  10/12/2018, 3:16 PM  [facility] [place], [place], [address] Phone: [phone]   Fax:  [phone]  Name: Dennis [name] MRN: [id number] Date of Birth: Nov 28, 1950

## 2018-10-15 ENCOUNTER — Other Ambulatory Visit: Payer: Self-pay

## 2018-10-15 ENCOUNTER — Encounter (HOSPITAL_COMMUNITY): Payer: Self-pay

## 2018-10-15 ENCOUNTER — Ambulatory Visit (HOSPITAL_COMMUNITY): Payer: Medicare HMO

## 2018-10-15 DIAGNOSIS — M25511 Pain in right shoulder: Secondary | ICD-10-CM

## 2018-10-15 DIAGNOSIS — M25611 Stiffness of right shoulder, not elsewhere classified: Secondary | ICD-10-CM

## 2018-10-15 NOTE — Therapy (Signed)
[facility] [place], [place], [address] Phone: [phone]   Fax:  [phone]  Occupational Therapy Treatment  Patient Details  Name: Dennis [name] MRN: [id number] Date of Birth: 19-Sep-1950 Referring Provider (OT): Dr. Nicholes Stairs   Encounter Date: 10/15/2018  OT End of Session - 10/15/18 1001    Visit Number  11    Number of Visits  16    Date for OT Re-Evaluation  11/04/18    Authorization Type  [employer] Medicare, no visit limit     OT Start Time  0919    OT Stop Time  1000    OT Time Calculation (min)  41 min    Activity Tolerance  Patient tolerated treatment well    Behavior During Therapy  [facility] for tasks assessed/performed       Past Medical History:  Diagnosis Date  . Coronary artery disease    a. s/p DES to LAD with DES to RCA, and PTCA to D1 in 06/2007 b. 08/2017: cath showing patent stents with 70% D1, 50% LCx, 60% mid-RCA, and 60% Acute Mrg stenosis. Medical therapy recommended.   Marland Kitchen GERD (gastroesophageal reflux disease)   . Gout   . Hypercholesteremia   . Hypertension   . Myocardial infarction ([place]) 1998    Past Surgical History:  Procedure Laterality Date  . bilateral eye surgery    . CARDIAC CATHETERIZATION  2009   PCI with DES to LAD,PTCA to diagonal, and DES to RCA LV gram LVEF 60%   . CARDIAC SURGERY    . COLONOSCOPY    . COLONOSCOPY  05/14/2011   Procedure: COLONOSCOPY;  Surgeon: Daneil Dolin, MD;  [facility];  Service: Endoscopy;  Laterality: N/A;  8:15 AM  . LEFT HEART CATH AND CORONARY ANGIOGRAPHY N/A 08/14/2017   Procedure: LEFT HEART CATH AND CORONARY ANGIOGRAPHY;  Surgeon: Martinique, Peter M, MD;  Location: [place] [facility];  Service: Cardiovascular;  Laterality: N/A;    There were no vitals filed for this visit.  Subjective Assessment - 10/15/18 0936    Subjective   S: It only hurts first    Currently in Pain?  No/denies         [facility] OT Assessment -  10/15/18 1009      Assessment   Medical Diagnosis  Right Shoulder Bursitis      Precautions   Precautions  None               OT Treatments/Exercises (OP) - 10/15/18 0938      Exercises   Exercises  Shoulder      Shoulder Exercises: Supine   Protraction  PROM;5 reps    Horizontal ABduction  PROM;5 reps    External Rotation  PROM;5 reps    Internal Rotation  PROM;5 reps    Flexion  PROM;5 reps    ABduction  PROM;5 reps      Shoulder Exercises: Standing   Protraction  Theraband;12 reps    Theraband Level (Shoulder Protraction)  Level 3 (Green)    External Rotation  Theraband;12 reps    Theraband Level (Shoulder External Rotation)  Level 4 (Blue)   towel roll   Internal Rotation  Theraband;12 reps    Theraband Level (Shoulder Internal Rotation)  Level 4 (Blue)   towel roll   ABduction  Theraband;12 reps    Theraband Level (Shoulder ABduction)  Level 3 (Green)    Extension  [employer]  Theraband Level (Shoulder Extension)  Level 4 (Blue)    Other Standing Exercises  Green theraband; adduction; 12X    Other Standing Exercises  5# Dowel standing: flexion, overhead press, upright row      Shoulder Exercises: Therapy Ball   Right/Left  5 reps      Shoulder Exercises: ROM/Strengthening   UBE (Upper Arm Bike)  Level 7 3' reverse only   pace: 6.0-7.0   Other ROM/Strengthening Exercises  Shoulder mobility exercises using long PVC pipe prior to exercises.       Manual Therapy   Manual Therapy  Myofascial release    Manual therapy comments  manual therapy completed seperately from all other interventions this date    Myofascial Release  myofascial release and manual stretching for right upper arm, scapular, and shoulder region to decrease pain and restrictions and improve pain free mobility in right shoulder region               OT Short Term Goals - 10/15/18 1005      OT SHORT TERM GOAL #1   Title  Patient will be educated on a HEP for improved  right upper extremity use.     Time  4    Period  Weeks    Target Date  10/07/18      OT SHORT TERM GOAL #2   Title  Patient will decrease pain in his right shoulder to 5/10 or less when threading belt through beltloops.     Time  4    Period  Weeks    Status  On-going      OT SHORT TERM GOAL #3   Title  Patient will improve right shoulder A/ROM to [facility] for improved ability to reach overhead and behind his back.     Time  4    Period  Weeks    Status  Partially Met      OT SHORT TERM GOAL #4   Title  Patient will decrease fascial restrictions from moderate to min-moderate for improved functional use of his right arm with less pain.     Time  4        OT Long Term Goals - 10/15/18 1005      OT LONG TERM GOAL #1   Title  Patient will use his right arm as dominant with all functional and leisure activities without pain.     Time  8    Period  Weeks    Status  On-going      OT LONG TERM GOAL #2   Title  Patient will have 2/10 pain or less when lying on his right side.     Time  8    Period  Weeks    Status  On-going      OT LONG TERM GOAL #3   Title  Patient will improve right shoulder A/ROM to WNL for improved ability to reach overhead and scratch his back without pain.     Time  8    Period  Weeks    Status  On-going      OT LONG TERM GOAL #4   Title  Patient will have 5/5 strength in his right shoulder without pain in order to lift car parts in his shop.     Time  8    Period  Weeks      OT LONG TERM GOAL #5   Title  Patient will decrease fascial restrictions to minimal or less in his  right shoulder in order to use his right arm with pain.      Time  8    Period  Weeks    Status  On-going            Plan - 10/15/18 1001    Clinical Impression Statement  A: Pt with no pain at arrival and did not require use of [place] for pain relief. Min fascial restrictions noted in medial deltoid region. Mainly limited with internal rotation mvements and focused on increasing  joint mobility during session. Progressed to blue theraband for internal and external strengthening. VC for form and technque were required. Wall mirror was used for visual feedback during dowel rod exercises with great carry over.    Body Structure / Function / Physical Skills  ADL;UE functional use;Fascial restriction;Pain;ROM;IADL;Strength    Plan  P: Pt voices main difficulty with rolling onto arm when getting out of bed and when reaching overhead. Focus on overhead reaching by increasing shoulder and scapular stability. Add any weight if appropriate. Complete plank and push up variations as appropriate to work on weightbearing with increased shoulder and scapular strength.    Consulted and Agree with Plan of Care  Patient       Patient will benefit from skilled therapeutic intervention in order to improve the following deficits and impairments:   Body Structure / Function / Physical Skills: ADL, UE functional use, Fascial restriction, Pain, ROM, IADL, Strength       Visit Diagnosis: 1. Acute pain of right shoulder   2. Stiffness of right shoulder, not elsewhere classified       Problem List Patient Active Problem List   Diagnosis Date Noted  . Angina pectoris ([place]) 08/19/2015  . Hyperglycemia 04/27/2014  . Gout   . Hypertension   . Cardiovascular disease 08/26/2009  . GASTROESOPHAGEAL REFLUX DISEASE 08/26/2009  . Hypercholesterolemia 01/15/2009  . RHINITIS, CHRONIC 01/15/2009    Ailene Ravel, OTR/L,CBIS  [phone]  10/15/2018, 10:09 AM  [facility] [address] [place], [place], [address] Phone: [phone]   Fax:  [phone]  Name: JAMARQUIS CRULL MRN: [id number] Date of Birth: Feb 05, 1951

## 2018-10-20 ENCOUNTER — Other Ambulatory Visit: Payer: Self-pay

## 2018-10-20 ENCOUNTER — Ambulatory Visit (HOSPITAL_COMMUNITY): Payer: Medicare HMO | Admitting: Occupational Therapy

## 2018-10-20 ENCOUNTER — Encounter (HOSPITAL_COMMUNITY): Payer: Self-pay | Admitting: Occupational Therapy

## 2018-10-20 DIAGNOSIS — M25611 Stiffness of right shoulder, not elsewhere classified: Secondary | ICD-10-CM

## 2018-10-20 DIAGNOSIS — M25511 Pain in right shoulder: Secondary | ICD-10-CM

## 2018-10-20 NOTE — Therapy (Signed)
[place], [place], [address] Phone: [phone]   Fax:  [phone]  Occupational Therapy Treatment  Patient Details  Name: Dennis [name] MRN: [id number] Date of Birth: 10/14/1950 Referring Provider (OT): Dr. Nicholes Stairs   Encounter Date: 10/20/2018  OT End of Session - 10/20/18 1355    Visit Number  12    Number of Visits  16    Date for OT Re-Evaluation  11/04/18    Authorization Type  [employer] Medicare, no visit limit     OT Start Time  1310    OT Stop Time  1352    OT Time Calculation (min)  42 min    Activity Tolerance  Patient tolerated treatment well    Behavior During Therapy  [facility] for tasks assessed/performed       Past Medical History:  Diagnosis Date  . Coronary artery disease    a. s/p DES to LAD with DES to RCA, and PTCA to D1 in 06/2007 b. 08/2017: cath showing patent stents with 70% D1, 50% LCx, 60% mid-RCA, and 60% Acute Mrg stenosis. Medical therapy recommended.   Marland Kitchen GERD (gastroesophageal reflux disease)   . Gout   . Hypercholesteremia   . Hypertension   . Myocardial infarction ([place]) 1998    Past Surgical History:  Procedure Laterality Date  . bilateral eye surgery    . CARDIAC CATHETERIZATION  2009   PCI with DES to LAD,PTCA to diagonal, and DES to RCA LV gram LVEF 60%   . CARDIAC SURGERY    . COLONOSCOPY    . COLONOSCOPY  05/14/2011   Procedure: COLONOSCOPY;  Surgeon: Daneil Dolin, MD;  [facility];  Service: Endoscopy;  Laterality: N/A;  8:15 AM  . LEFT HEART CATH AND CORONARY ANGIOGRAPHY N/A 08/14/2017   Procedure: LEFT HEART CATH AND CORONARY ANGIOGRAPHY;  Surgeon: Martinique, Peter M, MD;  Location: [place] [facility];  Service: Cardiovascular;  Laterality: N/A;    There were no vitals filed for this visit.  Subjective Assessment - 10/20/18 1308    Subjective   S: I've had some sharp pains that last for about a minute or so.    Currently in Pain?  No/denies          [facility] OT Assessment - 10/20/18 1308      Assessment   Medical Diagnosis  Right Shoulder Bursitis      Precautions   Precautions  None               OT Treatments/Exercises (OP) - 10/20/18 1308      Exercises   Exercises  Shoulder      Shoulder Exercises: Supine   Protraction  PROM;5 reps    Horizontal ABduction  PROM;5 reps    External Rotation  PROM;5 reps    Internal Rotation  PROM;5 reps    Flexion  PROM;5 reps    ABduction  PROM;5 reps      Shoulder Exercises: Prone   Other Prone Exercises  bird dog, straight arm plank, plank on knees, 25" holds      Shoulder Exercises: Standing   Protraction  Theraband;15 reps    Theraband Level (Shoulder Protraction)  Level 4 (Blue)    Horizontal ABduction  Theraband;15 reps    Theraband Level (Shoulder Horizontal ABduction)  Level 4 (Blue)    External Rotation  Theraband;15 reps    Theraband Level (Shoulder External Rotation)  Level 4 (Blue)  Internal Rotation  Theraband;15 reps    Theraband Level (Shoulder Internal Rotation)  Level 4 (Blue)    ABduction  Theraband;15 reps    Theraband Level (Shoulder ABduction)  Level 4 (Blue)    Extension  Theraband;15 reps    Theraband Level (Shoulder Extension)  Level 4 (Blue)    Other Standing Exercises  5# Dowel standing: flexion, overhead press, upright row, 15X       Shoulder Exercises: ROM/Strengthening   UBE (Upper Arm Bike)  Level 7 3' reverse only   pace: 8.0   Wall Pushups  10 reps   using chair   Prot/Ret//Elev/Dep  1' abduction with red ball    Other ROM/Strengthening Exercises  arnold press with 2#, 10X    Other ROM/Strengthening Exercises  ball pass behind back: 10X with tennis ball      Functional Reaching Activities   High Level  Pt using small box with 5# weight inside for lifting activity. Pt lifted box and placed on top of refriderator, then removed. 10X      Manual Therapy   Manual Therapy  Myofascial release    Manual therapy comments  manual  therapy completed seperately from all other interventions this date    Myofascial Release  myofascial release and manual stretching for right upper arm, scapular, and shoulder region to decrease pain and restrictions and improve pain free mobility in right shoulder region               OT Short Term Goals - 10/15/18 1005      OT SHORT TERM GOAL #1   Title  Patient will be educated on a HEP for improved right upper extremity use.     Time  4    Period  Weeks    Target Date  10/07/18      OT SHORT TERM GOAL #2   Title  Patient will decrease pain in his right shoulder to 5/10 or less when threading belt through beltloops.     Time  4    Period  Weeks    Status  On-going      OT SHORT TERM GOAL #3   Title  Patient will improve right shoulder A/ROM to [facility] for improved ability to reach overhead and behind his back.     Time  4    Period  Weeks    Status  Partially Met      OT SHORT TERM GOAL #4   Title  Patient will decrease fascial restrictions from moderate to min-moderate for improved functional use of his right arm with less pain.     Time  4        OT Long Term Goals - 10/15/18 1005      OT LONG TERM GOAL #1   Title  Patient will use his right arm as dominant with all functional and leisure activities without pain.     Time  8    Period  Weeks    Status  On-going      OT LONG TERM GOAL #2   Title  Patient will have 2/10 pain or less when lying on his right side.     Time  8    Period  Weeks    Status  On-going      OT LONG TERM GOAL #3   Title  Patient will improve right shoulder A/ROM to WNL for improved ability to reach overhead and scratch his back without pain.  Time  8    Period  Weeks    Status  On-going      OT LONG TERM GOAL #4   Title  Patient will have 5/5 strength in his right shoulder without pain in order to lift car parts in his shop.     Time  8    Period  Weeks      OT LONG TERM GOAL #5   Title  Patient will decrease fascial  restrictions to minimal or less in his right shoulder in order to use his right arm with pain.      Time  8    Period  Weeks    Status  On-going            Plan - 10/20/18 1355    Clinical Impression Statement  A: Pt reporting occasional pain in medial deltoid, short-lived. Continued with shoulder and scapular strengthening and stability, added overhead reaching and arnold press today. Pt with max difficulty with abduction using blue theraband. Verbal cuing for form and technique.    Body Structure / Function / Physical Skills  ADL;UE functional use;Fascial restriction;Pain;ROM;IADL;Strength    Plan  P: Continue with weigthed overhead lifting working on strengthening and stability       Patient will benefit from skilled therapeutic intervention in order to improve the following deficits and impairments:   Body Structure / Function / Physical Skills: ADL, UE functional use, Fascial restriction, Pain, ROM, IADL, Strength       Visit Diagnosis: 1. Acute pain of right shoulder   2. Stiffness of right shoulder, not elsewhere classified       Problem List Patient Active Problem List   Diagnosis Date Noted  . Angina pectoris ([place]) 08/19/2015  . Hyperglycemia 04/27/2014  . Gout   . Hypertension   . Cardiovascular disease 08/26/2009  . GASTROESOPHAGEAL REFLUX DISEASE 08/26/2009  . Hypercholesterolemia 01/15/2009  . RHINITIS, CHRONIC 01/15/2009   Guadelupe Sabin, OTR/L  [phone] 10/20/2018, 1:58 PM  [place] [address] [place], [place], [address] Phone: [phone]   Fax:  [phone]  Name: Dennis [name] MRN: [id number] Date of Birth: April 30, 1950

## 2018-10-21 ENCOUNTER — Ambulatory Visit (HOSPITAL_COMMUNITY): Payer: Medicare HMO

## 2018-10-21 ENCOUNTER — Encounter (HOSPITAL_COMMUNITY): Payer: Self-pay

## 2018-10-21 DIAGNOSIS — M25611 Stiffness of right shoulder, not elsewhere classified: Secondary | ICD-10-CM | POA: Diagnosis not present

## 2018-10-21 DIAGNOSIS — M25511 Pain in right shoulder: Secondary | ICD-10-CM | POA: Diagnosis not present

## 2018-10-21 NOTE — Therapy (Signed)
[place], [place], [address] Phone: [phone]   Fax:  [phone]  Occupational Therapy Treatment  Patient Details  Name: Dennis [name] MRN: [id number] Date of Birth: Dec 20, 1950 Referring Provider (OT): Dr. Nicholes Stairs   Encounter Date: 10/21/2018  OT End of Session - 10/21/18 1517    Visit Number  13    Number of Visits  16    Date for OT Re-Evaluation  11/04/18    Authorization Type  [employer] Medicare, no visit limit     OT Start Time  1434    OT Stop Time  1512    OT Time Calculation (min)  38 min    Activity Tolerance  Patient tolerated treatment well    Behavior During Therapy  [facility] for tasks assessed/performed       Past Medical History:  Diagnosis Date  . Coronary artery disease    a. s/p DES to LAD with DES to RCA, and PTCA to D1 in 06/2007 b. 08/2017: cath showing patent stents with 70% D1, 50% LCx, 60% mid-RCA, and 60% Acute Mrg stenosis. Medical therapy recommended.   Marland Kitchen GERD (gastroesophageal reflux disease)   . Gout   . Hypercholesteremia   . Hypertension   . Myocardial infarction ([place]) 1998    Past Surgical History:  Procedure Laterality Date  . bilateral eye surgery    . CARDIAC CATHETERIZATION  2009   PCI with DES to LAD,PTCA to diagonal, and DES to RCA LV gram LVEF 60%   . CARDIAC SURGERY    . COLONOSCOPY    . COLONOSCOPY  05/14/2011   Procedure: COLONOSCOPY;  Surgeon: Daneil Dolin, MD;  [facility];  Service: Endoscopy;  Laterality: N/A;  8:15 AM  . LEFT HEART CATH AND CORONARY ANGIOGRAPHY N/A 08/14/2017   Procedure: LEFT HEART CATH AND CORONARY ANGIOGRAPHY;  Surgeon: Martinique, Peter M, MD;  Location: [place] [facility];  Service: Cardiovascular;  Laterality: N/A;    There were no vitals filed for this visit.      [facility] OT Assessment - 10/21/18 1521      Assessment   Medical Diagnosis  Right Shoulder Bursitis      Precautions   Precautions  None                OT Treatments/Exercises (OP) - 10/21/18 1448      Exercises   Exercises  Shoulder      Shoulder Exercises: Prone   Other Prone Exercises  bird dog: 5" hold while alternating arm and leg for 1 minute. High plank: 25"      Shoulder Exercises: Standing   Protraction  Theraband;15 reps    Theraband Level (Shoulder Protraction)  Level 4 (Blue)    Horizontal ABduction  Theraband;15 reps    Theraband Level (Shoulder Horizontal ABduction)  Level 4 (Blue)    External Rotation  Theraband;15 reps    Theraband Level (Shoulder External Rotation)  Level 4 (Blue)   towel roll   Internal Rotation  Theraband;15 reps    Theraband Level (Shoulder Internal Rotation)  Level 4 (Blue)    ABduction  Theraband;15 reps    Theraband Level (Shoulder ABduction)  Level 4 (Blue)    Extension  Theraband;15 reps    Theraband Level (Shoulder Extension)  Level 4 (Blue)    Other Standing Exercises  Blue theraband; adduction; 15X    Other Standing Exercises  5# Dowel standing: flexion, overhead press, upright row,  15X       Shoulder Exercises: ROM/Strengthening   UBE (Upper Arm Bike)  Level 7 3' reverse only   pace: 8.0-9.0   Wall Pushups  10 reps   using back of chair   Ball on Wall  1' abduction red ball             OT Education - 10/21/18 1516    Education Details  pt provided with blue theraband for scapular exercises. Already has handout.    Person(s) Educated  Patient    Methods  Explanation    Comprehension  Verbalized understanding       OT Short Term Goals - 10/15/18 1005      OT SHORT TERM GOAL #1   Title  Patient will be educated on a HEP for improved right upper extremity use.     Time  4    Period  Weeks    Target Date  10/07/18      OT SHORT TERM GOAL #2   Title  Patient will decrease pain in his right shoulder to 5/10 or less when threading belt through beltloops.     Time  4    Period  Weeks    Status  On-going      OT SHORT TERM GOAL #3   Title   Patient will improve right shoulder A/ROM to [facility] for improved ability to reach overhead and behind his back.     Time  4    Period  Weeks    Status  Partially Met      OT SHORT TERM GOAL #4   Title  Patient will decrease fascial restrictions from moderate to min-moderate for improved functional use of his right arm with less pain.     Time  4        OT Long Term Goals - 10/15/18 1005      OT LONG TERM GOAL #1   Title  Patient will use his right arm as dominant with all functional and leisure activities without pain.     Time  8    Period  Weeks    Status  On-going      OT LONG TERM GOAL #2   Title  Patient will have 2/10 pain or less when lying on his right side.     Time  8    Period  Weeks    Status  On-going      OT LONG TERM GOAL #3   Title  Patient will improve right shoulder A/ROM to WNL for improved ability to reach overhead and scratch his back without pain.     Time  8    Period  Weeks    Status  On-going      OT LONG TERM GOAL #4   Title  Patient will have 5/5 strength in his right shoulder without pain in order to lift car parts in his shop.     Time  8    Period  Weeks      OT LONG TERM GOAL #5   Title  Patient will decrease fascial restrictions to minimal or less in his right shoulder in order to use his right arm with pain.      Time  8    Period  Weeks    Status  On-going            Plan - 10/21/18 1517    Clinical Impression Statement  A: Continued to work on weightbearing  to increase scapular strength and stability in addition to shoulder strengthening. Pt showed increased ability to complete abduction with blue band. Required towel roll with external rotation to place scapula in appropriate plane and allow for maximum performance. VC for form and technique.    Body Structure / Function / Physical Skills  ADL;UE functional use;Fascial restriction;Pain;ROM;IADL;Strength    Plan  P: Add cybex row, press, and overhead press. Push-ups on knees?    Consulted and Agree with Plan of Care  Patient       Patient will benefit from skilled therapeutic intervention in order to improve the following deficits and impairments:   Body Structure / Function / Physical Skills: ADL, UE functional use, Fascial restriction, Pain, ROM, IADL, Strength       Visit Diagnosis: 1. Stiffness of right shoulder, not elsewhere classified   2. Acute pain of right shoulder       Problem List Patient Active Problem List   Diagnosis Date Noted  . Angina pectoris ([place]) 08/19/2015  . Hyperglycemia 04/27/2014  . Gout   . Hypertension   . Cardiovascular disease 08/26/2009  . GASTROESOPHAGEAL REFLUX DISEASE 08/26/2009  . Hypercholesterolemia 01/15/2009  . RHINITIS, CHRONIC 01/15/2009   Ailene Ravel, OTR/L,CBIS  [phone]  10/21/2018, 3:21 PM  [place] [address] [place], [place], [address] Phone: [phone]   Fax:  [phone]  Name: Dennis [name] MRN: [id number] Date of Birth: [birth year]

## 2018-10-26 ENCOUNTER — Ambulatory Visit (HOSPITAL_COMMUNITY): Payer: Medicare HMO

## 2018-10-28 ENCOUNTER — Other Ambulatory Visit: Payer: Self-pay

## 2018-10-28 ENCOUNTER — Ambulatory Visit (HOSPITAL_COMMUNITY): Payer: Medicare HMO

## 2018-10-28 ENCOUNTER — Encounter (HOSPITAL_COMMUNITY): Payer: Self-pay

## 2018-10-28 DIAGNOSIS — M25611 Stiffness of right shoulder, not elsewhere classified: Secondary | ICD-10-CM

## 2018-10-28 DIAGNOSIS — M25511 Pain in right shoulder: Secondary | ICD-10-CM

## 2018-10-28 NOTE — Therapy (Signed)
[place], [place], [address] Phone: [phone]   Fax:  [phone]  Occupational Therapy Treatment  Patient Details  Name: Dennis [name] MRN: [id number] Date of Birth: 06-01-1950 Referring Provider (OT): Dr. Nicholes Stairs   Encounter Date: 10/28/2018  OT End of Session - 10/28/18 1520    Visit Number  14    Number of Visits  16    Date for OT Re-Evaluation  11/04/18    Authorization Type  [employer] Medicare, no visit limit     OT Start Time  1434    OT Stop Time  1515    OT Time Calculation (min)  41 min    Activity Tolerance  Patient tolerated treatment well    Behavior During Therapy  [facility] for tasks assessed/performed       Past Medical History:  Diagnosis Date  . Coronary artery disease    a. s/p DES to LAD with DES to RCA, and PTCA to D1 in 06/2007 b. 08/2017: cath showing patent stents with 70% D1, 50% LCx, 60% mid-RCA, and 60% Acute Mrg stenosis. Medical therapy recommended.   Marland Kitchen GERD (gastroesophageal reflux disease)   . Gout   . Hypercholesteremia   . Hypertension   . Myocardial infarction ([place]) 1998    Past Surgical History:  Procedure Laterality Date  . bilateral eye surgery    . CARDIAC CATHETERIZATION  2009   PCI with DES to LAD,PTCA to diagonal, and DES to RCA LV gram LVEF 60%   . CARDIAC SURGERY    . COLONOSCOPY    . COLONOSCOPY  05/14/2011   Procedure: COLONOSCOPY;  Surgeon: Daneil Dolin, MD;  [facility];  Service: Endoscopy;  Laterality: N/A;  8:15 AM  . LEFT HEART CATH AND CORONARY ANGIOGRAPHY N/A 08/14/2017   Procedure: LEFT HEART CATH AND CORONARY ANGIOGRAPHY;  Surgeon: Martinique, Peter M, MD;  Location: [place] [facility];  Service: Cardiovascular;  Laterality: N/A;    There were no vitals filed for this visit.  Subjective Assessment - 10/28/18 1441    Subjective   S: Since I started therapy it's been easier to sleep on my left side.    Currently in Pain?   No/denies         [facility] OT Assessment - 10/28/18 1435      Assessment   Medical Diagnosis  Right Shoulder Bursitis      Precautions   Precautions  None               OT Treatments/Exercises (OP) - 10/28/18 1435      Exercises   Exercises  Shoulder      Shoulder Exercises: Supine   Protraction  PROM;5 reps    Horizontal ABduction  PROM;5 reps    External Rotation  PROM;5 reps    Internal Rotation  PROM;5 reps    Flexion  PROM;5 reps    ABduction  PROM;5 reps      Shoulder Exercises: Sidelying   External Rotation  Strengthening;12 reps    External Rotation Weight (lbs)  3    Internal Rotation  Strengthening;12 reps    Internal Rotation Weight (lbs)  3    Flexion  Strengthening;12 reps    Flexion Weight (lbs)  3    ABduction  Strengthening;12 reps    ABduction Weight (lbs)  2    Other Sidelying Exercises  Protraction; 10X; 3#    Other Sidelying Exercises  Horizontal abduction;  12X; 3#      Shoulder Exercises: Standing   Other Standing Exercises  Red band loop: wall slide Y lift off 10X; wall walks: 8X; wall clock: 2,3, 4 o'clock 10X each      Other Standing Exercises  Body Craft: Press: 10X 20#, 30# overhead press 10X 10#, 20#      Shoulder Exercises: ROM/Strengthening   UBE (Upper Arm Bike)  Level 7 3' reverse only   pace: 11.0-12.0   Over Head Lace  2" seated with 3# wrist weight    X to V Arms  10X with 3#    Ball on Wall  1' abduction, 1' flexion green ball               OT Short Term Goals - 10/15/18 1005      OT SHORT TERM GOAL #1   Title  Patient will be educated on a HEP for improved right upper extremity use.     Time  4    Period  Weeks    Target Date  10/07/18      OT SHORT TERM GOAL #2   Title  Patient will decrease pain in his right shoulder to 5/10 or less when threading belt through beltloops.     Time  4    Period  Weeks    Status  On-going      OT SHORT TERM GOAL #3   Title  Patient will improve right shoulder A/ROM to  [facility] for improved ability to reach overhead and behind his back.     Time  4    Period  Weeks    Status  Partially Met      OT SHORT TERM GOAL #4   Title  Patient will decrease fascial restrictions from moderate to min-moderate for improved functional use of his right arm with less pain.     Time  4        OT Long Term Goals - 10/15/18 1005      OT LONG TERM GOAL #1   Title  Patient will use his right arm as dominant with all functional and leisure activities without pain.     Time  8    Period  Weeks    Status  On-going      OT LONG TERM GOAL #2   Title  Patient will have 2/10 pain or less when lying on his right side.     Time  8    Period  Weeks    Status  On-going      OT LONG TERM GOAL #3   Title  Patient will improve right shoulder A/ROM to WNL for improved ability to reach overhead and scratch his back without pain.     Time  8    Period  Weeks    Status  On-going      OT LONG TERM GOAL #4   Title  Patient will have 5/5 strength in his right shoulder without pain in order to lift car parts in his shop.     Time  8    Period  Weeks      OT LONG TERM GOAL #5   Title  Patient will decrease fascial restrictions to minimal or less in his right shoulder in order to use his right arm with pain.      Time  8    Period  Weeks    Status  On-going  Plan - 10/28/18 1520    Clinical Impression Statement  A: Added additional scapular strengthening exercises this session including sidelying strengthening with 2-3# hand weights and red band exercises at wall. Patient reports some pain during sidelying abduction with 3# and required a decrease to 2#. Patient also had several rest breaks during abduction when completing ball on the wall. VC for form and technique were provided.    Body Structure / Function / Physical Skills  ADL;UE functional use;Fascial restriction;Pain;ROM;IADL;Strength    Plan  P: Push ups on knees? Continue with red band loop exercises to work  on scapular stability.    Consulted and Agree with Plan of Care  Patient       Patient will benefit from skilled therapeutic intervention in order to improve the following deficits and impairments:   Body Structure / Function / Physical Skills: ADL, UE functional use, Fascial restriction, Pain, ROM, IADL, Strength       Visit Diagnosis: 1. Stiffness of right shoulder, not elsewhere classified   2. Acute pain of right shoulder       Problem List Patient Active Problem List   Diagnosis Date Noted  . Angina pectoris ([address]) 08/19/2015  . Hyperglycemia 04/27/2014  . Gout   . Hypertension   . Cardiovascular disease 08/26/2009  . GASTROESOPHAGEAL REFLUX DISEASE 08/26/2009  . Hypercholesterolemia 01/15/2009  . RHINITIS, CHRONIC 01/15/2009   Ailene Ravel, OTR/L,CBIS  [phone]  10/28/2018, 3:23 PM  [place] [address] [place], [place], [address] Phone: [phone]   Fax:  [phone]  Name: Dennis [name] MRN: [id number] Date of Birth: Apr 02, 1951

## 2018-11-01 ENCOUNTER — Ambulatory Visit (HOSPITAL_COMMUNITY): Payer: Medicare HMO

## 2018-11-01 ENCOUNTER — Other Ambulatory Visit: Payer: Self-pay

## 2018-11-01 ENCOUNTER — Encounter (HOSPITAL_COMMUNITY): Payer: Self-pay

## 2018-11-01 DIAGNOSIS — M25611 Stiffness of right shoulder, not elsewhere classified: Secondary | ICD-10-CM

## 2018-11-01 DIAGNOSIS — M25511 Pain in right shoulder: Secondary | ICD-10-CM

## 2018-11-01 NOTE — Therapy (Signed)
[facility] [place], [place], [address] Phone: [phone]   Fax:  [phone]  Occupational Therapy Treatment  Patient Details  Name: Dennis [name] MRN: [id number] Date of Birth: 02/21/1951 Referring Provider (OT): Dr. Nicholes Stairs   Encounter Date: 11/01/2018  OT End of Session - 11/01/18 1512    Visit Number  15    Number of Visits  16    Date for OT Re-Evaluation  11/04/18    Authorization Type  [employer] Medicare, no visit limit     OT Start Time  1435    OT Stop Time  1515    OT Time Calculation (min)  40 min    Activity Tolerance  Patient tolerated treatment well    Behavior During Therapy  [facility] for tasks assessed/performed       Past Medical History:  Diagnosis Date  . Coronary artery disease    a. s/p DES to LAD with DES to RCA, and PTCA to D1 in 06/2007 b. 08/2017: cath showing patent stents with 70% D1, 50% LCx, 60% mid-RCA, and 60% Acute Mrg stenosis. Medical therapy recommended.   Marland Kitchen GERD (gastroesophageal reflux disease)   . Gout   . Hypercholesteremia   . Hypertension   . Myocardial infarction ([place]) 1998    Past Surgical History:  Procedure Laterality Date  . bilateral eye surgery    . CARDIAC CATHETERIZATION  2009   PCI with DES to LAD,PTCA to diagonal, and DES to RCA LV gram LVEF 60%   . CARDIAC SURGERY    . COLONOSCOPY    . COLONOSCOPY  05/14/2011   Procedure: COLONOSCOPY;  Surgeon: Daneil Dolin, MD;  [facility];  Service: Endoscopy;  Laterality: N/A;  8:15 AM  . LEFT HEART CATH AND CORONARY ANGIOGRAPHY N/A 08/14/2017   Procedure: LEFT HEART CATH AND CORONARY ANGIOGRAPHY;  Surgeon: Martinique, Peter M, MD;  Location: [facility];  Service: Cardiovascular;  Laterality: N/A;    There were no vitals filed for this visit.  Subjective Assessment - 11/01/18 1447    Subjective   S: It only hurts first thing in the morning.    Currently in Pain?  No/denies         [facility]  OT Assessment - 11/01/18 1447      Assessment   Medical Diagnosis  Right Shoulder Bursitis      Precautions   Precautions  None               OT Treatments/Exercises (OP) - 11/01/18 0001      Exercises   Exercises  Shoulder      Shoulder Exercises: Supine   Protraction  PROM;5 reps    Horizontal ABduction  PROM;5 reps    External Rotation  PROM;5 reps    Internal Rotation  PROM;5 reps    Flexion  PROM;5 reps    ABduction  PROM;5 reps      Shoulder Exercises: Standing   Other Standing Exercises  Red band loop: wall slide Y lift off 10X; wall walks: 10X; wall clock: 2,3, 4 o'clock 10X each      Other Standing Exercises  Body Craft: chest press: 30#; 12X.       Shoulder Exercises: ROM/Strengthening   UBE (Upper Arm Bike)  Level 7 3' reverse only   pace: 11.0-12.0   Over Head Lace  2" seated with 3# wrist weight    X to V Arms  10X with  3#    Proximal Shoulder Strengthening, Seated  10X with 3# no rest breaks    Ball on Wall  1' abduction, 1' flexion green ball    Other ROM/Strengthening Exercises  Arnold press; 10X; 3#    Other ROM/Strengthening Exercises  Bodycraft pulleys: 40# row, retraction, extension. 30# external rotation, protraction, horizontal abduction 10X for each.               OT Short Term Goals - 10/15/18 1005      OT SHORT TERM GOAL #1   Title  Patient will be educated on a HEP for improved right upper extremity use.     Time  4    Period  Weeks    Target Date  10/07/18      OT SHORT TERM GOAL #2   Title  Patient will decrease pain in his right shoulder to 5/10 or less when threading belt through beltloops.     Time  4    Period  Weeks    Status  On-going      OT SHORT TERM GOAL #3   Title  Patient will improve right shoulder A/ROM to [facility] for improved ability to reach overhead and behind his back.     Time  4    Period  Weeks    Status  Partially Met      OT SHORT TERM GOAL #4   Title  Patient will decrease fascial restrictions  from moderate to min-moderate for improved functional use of his right arm with less pain.     Time  4        OT Long Term Goals - 10/15/18 1005      OT LONG TERM GOAL #1   Title  Patient will use his right arm as dominant with all functional and leisure activities without pain.     Time  8    Period  Weeks    Status  On-going      OT LONG TERM GOAL #2   Title  Patient will have 2/10 pain or less when lying on his right side.     Time  8    Period  Weeks    Status  On-going      OT LONG TERM GOAL #3   Title  Patient will improve right shoulder A/ROM to WNL for improved ability to reach overhead and scratch his back without pain.     Time  8    Period  Weeks    Status  On-going      OT LONG TERM GOAL #4   Title  Patient will have 5/5 strength in his right shoulder without pain in order to lift car parts in his shop.     Time  8    Period  Weeks      OT LONG TERM GOAL #5   Title  Patient will decrease fascial restrictions to minimal or less in his right shoulder in order to use his right arm with pain.      Time  8    Period  Weeks    Status  On-going            Plan - 11/01/18 1512    Clinical Impression Statement  A: Session focused on shoulder strength and scapular stability. patient continues to have increased difficulty with body awareness when completing push ups with proper body positioning. Pt was unable to complete modified push ups at the countertop with better body  positioning.    Body Structure / Function / Physical Skills  ADL;UE functional use;Fascial restriction;Pain;ROM;IADL;Strength    Plan  P: reassess for possible discharge    Consulted and Agree with Plan of Care  Patient       Patient will benefit from skilled therapeutic intervention in order to improve the following deficits and impairments:   Body Structure / Function / Physical Skills: ADL, UE functional use, Fascial restriction, Pain, ROM, IADL, Strength       Visit Diagnosis: 1.  Stiffness of right shoulder, not elsewhere classified   2. Acute pain of right shoulder       Problem List Patient Active Problem List   Diagnosis Date Noted  . Angina pectoris ([place]) 08/19/2015  . Hyperglycemia 04/27/2014  . Gout   . Hypertension   . Cardiovascular disease 08/26/2009  . GASTROESOPHAGEAL REFLUX DISEASE 08/26/2009  . Hypercholesterolemia 01/15/2009  . RHINITIS, CHRONIC 01/15/2009   Ailene Ravel, OTR/L,CBIS  [phone]  11/01/2018, 3:33 PM  [facility] [address] [place], [place], [address] Phone: [phone]   Fax:  [phone]  Name: Dennis [name] MRN: [id number] Date of Birth: 1951/03/09

## 2018-11-04 ENCOUNTER — Ambulatory Visit (HOSPITAL_COMMUNITY): Payer: Medicare HMO

## 2018-11-04 ENCOUNTER — Ambulatory Visit (INDEPENDENT_AMBULATORY_CARE_PROVIDER_SITE_OTHER): Payer: Medicare HMO | Admitting: Otolaryngology

## 2018-11-04 DIAGNOSIS — L570 Actinic keratosis: Secondary | ICD-10-CM | POA: Diagnosis not present

## 2018-11-04 DIAGNOSIS — D225 Melanocytic nevi of trunk: Secondary | ICD-10-CM | POA: Diagnosis not present

## 2018-11-04 DIAGNOSIS — H903 Sensorineural hearing loss, bilateral: Secondary | ICD-10-CM | POA: Diagnosis not present

## 2018-11-04 DIAGNOSIS — X32XXXA Exposure to sunlight, initial encounter: Secondary | ICD-10-CM | POA: Diagnosis not present

## 2018-11-05 ENCOUNTER — Other Ambulatory Visit: Payer: Self-pay

## 2018-11-05 ENCOUNTER — Ambulatory Visit (HOSPITAL_COMMUNITY): Payer: Medicare HMO

## 2018-11-05 ENCOUNTER — Encounter (HOSPITAL_COMMUNITY): Payer: Self-pay

## 2018-11-05 DIAGNOSIS — M25511 Pain in right shoulder: Secondary | ICD-10-CM

## 2018-11-05 DIAGNOSIS — M25611 Stiffness of right shoulder, not elsewhere classified: Secondary | ICD-10-CM | POA: Diagnosis not present

## 2018-11-05 NOTE — Patient Instructions (Signed)
(  Home) Extension: Isometric / Bilateral Arm Retraction - Sitting   Facing anchor, hold hands and elbow at shoulder height, with elbow bent.  Pull arms back to squeeze shoulder blades together. Repeat 10-15 times. 1-3 times/day.   (Clinic) Extension / Flexion (Assist)   Face anchor, pull arms back, keeping elbow straight, and squeze shoulder blades together. Repeat 10-15 times. 1-3 times/day.   Strengthening: Chest Pull - Resisted   Hold Theraband in front of body with hands about shoulder width a part. Pull band a part and back together slowly. Repeat __10-15__ times. Complete ____ set(s) per session.. Repeat __1__ session(s) per day.  http://orth.exer.us/926   Copyright  VHI. All rights reserved.   PNF Strengthening: Resisted   Standing with resistive band around each hand, bring right arm up and away, thumb back. Repeat _10-15___ times per set. Do ____ sets per session. Do __1__ sessions per day.                Resisted External Rotation: in Neutral - Bilateral   Sit or stand, tubing in both hands, elbows at sides, bent to 90, forearms forward. Pinch shoulder blades together and rotate forearms out. Keep elbows at sides. Repeat _10-15___ times per set. Do ____ sets per session. Do ___1_ sessions per day.  http://orth.exer.us/966   Copyright  VHI. All rights reserved.   PNF Strengthening: Resisted   Standing, hold resistive band above head. Bring right arm down and out from side. Repeat _10-15___ times per set. Do ____ sets per session. Do __1__ sessions per day.  http://orth.exer.us/922   Copyright  VHI. All rights reserved.

## 2018-11-05 NOTE — Therapy (Signed)
[facility] [address] [place], [place], [address] Phone: [phone]   Fax:  [phone]  Occupational Therapy Treatment Reassessment and discharge Patient Details  Name: Dennis [name] MRN: [id number] Date of Birth: [birth year] Referring Provider (OT): Dr. Nicholes Stairs   Encounter Date: 11/05/2018  OT End of Session - 11/05/18 0843    Visit Number  16    Number of Visits  16    Authorization Type  [employer] Medicare, no visit limit     OT Start Time  0820   reassess and discharge   OT Stop Time  0855    OT Time Calculation (min)  35 min    Activity Tolerance  Patient tolerated treatment well    Behavior During Therapy  [facility] for tasks assessed/performed       Past Medical History:  Diagnosis Date  . Coronary artery disease    a. s/p DES to LAD with DES to RCA, and PTCA to D1 in 06/2007 b. 08/2017: cath showing patent stents with 70% D1, 50% LCx, 60% mid-RCA, and 60% Acute Mrg stenosis. Medical therapy recommended.   Marland Kitchen GERD (gastroesophageal reflux disease)   . Gout   . Hypercholesteremia   . Hypertension   . Myocardial infarction ([place]) 1998    Past Surgical History:  Procedure Laterality Date  . bilateral eye surgery    . CARDIAC CATHETERIZATION  2009   PCI with DES to LAD,PTCA to diagonal, and DES to RCA LV gram LVEF 60%   . CARDIAC SURGERY    . COLONOSCOPY    . COLONOSCOPY  05/14/2011   Procedure: COLONOSCOPY;  Surgeon: Daneil Dolin, MD;  [facility];  Service: Endoscopy;  Laterality: N/A;  8:15 AM  . LEFT HEART CATH AND CORONARY ANGIOGRAPHY N/A 08/14/2017   Procedure: LEFT HEART CATH AND CORONARY ANGIOGRAPHY;  Surgeon: Martinique, Peter M, MD;  Location: [facility];  Service: Cardiovascular;  Laterality: N/A;    There were no vitals filed for this visit.  Subjective Assessment - 11/05/18 0824    Subjective   S: Sometimes I still have difficulty reaching up or behind.    Currently in Pain?   No/denies         [facility] OT Assessment - 11/05/18 0824      Assessment   Medical Diagnosis  Right Shoulder Bursitis      Precautions   Precautions  None      Observation/Other Assessments   Focus on Therapeutic Outcomes (FOTO)   72/100      ROM / Strength   AROM / PROM / Strength  AROM;PROM;Strength      Palpation   Palpation comment  trace fascial restrictions in right upper arm, trapezius and scapularis region.       AROM   Overall AROM Comments  assessed in seated, external and internal rotation with shoulder adducted    AROM Assessment Site  Shoulder    Right/Left Shoulder  Right    Right Shoulder Flexion  170 Degrees   previous: 155   Right Shoulder ABduction  170 Degrees   previous: 165   Right Shoulder Internal Rotation  90 Degrees   previous: same   Right Shoulder External Rotation  90 Degrees   previous: same     PROM   Overall PROM Comments  WFL in supine      Strength   Overall Strength Comments  assessed in seated, external and internal rotation with shoulder  adducted    Strength Assessment Site  Shoulder    Right/Left Shoulder  Right    Right Shoulder Flexion  5/5   previous: same   Right Shoulder ABduction  5/5   previous: same   Right Shoulder Internal Rotation  5/5   previous: 4+/5   Right Shoulder External Rotation  5/5   previous: same              OT Treatments/Exercises (OP) - 11/05/18 0825      Exercises   Exercises  Shoulder      Shoulder Exercises: Supine   Protraction  PROM;5 reps    Horizontal ABduction  PROM;5 reps    External Rotation  PROM;5 reps    Internal Rotation  PROM;5 reps    Flexion  PROM;5 reps    ABduction  PROM;5 reps      Shoulder Exercises: Seated   Horizontal ABduction  Theraband;10 reps    Theraband Level (Shoulder Horizontal ABduction)  Level 3 (Green)    External Rotation  Theraband;10 reps    Theraband Level (Shoulder External Rotation)  Level 3 (Green)    Internal Rotation  Theraband;10 reps     Theraband Level (Shoulder Internal Rotation)  Level 3 (Green)    Abduction  Theraband;10 reps    Theraband Level (Shoulder ABduction)  Level 3 (Green)    Diagonals  Theraband;10 reps    Theraband Level (Shoulder Diagonals)  Level 3 Nyoka Cowden)             OT Education - 11/05/18 3474    Education Details  Updated HEP. Provided patient with shoulder strengthening with green theraband and provided blue for patient to progress to. Continue to complete retraction and extension for scapular strengthening.    Person(s) Educated  Patient    Methods  Explanation;Demonstration;Verbal cues;Handout    Comprehension  Verbalized understanding;Returned demonstration       OT Short Term Goals - 11/05/18 2595      OT SHORT TERM GOAL #1   Title  Patient will be educated on a HEP for improved right upper extremity use.     Time  4    Period  Weeks    Target Date  10/07/18      OT SHORT TERM GOAL #2   Title  Patient will decrease pain in his right shoulder to 5/10 or less when threading belt through beltloops.     Time  4    Period  Weeks    Status  Achieved      OT SHORT TERM GOAL #3   Title  Patient will improve right shoulder A/ROM to [facility] for improved ability to reach overhead and behind his back.     Time  4    Period  Weeks    Status  Achieved      OT SHORT TERM GOAL #4   Title  Patient will decrease fascial restrictions from moderate to min-moderate for improved functional use of his right arm with less pain.     Time  4        OT Long Term Goals - 10/15/18 1005      OT LONG TERM GOAL #1   Title  Patient will use his right arm as dominant with all functional and leisure activities without pain.     Time  8    Period  Weeks    Status  On-going      OT LONG TERM GOAL #2   Title  Patient will have 2/10 pain or less when lying on his right side.     Time  8    Period  Weeks    Status  On-going      OT LONG TERM GOAL #3   Title  Patient will improve right shoulder A/ROM  to WNL for improved ability to reach overhead and scratch his back without pain.     Time  8    Period  Weeks    Status  On-going      OT LONG TERM GOAL #4   Title  Patient will have 5/5 strength in his right shoulder without pain in order to lift car parts in his shop.     Time  8    Period  Weeks      OT LONG TERM GOAL #5   Title  Patient will decrease fascial restrictions to minimal or less in his right shoulder in order to use his right arm with pain.      Time  8    Period  Weeks    Status  On-going            Plan - 11/05/18 0340    Clinical Impression Statement  A: Reassessment completed this date. patient has met all therapy goals this date. he reports that his ROM is better, his pain is less severe, and he feels stronger when he completing daily tasks. He will experience shoulder stiffness in the morning and occassionally when reaching overhead or behind his back. He was provided with an updated HEP and is independent with completion. Discharge from therapy was recommended and patient was in agreement.    Body Structure / Function / Physical Skills  ADL;UE functional use;Fascial restriction;Pain;ROM;IADL;Strength    Plan  P: Discharge from OT services with HEP.    Consulted and Agree with Plan of Care  Patient       Patient will benefit from skilled therapeutic intervention in order to improve the following deficits and impairments:   Body Structure / Function / Physical Skills: ADL, UE functional use, Fascial restriction, Pain, ROM, IADL, Strength       Visit Diagnosis: 1. Stiffness of right shoulder, not elsewhere classified   2. Acute pain of right shoulder       Problem List Patient Active Problem List   Diagnosis Date Noted  . Angina pectoris ([place]) 08/19/2015  . Hyperglycemia 04/27/2014  . Gout   . Hypertension   . Cardiovascular disease 08/26/2009  . GASTROESOPHAGEAL REFLUX DISEASE 08/26/2009  . Hypercholesterolemia 01/15/2009  . RHINITIS, CHRONIC  01/15/2009   OCCUPATIONAL THERAPY DISCHARGE SUMMARY  Visits from Start of Care: 16  Current functional level related to goals / functional outcomes: See above   Remaining deficits: See above   Education / Equipment: See above Plan: Patient agrees to discharge.  Patient goals were met. Patient is being discharged due to meeting the stated rehab goals.  ?????         Ailene Ravel, OTR/L,CBIS  [phone]  11/05/2018, 9:20 AM  [facility] [address] [place], [place], [address] Phone: [phone]   Fax:  [phone]  Name: Dennis [name] MRN: [id number] Date of Birth: March 03, 1951

## 2018-11-11 ENCOUNTER — Encounter: Payer: Self-pay | Admitting: Cardiology

## 2018-11-11 ENCOUNTER — Ambulatory Visit: Payer: Medicare HMO | Admitting: Cardiology

## 2018-11-11 ENCOUNTER — Encounter: Payer: Self-pay | Admitting: Internal Medicine

## 2018-11-11 ENCOUNTER — Other Ambulatory Visit: Payer: Self-pay

## 2018-11-11 VITALS — BP 113/70 | HR 74 | Temp 96.8°F | Ht 66.0 in | Wt 192.0 lb

## 2018-11-11 DIAGNOSIS — E782 Mixed hyperlipidemia: Secondary | ICD-10-CM | POA: Diagnosis not present

## 2018-11-11 DIAGNOSIS — I2510 Atherosclerotic heart disease of native coronary artery without angina pectoris: Secondary | ICD-10-CM | POA: Diagnosis not present

## 2018-11-11 DIAGNOSIS — I10 Essential (primary) hypertension: Secondary | ICD-10-CM | POA: Diagnosis not present

## 2018-11-11 NOTE — Patient Instructions (Signed)

## 2018-11-11 NOTE — Progress Notes (Signed)
Clinical Summary [name] is a 68 y.o.male seen today for follow up of the following medical problems.    1. CAD  - cath 06/2007 in the setting of unstable angina, PCI with DES to LAD,PTCA to diagonal, and DES to RCA LV gram LVEF 60%  - 01/2013 Stress MPI low risk study, no ischemia - 01/2013 echo LVEF 60-65%, grade II diastolic dysfunction - 08/352 exercise nuclear stress test: no ischemia    08/2017 cath moderate CAD as reported below, patent stents   - no recent chest pain. No SOB or DOE. - compliant with meds  2. Dizziness - orthostatic at prior clinic visit. Symptoms have improved off HCTZ and with increaesd hydration. - no recent issues   3. HTN - compliant with meds  4. Hyperlipidemia - prior mylagias on lipitor 20 mg daily,he had done well onlipitor 10mg  daily.  - reports recent labs with pcp    5. Right shoulder bursitis - has been working with PT     SH: often travels to [place]. Recent traveled to [place]. Enjoys classic cars. He was good friends with a patient of mine who recently passed away Dennis [name]. He still remains in touch with his wife Dennis [name] who is also a patient of mine.   Has place [place] he often visit  Past Medical History:  Diagnosis Date  . Coronary artery disease    a. s/p DES to LAD with DES to RCA, and PTCA to D1 in 06/2007 b. 08/2017: cath showing patent stents with 70% D1, 50% LCx, 60% mid-RCA, and 60% Acute Mrg stenosis. Medical therapy recommended.   Marland Kitchen GERD (gastroesophageal reflux disease)   . Gout   . Hypercholesteremia   . Hypertension   . Myocardial infarction ([place]) 1998     No Known Allergies   Current Outpatient Medications  Medication Sig Dispense Refill  . acetaminophen (TYLENOL) 500 MG tablet Take 1,000 mg by mouth every 6 (six) hours as needed for mild pain or headache.     Marland Kitchen aspirin (ASPIRIN EC) 81 MG EC tablet Take 81 mg by mouth at bedtime.     Marland Kitchen  atorvastatin (LIPITOR) 10 MG tablet Take 10 mg by mouth at bedtime.   1  . carboxymethylcellulose (REFRESH TEARS) 0.5 % SOLN Place 1 drop into both eyes daily as needed (for dry eyes).    . fluticasone (FLONASE) 50 MCG/ACT nasal spray Place 1 spray into both nostrils daily as needed for allergies or rhinitis.     Marland Kitchen isosorbide mononitrate (IMDUR) 60 MG 24 hr tablet Take 1 tablet (60 mg total) by mouth at bedtime. 90 tablet 3  . losartan (COZAAR) 100 MG tablet Take 100 mg by mouth daily.  1  . pantoprazole (PROTONIX) 40 MG tablet     . potassium bicarbonate (K-LYTE) 25 MEQ disintegrating tablet Take 25 mEq by mouth 2 (two) times daily.    . tamsulosin (FLOMAX) 0.4 MG CAPS capsule Take 0.4 mg by mouth daily.     . verapamil (CALAN-SR) 180 MG CR tablet Take 180 mg by mouth every morning.     . vitamin B-12 (CYANOCOBALAMIN) 500 MCG tablet Take 500 mcg by mouth daily.     No current facility-administered medications for this visit.      Past Surgical History:  Procedure Laterality Date  . bilateral eye surgery    . CARDIAC CATHETERIZATION  2009   PCI with DES to LAD,PTCA to diagonal, and DES to RCA  LV gram LVEF 60%   . CARDIAC SURGERY    . COLONOSCOPY    . COLONOSCOPY  05/14/2011   Procedure: COLONOSCOPY;  Surgeon: Daneil Dolin, MD;  [facility];  Service: Endoscopy;  Laterality: N/A;  8:15 AM  . LEFT HEART CATH AND CORONARY ANGIOGRAPHY N/A 08/14/2017   Procedure: LEFT HEART CATH AND CORONARY ANGIOGRAPHY;  Surgeon: Martinique, Peter M, MD;  Location: [facility];  Service: Cardiovascular;  Laterality: N/A;     No Known Allergies    Family History  Problem Relation Age of Onset  . Stroke Mother   . Heart attack Father   . Colon cancer Neg Hx      Social History [name] reports that he has never smoked. He has never used smokeless tobacco. [name] reports current alcohol use of about 5.0 standard drinks of alcohol per week.   Review of  Systems CONSTITUTIONAL: No weight loss, fever, chills, weakness or fatigue.  HEENT: Eyes: No visual loss, blurred vision, double vision or yellow sclerae.No hearing loss, sneezing, congestion, runny nose or sore throat.  SKIN: No rash or itching.  CARDIOVASCULAR: per hpi RESPIRATORY: No shortness of breath, cough or sputum.  GASTROINTESTINAL: No anorexia, nausea, vomiting or diarrhea. No abdominal pain or blood.  GENITOURINARY: No burning on urination, no polyuria NEUROLOGICAL: No headache, dizziness, syncope, paralysis, ataxia, numbness or tingling in the extremities. No change in bowel or bladder control.  MUSCULOSKELETAL: No muscle, back pain, joint pain or stiffness.  LYMPHATICS: No enlarged nodes. No history of splenectomy.  PSYCHIATRIC: No history of depression or anxiety.  ENDOCRINOLOGIC: No reports of sweating, cold or heat intolerance. No polyuria or polydipsia.  Marland Kitchen   Physical Examination Today's Vitals   11/11/18 0937  BP: 113/70  Pulse: 74  Temp: (!) 96.8 F (36 C)  TempSrc: Temporal  SpO2: 97%  Weight: 192 lb (87.1 kg)  Height: 5\' 6"  (1.676 m)   Body mass index is 30.99 kg/m.  Gen: resting comfortably, no acute distress HEENT: no scleral icterus, pupils equal round and reactive, no palptable cervical adenopathy,  CV: RRR, no m/r/g, no jvd Resp: Clear to auscultation bilaterally GI: abdomen is soft, non-tender, non-distended, normal bowel sounds, no hepatosplenomegaly MSK: extremities are warm, no edema.  Skin: warm, no rash Neuro:  no focal deficits Psych: appropriate affect   Diagnostic Studies  01/2013 Stress MPI Analysis of the raw perfusion data finds radiotracer uptake in the gut adjacent to the inferior wall.  Tomographic views were obtained using the short axis, vertical long axis, and horizontal long axis planes. There is a small, mild to moderate intensity, inferior perfusion defect that is fixed. No significant reversible defects to indicate  ischemia.  Gated imaging reveals an EDV of 70, ESV of 24, normal TID ratio of 0.89, and LVEF of 65% without wall motion abnormality.  IMPRESSION: Low risk exercise Cardiolite. No chest pain reported, there were no diagnostic ST segment changes at maximum work load of 10.1 METS. Perfusion imaging is most consistent with inferior wall attenuation, no definite scar or ischemic defects. LV volumes are normal with LVEF 65% and no focal wall motion abnormalities.  01/2013 Echo Study Conclusions  - Left ventricle: The cavity size was normal. There was mild concentric hypertrophy. Systolic function was normal. The estimated ejection fraction was in the range of 60% to 65%. Wall motion was normal; there were no regional wall motion abnormalities. Features are consistent with a pseudonormal left ventricular filling pattern, with  concomitant abnormal relaxation and increased filling pressure (grade 2 diastolic dysfunction). - Mitral valve: Trivial regurgitation. - Left atrium: The atrium was at the upper limits of normal in size. - Tricuspid valve: Physiologic regurgitation. - Pulmonary arteries: Systolic pressure could not be accurately estimated. - Inferior vena cava: Poorly visualized. Unable to estimate CVP. - Pericardium, extracardiac: There was no pericardial effusion. Impressions:  - Comparison to prior study March 2009. Mild LVH with LVEF 62-83%, grade 2 diastolic dysfunction. Upper normal left atrial size. Trivial mitral and tricuspid regurgitation. Unable to assess PASP or CVP.   08/2015 MPI  There was no ST segment deviation noted during stress.  Findings consistent with prior inferior myocardial infarction. There is no current ischemia  This is a low risk study.  The left ventricular ejection fraction is hyperdynamic (>65%).  Duke treadmill score of 5, consistent with low risk for major cardiac events  08/2017 cath  Previously  placed Prox LAD to Mid LAD stent (unknown type) is widely patent.  1st Diag lesion is 70% stenosed.  Mid Cx to Dist Cx lesion is 50% stenosed.  Previously placed Prox RCA to Mid RCA stent (unknown type) is widely patent.  Prox RCA lesion is 40% stenosed.  Acute Mrg lesion is 60% stenosed.  Mid RCA lesion is 60% stenosed.  The left ventricular systolic function is normal.  LV end diastolic pressure is normal.  The left ventricular ejection fraction is 55-65% by visual estimate.  1. Modest 3 vessel CAD - 70% first diagonal - 50% long distal LCx - 60% mid RCA and RV marginal Braydin Aloi 2. Continued excellent patency of stents in the LAD and RCA 3. Normal LV function  Plan: I do not see any critical disease that would explain his resting chest pain. The first diagonal was severely stenosed post stenting of the LAD and this has improved. I would recommend continued medical therapy   Assessment and Plan  1. CAD -no symptoms, continue current meds  2. HTN - bp at goal, continue current meds   3. Hyperlipideima - request labs from pcp, continue statin. Did not tolerate higher doses of statin.         Arnoldo Lenis, M.D.

## 2018-11-12 ENCOUNTER — Ambulatory Visit (HOSPITAL_COMMUNITY): Payer: Medicare HMO

## 2018-12-21 DIAGNOSIS — Z23 Encounter for immunization: Secondary | ICD-10-CM | POA: Diagnosis not present

## 2018-12-21 DIAGNOSIS — B356 Tinea cruris: Secondary | ICD-10-CM | POA: Diagnosis not present

## 2019-01-11 DIAGNOSIS — I2510 Atherosclerotic heart disease of native coronary artery without angina pectoris: Secondary | ICD-10-CM | POA: Diagnosis not present

## 2019-01-11 DIAGNOSIS — I10 Essential (primary) hypertension: Secondary | ICD-10-CM | POA: Diagnosis not present

## 2019-01-18 DIAGNOSIS — H47323 Drusen of optic disc, bilateral: Secondary | ICD-10-CM | POA: Diagnosis not present

## 2019-01-18 DIAGNOSIS — H25013 Cortical age-related cataract, bilateral: Secondary | ICD-10-CM | POA: Diagnosis not present

## 2019-01-18 DIAGNOSIS — H2513 Age-related nuclear cataract, bilateral: Secondary | ICD-10-CM | POA: Diagnosis not present

## 2019-01-20 DIAGNOSIS — N3000 Acute cystitis without hematuria: Secondary | ICD-10-CM | POA: Diagnosis not present

## 2019-01-31 DIAGNOSIS — H2511 Age-related nuclear cataract, right eye: Secondary | ICD-10-CM | POA: Diagnosis not present

## 2019-01-31 DIAGNOSIS — H25013 Cortical age-related cataract, bilateral: Secondary | ICD-10-CM | POA: Diagnosis not present

## 2019-01-31 DIAGNOSIS — H25043 Posterior subcapsular polar age-related cataract, bilateral: Secondary | ICD-10-CM | POA: Diagnosis not present

## 2019-01-31 DIAGNOSIS — I10 Essential (primary) hypertension: Secondary | ICD-10-CM | POA: Diagnosis not present

## 2019-01-31 DIAGNOSIS — H2513 Age-related nuclear cataract, bilateral: Secondary | ICD-10-CM | POA: Diagnosis not present

## 2019-02-15 DIAGNOSIS — H25811 Combined forms of age-related cataract, right eye: Secondary | ICD-10-CM | POA: Diagnosis not present

## 2019-02-15 DIAGNOSIS — H2511 Age-related nuclear cataract, right eye: Secondary | ICD-10-CM | POA: Diagnosis not present

## 2019-02-22 DIAGNOSIS — S61011A Laceration without foreign body of right thumb without damage to nail, initial encounter: Secondary | ICD-10-CM | POA: Diagnosis not present

## 2019-04-27 ENCOUNTER — Ambulatory Visit: Payer: Medicare Other | Attending: Internal Medicine

## 2019-04-27 DIAGNOSIS — Z23 Encounter for immunization: Secondary | ICD-10-CM | POA: Insufficient documentation

## 2019-04-27 NOTE — Progress Notes (Signed)
   Covid-19 Vaccination Clinic  Name:  Dennis [name]    MRN: [id number] DOB: [birth year]  04/27/2019  [name] was observed post Covid-19 immunization for 15 minutes without incidence. He was provided with Vaccine Information Sheet and instruction to access the V-Safe system.   [name] was instructed to call 911 with any severe reactions post vaccine: Marland Kitchen Difficulty breathing  . Swelling of your face and throat  . A fast heartbeat  . A bad rash all over your body  . Dizziness and weakness    Immunizations Administered    Name Date Dose VIS Date Route   Pfizer COVID-19 Vaccine 04/27/2019 10:35 AM 0.3 mL 03/18/2019 Intramuscular   Manufacturer: [place]   Lot: [id number]   [place]: [id number]

## 2019-05-10 ENCOUNTER — Ambulatory Visit: Payer: Medicare Other | Admitting: Cardiology

## 2019-05-10 ENCOUNTER — Other Ambulatory Visit: Payer: Self-pay

## 2019-05-10 ENCOUNTER — Encounter: Payer: Self-pay | Admitting: Cardiology

## 2019-05-10 VITALS — BP 138/89 | HR 74 | Temp 97.5°F | Ht 66.0 in | Wt 188.0 lb

## 2019-05-10 DIAGNOSIS — I2510 Atherosclerotic heart disease of native coronary artery without angina pectoris: Secondary | ICD-10-CM | POA: Diagnosis not present

## 2019-05-10 DIAGNOSIS — I10 Essential (primary) hypertension: Secondary | ICD-10-CM

## 2019-05-10 DIAGNOSIS — E782 Mixed hyperlipidemia: Secondary | ICD-10-CM

## 2019-05-10 NOTE — Patient Instructions (Signed)

## 2019-05-10 NOTE — Progress Notes (Signed)
Clinical Summary Dennis [name] is a 69 y.o.male seen today for follow up of the following medical problems.    1. CAD  - cath 06/2007 in the setting of unstable angina, PCI with DES to LAD,PTCA to diagonal, and DES to RCA LV gram LVEF 60%  - 01/2013 Stress MPI low risk study, no ischemia - 01/2013 echo LVEF 60-65%, grade II diastolic dysfunction - 123456 exercise nuclear stress test: no ischemia    08/2017 cath moderate CAD as reported below, patent stents  - no recent chest pain - no sob or doe - compliant with meds.   2. Dizziness - orthostaticat prior clinic visit. Symptoms have improved off HCTZ and with increaesd hydration. - no recent issues  - no recent symptoms.    3. HTN - he is compliant with meds  4. Hyperlipidemia - prior mylagias on lipitor 20 mg daily,he had done well onlipitor 10mg  daily.   06/2018 TC 133 TG 207 HDL 36 LDL 56 - compliant with statin       SH: often travels to [place]. Recent traveled to [place]. Enjoys classic cars. He was good friends with a patient of mine who recently passed away Dennis [name]. He still remains in touch with his wife Dennis [name] who is also a patient of mine.   Has place [place] he often visit   Past Medical History:  Diagnosis Date  . Coronary artery disease    a. s/p DES to LAD with DES to RCA, and PTCA to D1 in 06/2007 b. 08/2017: cath showing patent stents with 70% D1, 50% LCx, 60% mid-RCA, and 60% Acute Mrg stenosis. Medical therapy recommended.   Marland Kitchen GERD (gastroesophageal reflux disease)   . Gout   . Hypercholesteremia   . Hypertension   . Myocardial infarction ([place]) 1998     No Known Allergies   Current Outpatient Medications  Medication Sig Dispense Refill  . acetaminophen (TYLENOL) 500 MG tablet Take 1,000 mg by mouth every 6 (six) hours as needed for mild pain or headache.     Marland Kitchen aspirin (ASPIRIN EC) 81 MG EC tablet Take 81 mg by mouth at  bedtime.     Marland Kitchen atorvastatin (LIPITOR) 10 MG tablet Take 10 mg by mouth at bedtime.   1  . carboxymethylcellulose (REFRESH TEARS) 0.5 % SOLN Place 1 drop into both eyes daily as needed (for dry eyes).    . fluticasone (FLONASE) 50 MCG/ACT nasal spray Place 1 spray into both nostrils daily as needed for allergies or rhinitis.     Marland Kitchen isosorbide mononitrate (IMDUR) 60 MG 24 hr tablet Take 1 tablet (60 mg total) by mouth at bedtime. 90 tablet 3  . losartan (COZAAR) 100 MG tablet Take 100 mg by mouth daily.  1  . pantoprazole (PROTONIX) 40 MG tablet     . potassium bicarbonate (K-LYTE) 25 MEQ disintegrating tablet Take 25 mEq by mouth 2 (two) times daily.    . tamsulosin (FLOMAX) 0.4 MG CAPS capsule Take 0.4 mg by mouth daily.     . verapamil (CALAN-SR) 180 MG CR tablet Take 180 mg by mouth every morning.     . vitamin B-12 (CYANOCOBALAMIN) 500 MCG tablet Take 500 mcg by mouth daily.     No current facility-administered medications for this visit.     Past Surgical History:  Procedure Laterality Date  . bilateral eye surgery    . CARDIAC CATHETERIZATION  2009   PCI with DES to LAD,PTCA to  diagonal, and DES to RCA LV gram LVEF 60%   . CARDIAC SURGERY    . COLONOSCOPY    . COLONOSCOPY  05/14/2011   Procedure: COLONOSCOPY;  Surgeon: Daneil Dolin, MD;  [facility];  Service: Endoscopy;  Laterality: N/A;  8:15 AM  . LEFT HEART CATH AND CORONARY ANGIOGRAPHY N/A 08/14/2017   Procedure: LEFT HEART CATH AND CORONARY ANGIOGRAPHY;  Surgeon: Martinique, Peter M, MD;  Location: [facility];  Service: Cardiovascular;  Laterality: N/A;     No Known Allergies    Family History  Problem Relation Age of Onset  . Stroke Mother   . Heart attack Father   . Colon cancer Neg Hx      Social History Dennis [name] reports that he has never smoked. He has never used smokeless tobacco. Dennis [name] reports current alcohol use of about 5.0 standard drinks of alcohol per week.   Review of  Systems CONSTITUTIONAL: No weight loss, fever, chills, weakness or fatigue.  HEENT: Eyes: No visual loss, blurred vision, double vision or yellow sclerae.No hearing loss, sneezing, congestion, runny nose or sore throat.  SKIN: No rash or itching.  CARDIOVASCULAR: per hpi RESPIRATORY: No shortness of breath, cough or sputum.  GASTROINTESTINAL: No anorexia, nausea, vomiting or diarrhea. No abdominal pain or blood.  GENITOURINARY: No burning on urination, no polyuria NEUROLOGICAL: No headache, dizziness, syncope, paralysis, ataxia, numbness or tingling in the extremities. No change in bowel or bladder control.  MUSCULOSKELETAL: No muscle, back pain, joint pain or stiffness.  LYMPHATICS: No enlarged nodes. No history of splenectomy.  PSYCHIATRIC: No history of depression or anxiety.  ENDOCRINOLOGIC: No reports of sweating, cold or heat intolerance. No polyuria or polydipsia.  Marland Kitchen   Physical Examination Today's Vitals   05/10/19 0806  BP: 138/89  Pulse: 74  Temp: (!) 97.5 F (36.4 C)  Weight: 188 lb (85.3 kg)  Height: 5\' 6"  (1.676 m)   Body mass index is 30.34 kg/m.  Gen: resting comfortably, no acute distress HEENT: no scleral icterus, pupils equal round and reactive, no palptable cervical adenopathy,  CV Resp: Clear to auscultation bilaterally GI: abdomen is soft, non-tender, non-distended, normal bowel sounds, no hepatosplenomegaly MSK: extremities are warm, no edema.  Skin: warm, no rash Neuro:  no focal deficits Psych: appropriate affect   Diagnostic Studies 01/2013 Stress MPI Analysis of the raw perfusion data finds radiotracer uptake in the gut adjacent to the inferior wall.  Tomographic views were obtained using the short axis, vertical long axis, and horizontal long axis planes. There is a small, mild to moderate intensity, inferior perfusion defect that is fixed. No significant reversible defects to indicate ischemia.  Gated imaging reveals an EDV of 70, ESV of  24, normal TID ratio of 0.89, and LVEF of 65% without wall motion abnormality.  IMPRESSION: Low risk exercise Cardiolite. No chest pain reported, there were no diagnostic ST segment changes at maximum work load of 10.1 METS. Perfusion imaging is most consistent with inferior wall attenuation, no definite scar or ischemic defects. LV volumes are normal with LVEF 65% and no focal wall motion abnormalities.  01/2013 Echo Study Conclusions  - Left ventricle: The cavity size was normal. There was mild concentric hypertrophy. Systolic function was normal. The estimated ejection fraction was in the range of 60% to 65%. Wall motion was normal; there were no regional wall motion abnormalities. Features are consistent with a pseudonormal left ventricular filling pattern, with concomitant abnormal relaxation and increased filling pressure (  grade 2 diastolic dysfunction). - Mitral valve: Trivial regurgitation. - Left atrium: The atrium was at the upper limits of normal in size. - Tricuspid valve: Physiologic regurgitation. - Pulmonary arteries: Systolic pressure could not be accurately estimated. - Inferior vena cava: Poorly visualized. Unable to estimate CVP. - Pericardium, extracardiac: There was no pericardial effusion. Impressions:  - Comparison to prior study March 2009. Mild LVH with LVEF 123456, grade 2 diastolic dysfunction. Upper normal left atrial size. Trivial mitral and tricuspid regurgitation. Unable to assess PASP or CVP.   08/2015 MPI  There was no ST segment deviation noted during stress.  Findings consistent with prior inferior myocardial infarction. There is no current ischemia  This is a low risk study.  The left ventricular ejection fraction is hyperdynamic (>65%).  Duke treadmill score of 5, consistent with low risk for major cardiac events  08/2017 cath  Previously placed Prox LAD to Mid LAD stent (unknown type) is widely  patent.  1st Diag lesion is 70% stenosed.  Mid Cx to Dist Cx lesion is 50% stenosed.  Previously placed Prox RCA to Mid RCA stent (unknown type) is widely patent.  Prox RCA lesion is 40% stenosed.  Acute Mrg lesion is 60% stenosed.  Mid RCA lesion is 60% stenosed.  The left ventricular systolic function is normal.  LV end diastolic pressure is normal.  The left ventricular ejection fraction is 55-65% by visual estimate.  1. Modest 3 vessel CAD - 70% first diagonal - 50% long distal LCx - 60% mid RCA and RV marginal Lorisa Scheid 2. Continued excellent patency of stents in the LAD and RCA 3. Normal LV function  Plan: I do not see any critical disease that would explain his resting chest pain. The first diagonal was severely stenosed post stenting of the LAD and this has improved. I would recommend continued medical therapy    Assessment and Plan  1. CAD -doing well without symptoms, continue current meds - EKG today shows SR, no ischemic changes 2. HTN - manual recheck 128/78, continue meds   3. Hyperlipideima -LDL at goal, continue statin. Did not tolerate higher dosing. Disucssed dietary changes to improve TGs  F/u 6 months      Arnoldo Lenis, M.D.

## 2019-05-14 ENCOUNTER — Other Ambulatory Visit: Payer: Self-pay | Admitting: Cardiology

## 2019-05-16 ENCOUNTER — Ambulatory Visit: Payer: Medicare HMO | Attending: Internal Medicine

## 2019-05-16 DIAGNOSIS — Z23 Encounter for immunization: Secondary | ICD-10-CM

## 2019-05-16 NOTE — Progress Notes (Signed)
   Covid-19 Vaccination Clinic  Name:  KEREL ZEIDERS    MRN: [id number] DOB: 10/30/1950  05/16/2019  Mr. Cuthbert was observed post Covid-19 immunization for 15 minutes without incidence. He was provided with Vaccine Information Sheet and instruction to access the V-Safe system.   Mr. Manso was instructed to call 911 with any severe reactions post vaccine: Marland Kitchen Difficulty breathing  . Swelling of your face and throat  . A fast heartbeat  . A bad rash all over your body  . Dizziness and weakness    Immunizations Administered    Name Date Dose VIS Date Route   Pfizer COVID-19 Vaccine 05/16/2019  3:39 PM 0.3 mL 03/18/2019 Intramuscular   Manufacturer: [place]   Lot: [id number]   [place]: [id number]

## 2019-10-30 ENCOUNTER — Other Ambulatory Visit: Payer: Self-pay | Admitting: Cardiology

## 2019-11-09 ENCOUNTER — Ambulatory Visit: Payer: Medicare HMO | Admitting: Cardiology

## 2020-01-02 NOTE — Progress Notes (Signed)
Cardiology Office Note    Date:  01/03/2020   ID:  [name], DOB 1950-05-11, MRN [id number]  PCP:  Dennis Noble, MD  Cardiologist: Dennis Dolly, MD EPS: None  No chief complaint on file.   History of Present Illness:  Dennis [name] is a 69 y.o. male with history of CAD's status post DES to the LAD, PTCA to the diagonal and DES DES to the RCA 2009 repeat cath in 2019 moderate CAD patent stents medical therapy recommended.  Also has hypertension, hyperlipidemia and history of orthostatic hypotension that resolved off HCTZ.  Patient comes in for f/u. 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 Walks a lot. Denies chest pain, dyspnea, palpitations, dizziness or presyncope.     Past Medical History:  Diagnosis Date  . Coronary artery disease    a. s/p DES to LAD with DES to RCA, and PTCA to D1 in 06/2007 b. 08/2017: cath showing patent stents with 70% D1, 50% LCx, 60% mid-RCA, and 60% Acute Mrg stenosis. Medical therapy recommended.   Marland Kitchen GERD (gastroesophageal reflux disease)   . Gout   . Hypercholesteremia   . Hypertension   . Myocardial infarction ([place]) 1998    Past Surgical History:  Procedure Laterality Date  . bilateral eye surgery    . CARDIAC CATHETERIZATION  2009   PCI with DES to LAD,PTCA to diagonal, and DES to RCA LV gram LVEF 60%   . CARDIAC SURGERY    . COLONOSCOPY    . COLONOSCOPY  05/14/2011   Procedure: COLONOSCOPY;  Surgeon: Dennis Dolin, MD;  [facility];  Service: Endoscopy;  Laterality: N/A;  8:15 AM  . LEFT HEART CATH AND CORONARY ANGIOGRAPHY N/A 08/14/2017   Procedure: LEFT HEART CATH AND CORONARY ANGIOGRAPHY;  Surgeon: [name], Dennis M, MD;  Location: [facility];  Service: Cardiovascular;  Laterality: N/A;    Current Medications: Current Meds  Medication Sig  . acetaminophen (TYLENOL) 500 MG tablet Take 1,000 mg by mouth every 6 (six) hours as needed for mild pain or headache.   Marland Kitchen aspirin (ASPIRIN EC)  81 MG EC tablet Take 81 mg by mouth at bedtime.   Marland Kitchen atorvastatin (LIPITOR) 10 MG tablet Take 10 mg by mouth at bedtime.   . isosorbide mononitrate (IMDUR) 60 MG 24 hr tablet TAKE 1 TABLET BY MOUTH AT BEDTIME  . losartan (COZAAR) 100 MG tablet Take 100 mg by mouth daily.  . pantoprazole (PROTONIX) 40 MG tablet   . potassium bicarbonate (K-LYTE) 25 MEQ disintegrating tablet Take 25 mEq by mouth 2 (two) times daily.  . tamsulosin (FLOMAX) 0.4 MG CAPS capsule Take 0.4 mg by mouth daily.   . verapamil (CALAN-SR) 180 MG CR tablet Take 180 mg by mouth every morning.   . vitamin B-12 (CYANOCOBALAMIN) 500 MCG tablet Take 500 mcg by mouth daily.     Allergies:   Patient has no known allergies.   Social History   Socioeconomic History  . Marital status: Married    Spouse name: Not on file  . Number of children: Not on file  . Years of education: Not on file  . Highest education level: Not on file  Occupational History  . Not on file  Tobacco Use  . Smoking status: Never Smoker  . Smokeless tobacco: Never Used  Vaping Use  . Vaping Use: Never used  Substance and Sexual Activity  . Alcohol use: Yes    Alcohol/week: 5.0 standard drinks  Types: 5 Cans of beer per week    Comment: occ  . Drug use: No  . Sexual activity: Yes  Other Topics Concern  . Not on file  Social History Narrative  . Not on file   Social Determinants of Health   Financial Resource Strain:   . Difficulty of Paying Living Expenses: Not on file  Food Insecurity:   . Worried About Charity fundraiser in the Last Year: Not on file  . Ran Out of Food in the Last Year: Not on file  Transportation Needs:   . Lack of Transportation (Medical): Not on file  . Lack of Transportation (Non-Medical): Not on file  Physical Activity:   . Days of Exercise per Week: Not on file  . Minutes of Exercise per Session: Not on file  Stress:   . Feeling of Stress : Not on file  Social Connections:   . Frequency of Communication  with Friends and Family: Not on file  . Frequency of Social Gatherings with Friends and Family: Not on file  . Attends Religious Services: Not on file  . Active Member of Clubs or Organizations: Not on file  . Attends Archivist Meetings: Not on file  . Marital Status: Not on file     Family History:  The patient's family history includes Heart attack in his father; Stroke in his mother.   ROS:   Please see the history of present illness.    ROS All other systems reviewed and are negative.   PHYSICAL EXAM:   VS:  BP 108/74   Pulse 72   Ht 5\' 6"  (1.676 [name])   Wt 189 lb (85.7 kg)   SpO2 98%   BMI 30.51 kg/[name]   Physical Exam  GEN: Well nourished, well developed, in no acute distress  Neck: no JVD, carotid bruits, or masses Cardiac:RRR; no murmurs, rubs, or gallops  Respiratory:  clear to auscultation bilaterally, normal work of breathing GI: soft, nontender, nondistended, + BS Ext: without cyanosis, clubbing, or edema, Good distal pulses bilaterally Neuro:  Alert and Oriented x 3 Psych: euthymic mood, full affect  Wt Readings from Last 3 Encounters:  01/03/20 189 lb (85.7 kg)  05/10/19 188 lb (85.3 kg)  11/11/18 192 lb (87.1 kg)      Studies/Labs Reviewed:   EKG:  EKG is not ordered today. Recent Labs: No results found for requested labs within last 8760 hours.   Lipid Panel    Component Value Date/Time   CHOL 129 04/27/2014 1053   TRIG 143 04/27/2014 1053   HDL 37 (L) 04/27/2014 1053   CHOLHDL 3.5 04/27/2014 1053   VLDL 29 04/27/2014 1053   LDLCALC 63 04/27/2014 1053    Additional studies/ records that were reviewed today include:  Diagnostic Studies 01/2013 Stress MPI Analysis of the raw perfusion data finds radiotracer uptake in the gut adjacent to the inferior wall.  Tomographic views were obtained using the short axis, vertical long axis, and horizontal long axis planes. There is a small, mild to moderate intensity, inferior perfusion defect  that is fixed. No significant reversible defects to indicate ischemia.  Gated imaging reveals an EDV of 70, ESV of 24, normal TID ratio of 0.89, and LVEF of 65% without wall motion abnormality.  IMPRESSION: Low risk exercise Cardiolite. No chest pain reported, there were no diagnostic ST segment changes at maximum work load of 10.1 METS. Perfusion imaging is most consistent with inferior wall attenuation, no definite scar or  ischemic defects. LV volumes are normal with LVEF 65% and no focal wall motion abnormalities.   01/2013 Echo Study Conclusions  - Left ventricle: The cavity size was normal. There was mild   concentric hypertrophy. Systolic function was normal. The   estimated ejection fraction was in the range of 60% to   65%. Wall motion was normal; there were no regional wall   motion abnormalities. Features are consistent with a   pseudonormal left ventricular filling pattern, with   concomitant abnormal relaxation and increased filling   pressure (grade 2 diastolic dysfunction). - Mitral valve: Trivial regurgitation. - Left atrium: The atrium was at the upper limits of normal   in size. - Tricuspid valve: Physiologic regurgitation. - Pulmonary arteries: Systolic pressure could not be   accurately estimated. - Inferior vena cava: Poorly visualized. Unable to estimate   CVP. - Pericardium, extracardiac: There was no pericardial   effusion. Impressions:  - Comparison to prior study March 2009. Mild LVH with LVEF   19-62%, grade 2 diastolic dysfunction. Upper normal left   atrial size. Trivial mitral and tricuspid regurgitation.   Unable to assess PASP or CVP.      08/2015 MPI  There was no ST segment deviation noted during stress.  Findings consistent with prior inferior myocardial infarction. There is no current ischemia  This is a low risk study.  The left ventricular ejection fraction is hyperdynamic (>65%).  Duke treadmill score of 5, consistent with low  risk for major cardiac events   08/2017 cath  Previously placed Prox LAD to Mid LAD stent (unknown type) is widely patent.  1st Diag lesion is 70% stenosed.  Mid Cx to Dist Cx lesion is 50% stenosed.  Previously placed Prox RCA to Mid RCA stent (unknown type) is widely patent.  Prox RCA lesion is 40% stenosed.  Acute Mrg lesion is 60% stenosed.  Mid RCA lesion is 60% stenosed.  The left ventricular systolic function is normal.  LV end diastolic pressure is normal.  The left ventricular ejection fraction is 55-65% by visual estimate.   1. Modest 3 vessel CAD    - 70% first diagonal    - 50% long distal LCx    - 60% mid RCA and RV marginal branch 2. Continued excellent patency of stents in the LAD and RCA 3. Normal LV function   Plan: I do not see any critical disease that would explain his resting chest pain. The first diagonal was severely stenosed post stenting of the LAD and this has improved. I would recommend continued medical therapy         ASSESSMENT:    1. Coronary artery disease involving native coronary artery of native heart without angina pectoris   2. Orthostatic hypotension   3. Essential hypertension   4. Mixed hyperlipidemia      PLAN:  In order of problems listed above:  CAD with DES to the LAD, PTCA of the diagonal and DES to RCA in 2009 LVEF 60%, no ischemia on stress test 2014 2017, cath 2019 moderate CAD but patent stents medical therapy. Doing well no angina. Stays very active  History of orthostatic hypotension resolved off HCTZ and increased hydration-no recent problems  Essential hypertension BP tends to run low  Hyperlipidemia LDL 64 in April on lipitor   Medication Adjustments/Labs and Tests Ordered: Current medicines are reviewed at length with the patient today.  Concerns regarding medicines are outlined above.  Medication changes, Labs and Tests ordered today are listed  in the Patient Instructions below. Patient Instructions   Medication Instructions:  Your physician recommends that you continue on your current medications as directed. Please refer to the Current Medication list given to you today.  *If you need a refill on your cardiac medications before your next appointment, please call your pharmacy*   Lab Work: None today If you have labs (blood work) drawn today and your tests are completely normal, you will receive your results only by: Marland Kitchen MyChart Message (if you have MyChart) OR . A paper copy in the mail If you have any lab test that is abnormal or we need to change your treatment, we will call you to review the results.   Testing/Procedures: None today    Follow-Up: At [facility], you and your health needs are our priority.  As part of our continuing mission to provide you with exceptional heart care, we have created designated Provider Care Teams.  These Care Teams include your primary Cardiologist (physician) and Advanced Practice Providers (APPs -  Physician Assistants and Nurse Practitioners) who all work together to provide you with the care you need, when you need it.  We recommend signing up for the patient portal called "MyChart".  Sign up information is provided on this After Visit Summary.  MyChart is used to connect with patients for Virtual Visits (Telemedicine).  Patients are able to view lab/test results, encounter notes, upcoming appointments, etc.  Non-urgent messages can be sent to your provider as well.   To learn more about what you can do with MyChart, go to [email or web address].    Your next appointment:   6 month(s)  The format for your next appointment:   In Person  Provider:   Carlyle Dolly, MD   Other Instructions None      Thank you for choosing [facility] !            Signed, Ermalinda Barrios, PA-C  01/03/2020 2:20 PM    [facility] [place], [place], [place]  [address] Phone: [phone]; Fax: [phone]

## 2020-01-03 ENCOUNTER — Encounter: Payer: Self-pay | Admitting: Physician Assistant

## 2020-01-03 ENCOUNTER — Other Ambulatory Visit: Payer: Self-pay

## 2020-01-03 ENCOUNTER — Ambulatory Visit: Payer: Medicare HMO | Admitting: Physician Assistant

## 2020-01-03 VITALS — BP 108/74 | HR 72 | Ht 66.0 in | Wt 189.0 lb

## 2020-01-03 DIAGNOSIS — I2510 Atherosclerotic heart disease of native coronary artery without angina pectoris: Secondary | ICD-10-CM | POA: Diagnosis not present

## 2020-01-03 DIAGNOSIS — I951 Orthostatic hypotension: Secondary | ICD-10-CM

## 2020-01-03 DIAGNOSIS — I10 Essential (primary) hypertension: Secondary | ICD-10-CM

## 2020-01-03 DIAGNOSIS — E782 Mixed hyperlipidemia: Secondary | ICD-10-CM

## 2020-01-03 NOTE — Patient Instructions (Signed)
Medication Instructions:  Your physician recommends that you continue on your current medications as directed. Please refer to the Current Medication list given to you today.  *If you need a refill on your cardiac medications before your next appointment, please call your pharmacy*   Lab Work: None today If you have labs (blood work) drawn today and your tests are completely normal, you will receive your results only by: . MyChart Message (if you have MyChart) OR . A paper copy in the mail If you have any lab test that is abnormal or we need to change your treatment, we will call you to review the results.   Testing/Procedures: None today   Follow-Up: At [facility], you and your health needs are our priority.  As part of our continuing mission to provide you with exceptional heart care, we have created designated Provider Care Teams.  These Care Teams include your primary Cardiologist (physician) and Advanced Practice Providers (APPs -  Physician Assistants and Nurse Practitioners) who all work together to provide you with the care you need, when you need it.  We recommend signing up for the patient portal called "MyChart".  Sign up information is provided on this After Visit Summary.  MyChart is used to connect with patients for Virtual Visits (Telemedicine).  Patients are able to view lab/test results, encounter notes, upcoming appointments, etc.  Non-urgent messages can be sent to your provider as well.   To learn more about what you can do with MyChart, go to [email or web address].    Your next appointment:   6 month(s)  The format for your next appointment:   In Person  Provider:   Jonathan Branch, MD   Other Instructions None       Thank you for choosing [facility] !         

## 2020-01-12 ENCOUNTER — Ambulatory Visit: Payer: Medicare HMO

## 2020-01-12 ENCOUNTER — Ambulatory Visit: Payer: Medicare HMO | Attending: Internal Medicine

## 2020-01-12 DIAGNOSIS — Z23 Encounter for immunization: Secondary | ICD-10-CM

## 2020-01-12 NOTE — Progress Notes (Signed)
   Covid-19 Vaccination Clinic  Name:  ALIJAH AKRAM    MRN: [id number] DOB: [birth year]  01/12/2020  Mr. Behnken was observed post Covid-19 immunization for 15 minutes without incident. He was provided with Vaccine Information Sheet and instruction to access the V-Safe system.   Mr. Kitt was instructed to call 911 with any severe reactions post vaccine: Marland Kitchen Difficulty breathing  . Swelling of face and throat  . A fast heartbeat  . A bad rash all over body  . Dizziness and weakness

## 2020-01-19 ENCOUNTER — Ambulatory Visit: Payer: Medicare HMO

## 2020-04-29 ENCOUNTER — Other Ambulatory Visit: Payer: Self-pay | Admitting: Cardiology

## 2020-05-23 ENCOUNTER — Encounter: Payer: Self-pay | Admitting: *Deleted

## 2020-05-23 ENCOUNTER — Encounter: Payer: Self-pay | Admitting: Cardiology

## 2020-05-23 ENCOUNTER — Ambulatory Visit: Payer: Medicare HMO | Admitting: Cardiology

## 2020-05-23 ENCOUNTER — Telehealth: Payer: Self-pay | Admitting: Cardiology

## 2020-05-23 VITALS — BP 126/78 | HR 84 | Ht 66.0 in | Wt 191.2 lb

## 2020-05-23 DIAGNOSIS — R5383 Other fatigue: Secondary | ICD-10-CM

## 2020-05-23 DIAGNOSIS — I2510 Atherosclerotic heart disease of native coronary artery without angina pectoris: Secondary | ICD-10-CM

## 2020-05-23 NOTE — Progress Notes (Signed)
Clinical Summary Mr. Nhan is a 70 y.o.male seen today for follow up of the following medical problems. This is a focused visit on his history of CAD and recent episode of diaphoresis and fatigue.    1. CAD  - cath 06/2007 in the setting of unstable angina, PCI with DES to LAD,PTCA to diagonal, and DES to RCA LV gram LVEF 60%  - 01/2013 Stress MPI low risk study, no ischemia - 01/2013 echo LVEF 60-65%, grade II diastolic dysfunction - 05/9516 exercise nuclear stress test: no ischemia    08/2017 cath moderate CAD as reported below, patent stents   - episode 1 week ago - was outside moving some items. Episode of feeling very hot, sweaty. No chest pain, no SOB, no palpitations. Some lightheadedness. Went and sat down, rested and went to sleep. BP's 150s/101 -woke 20-55min, still felt hot. Feels weak, fatigued. Nonspecific symptoms in chest. Some SOB since that time.          SH: often travels to [place]. Recent traveled to [place]. Enjoys classic cars. He was good friends with a patient of mine who recently passed away [name]. He still remains in touch with his wife [name] who is also a patient of mine.   Has place [place] often visit   Past Medical History:  Diagnosis Date  . Coronary artery disease    a. s/p DES to LAD with DES to RCA, and PTCA to D1 in 06/2007 b. 08/2017: cath showing patent stents with 70% D1, 50% LCx, 60% mid-RCA, and 60% Acute Mrg stenosis. Medical therapy recommended.   Marland Kitchen GERD (gastroesophageal reflux disease)   . Gout   . Hypercholesteremia   . Hypertension   . Myocardial infarction ([place]) 1998     No Known Allergies   Current Outpatient Medications  Medication Sig Dispense Refill  . acetaminophen (TYLENOL) 500 MG tablet Take 1,000 mg by mouth every 6 (six) hours as needed for mild pain or headache.     Marland Kitchen aspirin (ASPIRIN EC) 81 MG EC tablet Take 81 mg by mouth at bedtime.     Marland Kitchen  atorvastatin (LIPITOR) 10 MG tablet Take 10 mg by mouth at bedtime.   1  . isosorbide mononitrate (IMDUR) 60 MG 24 hr tablet TAKE 1 TABLET BY MOUTH AT BEDTIME 90 tablet 3  . losartan (COZAAR) 100 MG tablet Take 100 mg by mouth daily.  1  . pantoprazole (PROTONIX) 40 MG tablet     . potassium bicarbonate (K-LYTE) 25 MEQ disintegrating tablet Take 25 mEq by mouth 2 (two) times daily.    . tamsulosin (FLOMAX) 0.4 MG CAPS capsule Take 0.4 mg by mouth daily.     . verapamil (CALAN-SR) 180 MG CR tablet Take 180 mg by mouth every morning.     . vitamin B-12 (CYANOCOBALAMIN) 500 MCG tablet Take 500 mcg by mouth daily.     No current facility-administered medications for this visit.     Past Surgical History:  Procedure Laterality Date  . bilateral eye surgery    . CARDIAC CATHETERIZATION  2009   PCI with DES to LAD,PTCA to diagonal, and DES to RCA LV gram LVEF 60%   . CARDIAC SURGERY    . COLONOSCOPY    . COLONOSCOPY  05/14/2011   Procedure: COLONOSCOPY;  Surgeon: Daneil Dolin, MD;  [facility];  Service: Endoscopy;  Laterality: N/A;  8:15 AM  . LEFT HEART CATH AND CORONARY ANGIOGRAPHY N/A  08/14/2017   Procedure: LEFT HEART CATH AND CORONARY ANGIOGRAPHY;  Surgeon: Martinique, Peter M, MD;  Location: [facility];  Service: Cardiovascular;  Laterality: N/A;     No Known Allergies    Family History  Problem Relation Age of Onset  . Stroke Mother   . Heart attack Father   . Colon cancer Neg Hx      Social History Mr. Klugh reports that he has never smoked. He has never used smokeless tobacco. Mr. Tuohy reports current alcohol use of about 5.0 standard drinks of alcohol per week.   Review of Systems CONSTITUTIONAL: No weight loss, fever, chills, weakness or fatigue.  HEENT: Eyes: No visual loss, blurred vision, double vision or yellow sclerae.No hearing loss, sneezing, congestion, runny nose or sore throat.  SKIN: No rash or itching.  CARDIOVASCULAR: per  hpi RESPIRATORY: No shortness of breath, cough or sputum.  GASTROINTESTINAL: No anorexia, nausea, vomiting or diarrhea. No abdominal pain or blood.  GENITOURINARY: No burning on urination, no polyuria NEUROLOGICAL: No headache, dizziness, syncope, paralysis, ataxia, numbness or tingling in the extremities. No change in bowel or bladder control.  MUSCULOSKELETAL: No muscle, back pain, joint pain or stiffness.  LYMPHATICS: No enlarged nodes. No history of splenectomy.  PSYCHIATRIC: No history of depression or anxiety.  ENDOCRINOLOGIC: No reports of sweating, cold or heat intolerance. No polyuria or polydipsia.  Marland Kitchen   Physical Examination Today's Vitals   05/23/20 1326  BP: 126/78  Pulse: 84  SpO2: 98%  Weight: 191 lb 3.2 oz (86.7 kg)  Height: 5\' 6"  (1.676 m)   Body mass index is 30.86 kg/m.  Gen: resting comfortably, no acute distress HEENT: no scleral icterus, pupils equal round and reactive, no palptable cervical adenopathy,  CV: RRR, no m/rg, no jvd Resp: Clear to auscultation bilaterally GI: abdomen is soft, non-tender, non-distended, normal bowel sounds, no hepatosplenomegaly MSK: extremities are warm, no edema.  Skin: warm, no rash Neuro:  no focal deficits Psych: appropriate affect   Diagnostic Studies  01/2013 Stress MPI Analysis of the raw perfusion data finds radiotracer uptake in the gut adjacent to the inferior wall.  Tomographic views were obtained using the short axis, vertical long axis, and horizontal long axis planes. There is a small, mild to moderate intensity, inferior perfusion defect that is fixed. No significant reversible defects to indicate ischemia.  Gated imaging reveals an EDV of 70, ESV of 24, normal TID ratio of 0.89, and LVEF of 65% without wall motion abnormality.  IMPRESSION: Low risk exercise Cardiolite. No chest pain reported, there were no diagnostic ST segment changes at maximum work load of 10.1 METS. Perfusion imaging is most  consistent with inferior wall attenuation, no definite scar or ischemic defects. LV volumes are normal with LVEF 65% and no focal wall motion abnormalities.  01/2013 Echo Study Conclusions  - Left ventricle: The cavity size was normal. There was mild concentric hypertrophy. Systolic function was normal. The estimated ejection fraction was in the range of 60% to 65%. Wall motion was normal; there were no regional wall motion abnormalities. Features are consistent with a pseudonormal left ventricular filling pattern, with concomitant abnormal relaxation and increased filling pressure (grade 2 diastolic dysfunction). - Mitral valve: Trivial regurgitation. - Left atrium: The atrium was at the upper limits of normal in size. - Tricuspid valve: Physiologic regurgitation. - Pulmonary arteries: Systolic pressure could not be accurately estimated. - Inferior vena cava: Poorly visualized. Unable to estimate CVP. - Pericardium, extracardiac: There was no pericardial  effusion. Impressions:  - Comparison to prior study March 2009. Mild LVH with LVEF 58-30%, grade 2 diastolic dysfunction. Upper normal left atrial size. Trivial mitral and tricuspid regurgitation. Unable to assess PASP or CVP.   08/2015 MPI  There was no ST segment deviation noted during stress.  Findings consistent with prior inferior myocardial infarction. There is no current ischemia  This is a low risk study.  The left ventricular ejection fraction is hyperdynamic (>65%).  Duke treadmill score of 5, consistent with low risk for major cardiac events  08/2017 cath  Previously placed Prox LAD to Mid LAD stent (unknown type) is widely patent.  1st Diag lesion is 70% stenosed.  Mid Cx to Dist Cx lesion is 50% stenosed.  Previously placed Prox RCA to Mid RCA stent (unknown type) is widely patent.  Prox RCA lesion is 40% stenosed.  Acute Mrg lesion is 60% stenosed.  Mid RCA  lesion is 60% stenosed.  The left ventricular systolic function is normal.  LV end diastolic pressure is normal.  The left ventricular ejection fraction is 55-65% by visual estimate.  1. Modest 3 vessel CAD - 70% first diagonal - 50% long distal LCx - 60% mid RCA and RV marginal [place] 2. Continued excellent patency of stents in the LAD and RCA 3. Normal LV function  Plan: I do not see any critical disease that would explain his resting chest pain. The first diagonal was severely stenosed post stenting of the LAD and this has improved. I would recommend continued medical therapy     Assessment and Plan  1. CAD -unclear etiology of recent exertioanl fatigue and diaphoresis - has been a few years since he had an ischemia evalution will plan for a lexiscan to evaluate any underlying ischemia as the etiology of his symptoms.       Arnoldo Lenis, M.D.

## 2020-05-23 NOTE — Telephone Encounter (Signed)
Pre-cert Verification for the following procedure    LEXISCAN MYOVIEW    DATE:  06/06/2020  LOCATION: [facility]

## 2020-05-23 NOTE — Patient Instructions (Signed)
Your physician recommends that you schedule a follow-up appointment in: [place]   Your physician recommends that you continue on your current medications as directed. Please refer to the Current Medication list given to you today.  Your physician has requested that you have a lexiscan myoview. For further information please visit [email or web address]. Please follow instruction sheet, as given.  Thank you for choosing [facility]!!

## 2020-06-06 ENCOUNTER — Encounter (HOSPITAL_COMMUNITY): Payer: Self-pay

## 2020-06-06 ENCOUNTER — Other Ambulatory Visit: Payer: Self-pay

## 2020-06-06 ENCOUNTER — Encounter (HOSPITAL_BASED_OUTPATIENT_CLINIC_OR_DEPARTMENT_OTHER)
Admission: RE | Admit: 2020-06-06 | Discharge: 2020-06-06 | Disposition: A | Discharge status: Home / Self Care | Payer: Medicare HMO | Source: Ambulatory Visit | Attending: Cardiology | Admitting: Cardiology

## 2020-06-06 ENCOUNTER — Encounter (HOSPITAL_COMMUNITY)
Admission: RE | Admit: 2020-06-06 | Discharge: 2020-06-06 | Disposition: A | Discharge status: Home / Self Care | Payer: Medicare HMO | Source: Ambulatory Visit | Attending: Cardiology | Admitting: Cardiology

## 2020-06-06 DIAGNOSIS — I2510 Atherosclerotic heart disease of native coronary artery without angina pectoris: Secondary | ICD-10-CM | POA: Insufficient documentation

## 2020-06-06 LAB — NM MYOCAR MULTI W/SPECT W/WALL MOTION / EF
LV dias vol: 85 mL (ref 62–150)
LV sys vol: 28 mL
Peak HR: 96 {beats}/min
RATE: 0.36
Rest HR: 63 {beats}/min
SDS: 0
SRS: 4
SSS: 4
TID: 1.01

## 2020-06-06 MED ORDER — TECHNETIUM TC 99M TETROFOSMIN IV KIT
10.0000 | PACK | Freq: Once | INTRAVENOUS | Status: AC | PRN
  Administered 2020-06-06: 10.8 via INTRAVENOUS

## 2020-06-06 MED ORDER — TECHNETIUM TC 99M TETROFOSMIN IV KIT
30.0000 | PACK | Freq: Once | INTRAVENOUS | Status: AC | PRN
  Administered 2020-06-06: 32 via INTRAVENOUS

## 2020-06-06 MED ORDER — REGADENOSON 0.4 MG/5ML IV SOLN
INTRAVENOUS | Status: AC
  Administered 2020-06-06: 0.4 mg via INTRAVENOUS
  Filled 2020-06-06: qty 5

## 2020-06-06 MED ORDER — SODIUM CHLORIDE FLUSH 0.9 % IV SOLN
INTRAVENOUS | Status: AC
  Administered 2020-06-06: 10 mL via INTRAVENOUS
  Filled 2020-06-06: qty 10

## 2020-06-13 ENCOUNTER — Telehealth: Payer: Self-pay | Admitting: *Deleted

## 2020-06-13 NOTE — Telephone Encounter (Signed)
Pt calling for stress test results.   Normal stress test, no signs of any blockages   Zandra Abts MD  Results given and pt voiced understanding

## 2020-06-15 ENCOUNTER — Telehealth: Payer: Self-pay | Admitting: *Deleted

## 2020-06-15 NOTE — Telephone Encounter (Signed)
Pt voiced understanding

## 2020-06-15 NOTE — Telephone Encounter (Signed)
-----   Message from Arnoldo Lenis, MD sent at 06/11/2020  6:38 PM EST ----- Normal stress test, no signs of any blockages   Zandra Abts MD

## 2020-07-11 ENCOUNTER — Ambulatory Visit: Payer: Medicare HMO | Admitting: Cardiology

## 2020-07-16 ENCOUNTER — Other Ambulatory Visit (HOSPITAL_COMMUNITY): Payer: Self-pay | Admitting: Internal Medicine

## 2020-07-16 ENCOUNTER — Other Ambulatory Visit: Payer: Self-pay | Admitting: Internal Medicine

## 2020-07-16 DIAGNOSIS — N3943 Post-void dribbling: Secondary | ICD-10-CM

## 2020-07-23 ENCOUNTER — Ambulatory Visit (HOSPITAL_COMMUNITY)
Admission: RE | Admit: 2020-07-23 | Discharge: 2020-07-23 | Disposition: A | Discharge status: Home / Self Care | Payer: Medicare HMO | Source: Ambulatory Visit | Attending: Internal Medicine | Admitting: Internal Medicine

## 2020-07-23 ENCOUNTER — Other Ambulatory Visit: Payer: Self-pay

## 2020-07-23 DIAGNOSIS — N3943 Post-void dribbling: Secondary | ICD-10-CM | POA: Diagnosis not present

## 2020-07-23 DIAGNOSIS — N281 Cyst of kidney, acquired: Secondary | ICD-10-CM | POA: Insufficient documentation

## 2020-07-30 ENCOUNTER — Other Ambulatory Visit (HOSPITAL_COMMUNITY): Payer: Self-pay | Admitting: Internal Medicine

## 2020-07-30 ENCOUNTER — Other Ambulatory Visit: Payer: Self-pay | Admitting: Internal Medicine

## 2020-07-30 DIAGNOSIS — N2889 Other specified disorders of kidney and ureter: Secondary | ICD-10-CM

## 2020-08-03 ENCOUNTER — Ambulatory Visit (HOSPITAL_COMMUNITY)
Admission: RE | Admit: 2020-08-03 | Discharge: 2020-08-03 | Disposition: A | Discharge status: Home / Self Care | Payer: Medicare HMO | Source: Ambulatory Visit | Attending: Internal Medicine | Admitting: Internal Medicine

## 2020-08-03 DIAGNOSIS — N2889 Other specified disorders of kidney and ureter: Secondary | ICD-10-CM | POA: Diagnosis present

## 2020-08-03 MED ORDER — GADOBUTROL 1 MMOL/ML IV SOLN
10.0000 mL | Freq: Once | INTRAVENOUS | Status: AC | PRN
  Administered 2020-08-03: 10 mL via INTRAVENOUS

## 2020-08-07 ENCOUNTER — Ambulatory Visit: Payer: Medicare HMO | Admitting: Cardiology

## 2020-08-07 ENCOUNTER — Other Ambulatory Visit: Payer: Self-pay

## 2020-08-07 ENCOUNTER — Encounter: Payer: Self-pay | Admitting: Cardiology

## 2020-08-07 VITALS — BP 130/68 | HR 66 | Ht 66.0 in | Wt 193.0 lb

## 2020-08-07 DIAGNOSIS — I10 Essential (primary) hypertension: Secondary | ICD-10-CM | POA: Diagnosis not present

## 2020-08-07 DIAGNOSIS — I2510 Atherosclerotic heart disease of native coronary artery without angina pectoris: Secondary | ICD-10-CM | POA: Diagnosis not present

## 2020-08-07 DIAGNOSIS — E782 Mixed hyperlipidemia: Secondary | ICD-10-CM

## 2020-08-07 NOTE — Progress Notes (Signed)
Clinical Summary Mr. Desroches is a 70 y.o.male  1. CAD  - cath 06/2007 in the setting of unstable angina, PCI with DES to LAD,PTCA to diagonal, and DES to RCA LV gram LVEF 60%  - 01/2013 Stress MPI low risk study, no ischemia - 01/2013 echo LVEF 60-65%, grade II diastolic dysfunction - 12/6293 exercise nuclear stress test: no ischemia    08/2017 cath moderate CAD as reported below, patent stents   06/2020 nuclear stress: no ischemia - stress test was ordered after an episode of fatigue and diaphoresis, did not have chest pain.  -no chest pains, no SOB/DOE since stress test - compliant with meds   2. Dizziness - orthostaticat prior clinic visit. Symptoms have improved off HCTZ and with increaesd hydration. -   3. HTN - remains compliant with meds  4. Hyperlipidemia - prior mylagias on lipitor 20 mg daily,he had done well onlipitor 10mg  daily.   06/2018 TC 133 TG 207 HDL 36 LDL 56 - 07/2020 TC 121 TG 144 HDL 25 LDL 70         SH: often travels to [place]. Recent traveled to [place]. Enjoys classic cars. He was good friends with a patient of mine who recently passed away [name]. He still remains in touch with his wife [name] who is also a patient of mine.  Has place [place] often visit  Has a 9 yo grandaughter that is at [employer] Past Medical History:  Diagnosis Date  . Coronary artery disease    a. s/p DES to LAD with DES to RCA, and PTCA to D1 in 06/2007 b. 08/2017: cath showing patent stents with 70% D1, 50% LCx, 60% mid-RCA, and 60% Acute Mrg stenosis. Medical therapy recommended.   Marland Kitchen GERD (gastroesophageal reflux disease)   . Gout   . Hypercholesteremia   . Hypertension   . Myocardial infarction ([place]) 1998     No Known Allergies   Current Outpatient Medications  Medication Sig Dispense Refill  . acetaminophen (TYLENOL) 500 MG tablet Take 1,000 mg by mouth every 6 (six) hours as  needed for mild pain or headache.     Marland Kitchen aspirin 81 MG EC tablet Take 81 mg by mouth at bedtime.    Marland Kitchen atorvastatin (LIPITOR) 10 MG tablet Take 10 mg by mouth at bedtime.   1  . isosorbide mononitrate (IMDUR) 60 MG 24 hr tablet TAKE 1 TABLET BY MOUTH AT BEDTIME 90 tablet 3  . losartan (COZAAR) 100 MG tablet Take 100 mg by mouth daily.  1  . nitroGLYCERIN (NITROSTAT) 0.4 MG SL tablet Place 1 tablet under the tongue every 5 (five) minutes x 3 doses as needed for chest pain. If no relief after 2nd dose, proceed to the ED for an evaluation or call 911    . pantoprazole (PROTONIX) 40 MG tablet     . tamsulosin (FLOMAX) 0.4 MG CAPS capsule Take 0.4 mg by mouth daily.     . valsartan (DIOVAN) 160 MG tablet Take 300 mg by mouth daily.    . valsartan (DIOVAN) 320 MG tablet Take 320 mg by mouth daily. (Patient not taking: Reported on 05/23/2020)    . verapamil (CALAN-SR) 180 MG CR tablet Take 180 mg by mouth every morning.    . vitamin B-12 (CYANOCOBALAMIN) 500 MCG tablet Take 500 mcg by mouth daily.     No current facility-administered medications for this visit.     Past Surgical History:  Procedure Laterality  Date  . bilateral eye surgery    . CARDIAC CATHETERIZATION  2009   PCI with DES to LAD,PTCA to diagonal, and DES to RCA LV gram LVEF 60%   . CARDIAC SURGERY    . COLONOSCOPY    . COLONOSCOPY  05/14/2011   Procedure: COLONOSCOPY;  Surgeon: Daneil Dolin, MD;  [facility];  Service: Endoscopy;  Laterality: N/A;  8:15 AM  . LEFT HEART CATH AND CORONARY ANGIOGRAPHY N/A 08/14/2017   Procedure: LEFT HEART CATH AND CORONARY ANGIOGRAPHY;  Surgeon: Martinique, Peter M, MD;  Location: [facility];  Service: Cardiovascular;  Laterality: N/A;     No Known Allergies    Family History  Problem Relation Age of Onset  . Stroke Mother   . Heart attack Father   . Colon cancer Neg Hx      Social History Mr. Dhondt reports that he has never smoked. He has never used smokeless  tobacco. Mr. Omlor reports current alcohol use of about 5.0 standard drinks of alcohol per week.   Review of Systems CONSTITUTIONAL: No weight loss, fever, chills, weakness or fatigue.  HEENT: Eyes: No visual loss, blurred vision, double vision or yellow sclerae.No hearing loss, sneezing, congestion, runny nose or sore throat.  SKIN: No rash or itching.  CARDIOVASCULAR: per hpi RESPIRATORY: No shortness of breath, cough or sputum.  GASTROINTESTINAL: No anorexia, nausea, vomiting or diarrhea. No abdominal pain or blood.  GENITOURINARY: No burning on urination, no polyuria NEUROLOGICAL: No headache, dizziness, syncope, paralysis, ataxia, numbness or tingling in the extremities. No change in bowel or bladder control.  MUSCULOSKELETAL: No muscle, back pain, joint pain or stiffness.  LYMPHATICS: No enlarged nodes. No history of splenectomy.  PSYCHIATRIC: No history of depression or anxiety.  ENDOCRINOLOGIC: No reports of sweating, cold or heat intolerance. No polyuria or polydipsia.  Marland Kitchen   Physical Examination Today's Vitals   08/07/20 0806  BP: 130/68  Pulse: 66  SpO2: 98%  Weight: 193 lb (87.5 kg)  Height: 5\' 6"  (1.676 m)   Body mass index is 31.15 kg/m.  Gen: resting comfortably, no acute distress HEENT: no scleral icterus, pupils equal round and reactive, no palptable cervical adenopathy,  CV: RRR, no m/r/g, no jvd Resp: Clear to auscultation bilaterally GI: abdomen is soft, non-tender, non-distended, normal bowel sounds, no hepatosplenomegaly MSK: extremities are warm, no edema.  Skin: warm, no rash Neuro:  no focal deficits Psych: appropriate affect   Diagnostic Studies 01/2013 Stress MPI Analysis of the raw perfusion data finds radiotracer uptake in the gut adjacent to the inferior wall.  Tomographic views were obtained using the short axis, vertical long axis, and horizontal long axis planes. There is a small, mild to moderate intensity, inferior perfusion defect  that is fixed. No significant reversible defects to indicate ischemia.  Gated imaging reveals an EDV of 70, ESV of 24, normal TID ratio of 0.89, and LVEF of 65% without wall motion abnormality.  IMPRESSION: Low risk exercise Cardiolite. No chest pain reported, there were no diagnostic ST segment changes at maximum work load of 10.1 METS. Perfusion imaging is most consistent with inferior wall attenuation, no definite scar or ischemic defects. LV volumes are normal with LVEF 65% and no focal wall motion abnormalities.  01/2013 Echo Study Conclusions  - Left ventricle: The cavity size was normal. There was mild concentric hypertrophy. Systolic function was normal. The estimated ejection fraction was in the range of 60% to 65%. Wall motion was normal; there were  no regional wall motion abnormalities. Features are consistent with a pseudonormal left ventricular filling pattern, with concomitant abnormal relaxation and increased filling pressure (grade 2 diastolic dysfunction). - Mitral valve: Trivial regurgitation. - Left atrium: The atrium was at the upper limits of normal in size. - Tricuspid valve: Physiologic regurgitation. - Pulmonary arteries: Systolic pressure could not be accurately estimated. - Inferior vena cava: Poorly visualized. Unable to estimate CVP. - Pericardium, extracardiac: There was no pericardial effusion. Impressions:  - Comparison to prior study March 2009. Mild LVH with LVEF 46-27%, grade 2 diastolic dysfunction. Upper normal left atrial size. Trivial mitral and tricuspid regurgitation. Unable to assess PASP or CVP.   08/2015 MPI  There was no ST segment deviation noted during stress.  Findings consistent with prior inferior myocardial infarction. There is no current ischemia  This is a low risk study.  The left ventricular ejection fraction is hyperdynamic (>65%).  Duke treadmill score of 5, consistent with low  risk for major cardiac events  08/2017 cath  Previously placed Prox LAD to Mid LAD stent (unknown type) is widely patent.  1st Diag lesion is 70% stenosed.  Mid Cx to Dist Cx lesion is 50% stenosed.  Previously placed Prox RCA to Mid RCA stent (unknown type) is widely patent.  Prox RCA lesion is 40% stenosed.  Acute Mrg lesion is 60% stenosed.  Mid RCA lesion is 60% stenosed.  The left ventricular systolic function is normal.  LV end diastolic pressure is normal.  The left ventricular ejection fraction is 55-65% by visual estimate.  1. Modest 3 vessel CAD - 70% first diagonal - 50% long distal LCx - 60% mid RCA and RV marginal Devron Cohick 2. Continued excellent patency of stents in the LAD and RCA 3. Normal LV function  Plan: I do not see any critical disease that would explain his resting chest pain. The first diagonal was severely stenosed post stenting of the LAD and this has improved. I would recommend continued medical therapy  06/2020 nuclear stress  There was no ST segment deviation noted during stress.  No T wave inversion was noted during stress.  Findings consistent with prior inferior myocardial infarction. There is no current ischemia  This is a low risk study.  The left ventricular ejection fraction is hyperdynamic (>65%).     Assessment and Plan  1. CAD -stress test was benign - no significant chest pains or SOB/DOE since stress  continue curren tmeds  2. HTN - he is at goal, continue current meds   3. Hyperlipideima -. Did not tolerate higher dosing, he is on just atorva 10mg  daily - LDL at goal, continue statin  F/u 6 months    Arnoldo Lenis, M.D.

## 2020-08-07 NOTE — Patient Instructions (Signed)

## 2021-04-28 ENCOUNTER — Other Ambulatory Visit: Payer: Self-pay | Admitting: Cardiology

## 2021-05-03 ENCOUNTER — Encounter: Payer: Self-pay | Admitting: Cardiology

## 2021-05-03 ENCOUNTER — Ambulatory Visit: Payer: Medicare HMO | Admitting: Cardiology

## 2021-05-03 VITALS — BP 108/72 | HR 66 | Ht 66.0 in | Wt 193.0 lb

## 2021-05-03 DIAGNOSIS — I2510 Atherosclerotic heart disease of native coronary artery without angina pectoris: Secondary | ICD-10-CM | POA: Diagnosis not present

## 2021-05-03 DIAGNOSIS — I10 Essential (primary) hypertension: Secondary | ICD-10-CM | POA: Diagnosis not present

## 2021-05-03 DIAGNOSIS — E782 Mixed hyperlipidemia: Secondary | ICD-10-CM

## 2021-05-03 NOTE — Progress Notes (Signed)
Clinical Summary Dennis [name] is a 71 y.o.male seen today for follow up of the following medical problems.   1. CAD   - cath 06/2007 in the setting of unstable angina, PCI with DES to LAD,PTCA to diagonal, and DES to RCA LV gram LVEF 60%   - 01/2013 Stress MPI low risk study, no ischemia - 01/2013 echo LVEF 60-65%, grade II diastolic dysfunction - 04/5828 exercise nuclear stress test: no ischemia       08/2017 cath moderate CAD as reported below, patent stents      06/2020 nuclear stress: no ischemia   - no chest pains. No SOB/DOE.  - compliant with meds - compliant with meds      2. Dizziness  - orthostatic at prior clinic visit. Symptoms have improved off HCTZ and with increaesd hydration.  - no recurrent issues.      3. HTN - he is compliant with meds   4. Hyperlipidemia -  prior mylagias on lipitor 20 mg daily, he had done well on  lipitor 10mg  daily.       06/2018 TC 133 TG 207 HDL 36 LDL 56 - 07/2020 TC 121 TG 144 HDL 25 LDL 70 - upcoming labs with pcp                  SH: often travels to [place]. Recent traveled to [place]. Enjoys classic cars. He was good friends with a patient of mine who recently passed away [name]. He still remains in touch with his wife Dennis [name] who is also a patient of mine.    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 s/p DES to LAD with DES to RCA, and PTCA to D1 in 06/2007 b. 08/2017: cath showing patent stents with 70% D1, 50% LCx, 60% mid-RCA, and 60% Acute Mrg stenosis. Medical therapy recommended.    GERD (gastroesophageal reflux disease)    Gout    Hypercholesteremia    Hypertension    Myocardial infarction ([place]) 1998     No Known Allergies   Current Outpatient Medications  Medication Sig Dispense Refill   acetaminophen (TYLENOL) 500 MG tablet Take 1,000 mg by mouth  every 6 (six) hours as needed for mild pain or headache.      aspirin 81 MG EC tablet Take 81 mg by mouth at bedtime.     atorvastatin (LIPITOR) 10 MG tablet Take 10 mg by mouth at bedtime.   1   isosorbide mononitrate (IMDUR) 60 MG 24 hr tablet TAKE 1 TABLET BY MOUTH AT BEDTIME 90 tablet 1   nitroGLYCERIN (NITROSTAT) 0.4 MG SL tablet Place 1 tablet under the tongue every 5 (five) minutes x 3 doses as needed for chest pain. If no relief after 2nd dose, proceed to the ED for an evaluation or call 911     pantoprazole (PROTONIX) 40 MG tablet      tamsulosin (FLOMAX) 0.4 MG CAPS capsule Take 0.4 mg by mouth daily.      valsartan (DIOVAN) 320 MG tablet Take 320 mg by mouth daily.     verapamil (CALAN-SR) 180 MG CR tablet Take 180 mg by mouth every morning.     vitamin B-12 (CYANOCOBALAMIN) 500 MCG tablet Take 500 mcg by mouth daily.     No current facility-administered medications for  this visit.     Past Surgical History:  Procedure Laterality Date   bilateral eye surgery     CARDIAC CATHETERIZATION  2009   PCI with DES to LAD,PTCA to diagonal, and DES to RCA LV gram LVEF 60%    CARDIAC SURGERY     COLONOSCOPY     COLONOSCOPY  05/14/2011   Procedure: COLONOSCOPY;  Surgeon: Daneil Dolin, MD;  [facility];  Service: Endoscopy;  Laterality: N/A;  8:15 AM   LEFT HEART CATH AND CORONARY ANGIOGRAPHY N/A 08/14/2017   Procedure: LEFT HEART CATH AND CORONARY ANGIOGRAPHY;  Surgeon: Martinique, Peter M, MD;  Location: [facility];  Service: Cardiovascular;  Laterality: N/A;     No Known Allergies    Family History  Problem Relation Age of Onset   Stroke Mother    Heart attack Father    Colon cancer Neg Hx      Social History Mr. Shonka reports that he has never smoked. He has never used smokeless tobacco. Mr. Melucci reports current alcohol use of about 5.0 standard drinks per week.   Review of Systems CONSTITUTIONAL: No weight loss, fever, chills, weakness or  fatigue.  HEENT: Eyes: No visual loss, blurred vision, double vision or yellow sclerae.No hearing loss, sneezing, congestion, runny nose or sore throat.  SKIN: No rash or itching.  CARDIOVASCULAR: per hpi RESPIRATORY: No shortness of breath, cough or sputum.  GASTROINTESTINAL: No anorexia, nausea, vomiting or diarrhea. No abdominal pain or blood.  GENITOURINARY: No burning on urination, no polyuria NEUROLOGICAL: No headache, dizziness, syncope, paralysis, ataxia, numbness or tingling in the extremities. No change in bowel or bladder control.  MUSCULOSKELETAL: No muscle, back pain, joint pain or stiffness.  LYMPHATICS: No enlarged nodes. No history of splenectomy.  PSYCHIATRIC: No history of depression or anxiety.  ENDOCRINOLOGIC: No reports of sweating, cold or heat intolerance. No polyuria or polydipsia.  Marland Kitchen   Physical Examination Today's Vitals   05/03/21 0903  BP: 108/72  Pulse: 66  SpO2: 97%  Weight: 193 lb (87.5 kg)  Height: 5\' 6"  (1.676 m)   Body mass index is 31.15 kg/m.  Gen: resting comfortably, no acute distress HEENT: no scleral icterus, pupils equal round and reactive, no palptable cervical adenopathy,  CV: RRR, no mrg, no jvd Resp: Clear to auscultation bilaterally GI: abdomen is soft, non-tender, non-distended, normal bowel sounds, no hepatosplenomegaly MSK: extremities are warm, no edema.  Skin: warm, no rash Neuro:  no focal deficits Psych: appropriate affect   Diagnostic Studies  01/2013 Stress MPI Analysis of the raw perfusion data finds radiotracer uptake in the gut adjacent to the inferior wall.  Tomographic views were obtained using the short axis, vertical long axis, and horizontal long axis planes. There is a small, mild to moderate intensity, inferior perfusion defect that is fixed. No significant reversible defects to indicate ischemia.  Gated imaging reveals an EDV of 70, ESV of 24, normal TID ratio of 0.89, and LVEF of 65% without wall  motion abnormality.  IMPRESSION: Low risk exercise Cardiolite. No chest pain reported, there were no diagnostic ST segment changes at maximum work load of 10.1 METS. Perfusion imaging is most consistent with inferior wall attenuation, no definite scar or ischemic defects. LV volumes are normal with LVEF 65% and no focal wall motion abnormalities.   01/2013 Echo Study Conclusions  - Left ventricle: The cavity size was normal. There was mild   concentric hypertrophy. Systolic function was normal. The   estimated  ejection fraction was in the range of 60% to   65%. Wall motion was normal; there were no regional wall   motion abnormalities. Features are consistent with a   pseudonormal left ventricular filling pattern, with   concomitant abnormal relaxation and increased filling   pressure (grade 2 diastolic dysfunction). - Mitral valve: Trivial regurgitation. - Left atrium: The atrium was at the upper limits of normal   in size. - Tricuspid valve: Physiologic regurgitation. - Pulmonary arteries: Systolic pressure could not be   accurately estimated. - Inferior vena cava: Poorly visualized. Unable to estimate   CVP. - Pericardium, extracardiac: There was no pericardial   effusion. Impressions:  - Comparison to prior study March 2009. Mild LVH with LVEF   38-10%, grade 2 diastolic dysfunction. Upper normal left   atrial size. Trivial mitral and tricuspid regurgitation.   Unable to assess PASP or CVP.      08/2015 MPI There was no ST segment deviation noted during stress. Findings consistent with prior inferior myocardial infarction. There is no current ischemia This is a low risk study. The left ventricular ejection fraction is hyperdynamic (>65%). Duke treadmill score of 5, consistent with low risk for major cardiac events   08/2017 cath Previously placed Prox LAD to Mid LAD stent (unknown type) is widely patent. 1st Diag lesion is 70% stenosed. Mid Cx to Dist Cx lesion is  50% stenosed. Previously placed Prox RCA to Mid RCA stent (unknown type) is widely patent. Prox RCA lesion is 40% stenosed. Acute Mrg lesion is 60% stenosed. Mid RCA lesion is 60% stenosed. The left ventricular systolic function is normal. LV end diastolic pressure is normal. The left ventricular ejection fraction is 55-65% by visual estimate.   1. Modest 3 vessel CAD    - 70% first diagonal    - 50% long distal LCx    - 60% mid RCA and RV marginal Hassan Blackshire 2. Continued excellent patency of stents in the LAD and RCA 3. Normal LV function   Plan: I do not see any critical disease that would explain his resting chest pain. The first diagonal was severely stenosed post stenting of the LAD and this has improved. I would recommend continued medical therapy   06/2020 nuclear stress There was no ST segment deviation noted during stress. No T wave inversion was noted during stress. Findings consistent with prior inferior myocardial infarction. There is no current ischemia This is a low risk study. The left ventricular ejection fraction is hyperdynamic (>65%).     Assessment and Plan  1. CAD -no recent symptoms, stress test last year was benign - continue current meds   2. HTN - bp is at goal, continue current meds     3. Hyperlipideima -. Did not tolerate higher dosing, he is on just atorva 10mg  daily - LDL has been at goal, f/u upcoming labs with pcp  F/u 6 months      Arnoldo Lenis, M.D.

## 2021-05-03 NOTE — Patient Instructions (Signed)
Follow-Up: °Follow up with Dr. Branch in 6 months. ° °If you need a refill on your cardiac medications before your next appointment, please call your pharmacy. ° °

## 2021-07-18 ENCOUNTER — Encounter: Payer: Self-pay | Admitting: Cardiology

## 2021-07-25 ENCOUNTER — Encounter: Payer: Self-pay | Admitting: Cardiology

## 2021-08-04 ENCOUNTER — Other Ambulatory Visit: Payer: Self-pay | Admitting: Cardiology

## 2021-09-04 ENCOUNTER — Encounter: Payer: Self-pay | Admitting: *Deleted

## 2021-09-18 ENCOUNTER — Encounter (HOSPITAL_COMMUNITY): Payer: Self-pay | Admitting: *Deleted

## 2021-09-18 ENCOUNTER — Emergency Department (HOSPITAL_COMMUNITY): Payer: Medicare HMO

## 2021-09-18 ENCOUNTER — Other Ambulatory Visit: Payer: Self-pay

## 2021-09-18 ENCOUNTER — Emergency Department (HOSPITAL_COMMUNITY)
Admission: EM | Admit: 2021-09-18 | Discharge: 2021-09-19 | Disposition: A | Discharge status: Home / Self Care | Payer: Medicare HMO | Attending: Emergency Medicine | Admitting: Emergency Medicine

## 2021-09-18 DIAGNOSIS — I10 Essential (primary) hypertension: Secondary | ICD-10-CM | POA: Diagnosis not present

## 2021-09-18 DIAGNOSIS — Z7982 Long term (current) use of aspirin: Secondary | ICD-10-CM | POA: Diagnosis not present

## 2021-09-18 DIAGNOSIS — R200 Anesthesia of skin: Secondary | ICD-10-CM | POA: Diagnosis present

## 2021-09-18 DIAGNOSIS — I2510 Atherosclerotic heart disease of native coronary artery without angina pectoris: Secondary | ICD-10-CM | POA: Insufficient documentation

## 2021-09-18 DIAGNOSIS — R0789 Other chest pain: Secondary | ICD-10-CM | POA: Diagnosis not present

## 2021-09-18 DIAGNOSIS — R202 Paresthesia of skin: Secondary | ICD-10-CM | POA: Diagnosis not present

## 2021-09-18 LAB — CBC WITH DIFFERENTIAL/PLATELET
Abs Immature Granulocytes: 0.03 10*3/uL (ref 0.00–0.07)
Basophils Absolute: 0.1 10*3/uL (ref 0.0–0.1)
Basophils Relative: 1 %
Eosinophils Absolute: 0.5 10*3/uL (ref 0.0–0.5)
Eosinophils Relative: 7 %
HCT: 41.1 % (ref 39.0–52.0)
Hemoglobin: 14.9 g/dL (ref 13.0–17.0)
Immature Granulocytes: 1 %
Lymphocytes Relative: 28 %
Lymphs Abs: 1.8 10*3/uL (ref 0.7–4.0)
MCH: 33.7 pg (ref 26.0–34.0)
MCHC: 36.3 g/dL — ABNORMAL HIGH (ref 30.0–36.0)
MCV: 93 fL (ref 80.0–100.0)
Monocytes Absolute: 0.6 10*3/uL (ref 0.1–1.0)
Monocytes Relative: 9 %
Neutro Abs: 3.7 10*3/uL (ref 1.7–7.7)
Neutrophils Relative %: 54 %
Platelets: 148 10*3/uL — ABNORMAL LOW (ref 150–400)
RBC: 4.42 MIL/uL (ref 4.22–5.81)
RDW: 12.9 % (ref 11.5–15.5)
WBC: 6.6 10*3/uL (ref 4.0–10.5)
nRBC: 0 % (ref 0.0–0.2)

## 2021-09-18 LAB — BASIC METABOLIC PANEL
Anion gap: 7 (ref 5–15)
BUN: 13 mg/dL (ref 8–23)
CO2: 25 mmol/L (ref 22–32)
Calcium: 8.8 mg/dL — ABNORMAL LOW (ref 8.9–10.3)
Chloride: 108 mmol/L (ref 98–111)
Creatinine, Ser: 1.16 mg/dL (ref 0.61–1.24)
GFR, Estimated: >60 mL/min (ref >60)
Glucose, Bld: 102 mg/dL — ABNORMAL HIGH (ref 70–99)
Potassium: 4 mmol/L (ref 3.5–5.1)
Sodium: 140 mmol/L (ref 135–145)

## 2021-09-18 LAB — TROPONIN I (HIGH SENSITIVITY): Troponin I (High Sensitivity): 3 ng/L (ref <18)

## 2021-09-18 MED ORDER — IOHEXOL 350 MG/ML SOLN: 75.0000 mL | Freq: Once | INTRAVENOUS | Status: DC | PRN

## 2021-09-18 MED ORDER — IOHEXOL 350 MG/ML SOLN
150.0000 mL | Freq: Once | INTRAVENOUS | Status: AC | PRN
  Administered 2021-09-18: 150 mL via INTRAVENOUS

## 2021-09-18 NOTE — ED Triage Notes (Signed)
Pt with left shoulder numbness and tingling that goes down left arm.   Pt sates started at 1700 today. Pt denies any SOB or N/V/D.

## 2021-09-18 NOTE — ED Provider Notes (Signed)
[facility] EMERGENCY DEPARTMENT Provider Note   CSN: [id number] Arrival date & time: 09/18/21  2019     History  Chief Complaint  Patient presents with   Chest Pain   Numbness    Dennis [name] is a 71 y.o. male.  HPI 71 year old male presents with left arm tingling and numbness.  On and off since around 5 PM.  Still seems to be present currently.  When it first happened he did notice a little bit of chest discomfort from shoulder to shoulder though that was transient and gone.  The left arm numbness is calm a dozen or so times.  No weakness associated with it.  No head or neck pain.  No leg symptoms. No dyspnea.   History of hypertension, CAD, Hypercholesterolemia.  Home Medications Prior to Admission medications   Medication Sig Start Date End Date Taking? Authorizing Provider  acetaminophen (TYLENOL) 500 MG tablet Take 1,000 mg by mouth every 6 (six) hours as needed for mild pain or headache.     [provider]  aspirin 81 MG EC tablet Take 81 mg by mouth at bedtime.    [provider]  atorvastatin (LIPITOR) 10 MG tablet Take 10 mg by mouth at bedtime.  04/02/14   [provider]  isosorbide mononitrate (IMDUR) 60 MG 24 hr tablet TAKE 1 TABLET BY MOUTH AT BEDTIME 08/05/21   Arnoldo Lenis, MD  nitroGLYCERIN (NITROSTAT) 0.4 MG SL tablet Place 1 tablet under the tongue every 5 (five) minutes x 3 doses as needed for chest pain. If no relief after 2nd dose, proceed to the ED for an evaluation or call 911 01/23/20   [provider]  pantoprazole (PROTONIX) 40 MG tablet 40 mg daily. 04/19/18   [provider]  tamsulosin (FLOMAX) 0.4 MG CAPS capsule Take 0.4 mg by mouth daily.  03/12/16   [provider]  valsartan (DIOVAN) 320 MG tablet Take 320 mg by mouth daily.    [provider]  verapamil (CALAN-SR) 180 MG CR tablet Take 180 mg by mouth every morning.    [provider]  vitamin B-12 (CYANOCOBALAMIN)  500 MCG tablet Take 500 mcg by mouth daily.    [provider]      Allergies    Patient has no known allergies.    Review of Systems   Review of Systems  Respiratory:  Negative for shortness of breath.   Cardiovascular:  Positive for chest pain.  Musculoskeletal:  Negative for neck pain.  Neurological:  Positive for numbness. Negative for weakness and headaches.    Physical Exam Updated Vital Signs BP (!) 142/86   Pulse (!) 58   Temp 97.8 F (36.6 C) (Oral)   Resp 14   Ht '5\' 6"'$  (1.676 m)   Wt 86.2 kg   SpO2 97%   BMI 30.67 kg/m  Physical Exam Vitals and nursing note reviewed.  Constitutional:      Appearance: He is well-developed.  HENT:     Head: Normocephalic and atraumatic.  Cardiovascular:     Rate and Rhythm: Normal rate and regular rhythm.     Pulses:          Radial pulses are 2+ on the right side.     Heart sounds: Normal heart sounds.     Comments: Difficult to find left radial pulse but has a strong ulnar pulse. Pulmonary:     Effort: Pulmonary effort is normal.     Breath sounds: Normal breath  sounds.  Abdominal:     Palpations: Abdomen is soft.     Tenderness: There is no abdominal tenderness.  Skin:    General: Skin is warm and dry.  Neurological:     Mental Status: He is alert.     ED Results / Procedures / Treatments   Labs (all labs ordered are listed, but only abnormal results are displayed) Labs Reviewed  BASIC METABOLIC PANEL - Abnormal; Notable for the following components:      Result Value   Glucose, Bld 102 (*)    Calcium 8.8 (*)    All other components within normal limits  CBC WITH DIFFERENTIAL/PLATELET - Abnormal; Notable for the following components:   MCHC 36.3 (*)    Platelets 148 (*)    All other components within normal limits  TROPONIN I (HIGH SENSITIVITY)  TROPONIN I (HIGH SENSITIVITY)    EKG EKG Interpretation  Date/Time:  Wednesday September 18 2021 21:10:29 EDT Ventricular Rate:  63 PR  Interval:  169 QRS Duration: 89 QT Interval:  411 QTC Calculation: 421 R Axis:   34 Text Interpretation: Sinus rhythm Low voltage, precordial leads similar to 2019 Confirmed by Sherwood Gambler [id number]) on 09/18/2021 9:42:51 PM  Radiology DG Chest 2 View  Result Date: 09/18/2021 CLINICAL DATA:  Chest pain. EXAM: CHEST - 2 VIEW COMPARISON:  Chest x-ray 08/19/2015 FINDINGS: The heart size and mediastinal contours are within normal limits. Both lungs are clear. The visualized skeletal structures are unremarkable. IMPRESSION: No active cardiopulmonary disease. Electronically Signed   By: Ronney Asters M.D.   On: 09/18/2021 22:00    Procedures Procedures    Medications Ordered in ED Medications - No data to display  ED Course/ Medical Decision Making/ A&P                          Medical Decision Making Amount and/or Complexity of Data Reviewed Labs: ordered. Radiology: ordered.  Risk Prescription drug management.   Patient presents with on and off left arm numbness. Did have transient chest pain across shoulders, but was brief and now it's just the arm. His ulnar pulse in left arm is strong, but weak radial. Unclear if this is clinically significant. Is outside of a code stroke window, but with his risk factors (prior MI, HTN, HLD) I think CTA head/neck and CTA arm are warranted. No weakness appreciated. Labs show normal troponin. EKG no ischemia. Care to Dr. Karle Starch.        Final Clinical Impression(s) / ED Diagnoses Final diagnoses:  None    Rx / DC Orders ED Discharge Orders     None         Sherwood Gambler, MD 09/19/21 1358

## 2021-09-19 LAB — TROPONIN I (HIGH SENSITIVITY): Troponin I (High Sensitivity): 2 ng/L (ref <18)

## 2021-09-19 NOTE — ED Provider Notes (Signed)
Care of the patient assumed at the change of shift. Here for paresthesia of LUE, no loss of sensation or weakness. Off and on for several hours. No focal neuro deficits. Awaiting CTA to eval cerebral and LUE circulation.  Physical Exam  BP 130/73   Pulse (!) 58   Temp 97.8 F (36.6 C) (Oral)   Resp 18   Ht '5\' 6"'$  (1.676 m)   Wt 86.2 kg   SpO2 98%   BMI 30.67 kg/m   Physical Exam  Procedures  Procedures  ED Course / MDM   Clinical Course as of 09/19/21 0156  Thu Sep 19, 2021  0016 I personally viewed the images from radiology studies and agree with radiologist interpretation: CTA of upper extremity neg for acute occlusion.   [CS]    Clinical Course User Index [CS] Truddie Hidden, MD   Medical Decision Making Problems Addressed: Paresthesia: acute illness or injury  Amount and/or Complexity of Data Reviewed Labs: ordered. Radiology: ordered and independent interpretation performed.  Risk Prescription drug management.   Patient reports symptoms have resolved. His description is most consistent with a paresthesia, possible a cervical radiculopathy. No signs of central process such as stroke or vascular compromise. He is asymptomatic now and would like to go home. He may need MRI of C-spine if symptoms return. Recommend close outpatient PCP follow up. RTED for any worsening or change in symptoms.        Truddie Hidden, MD 09/19/21 0157

## 2021-10-21 ENCOUNTER — Ambulatory Visit: Payer: Medicare HMO | Admitting: Cardiology

## 2021-10-21 ENCOUNTER — Encounter: Payer: Self-pay | Admitting: Cardiology

## 2021-10-21 VITALS — BP 120/78 | HR 98 | Ht 66.0 in | Wt 195.0 lb

## 2021-10-21 DIAGNOSIS — E782 Mixed hyperlipidemia: Secondary | ICD-10-CM

## 2021-10-21 DIAGNOSIS — I2510 Atherosclerotic heart disease of native coronary artery without angina pectoris: Secondary | ICD-10-CM | POA: Diagnosis not present

## 2021-10-21 DIAGNOSIS — I10 Essential (primary) hypertension: Secondary | ICD-10-CM | POA: Diagnosis not present

## 2021-10-21 DIAGNOSIS — R0602 Shortness of breath: Secondary | ICD-10-CM

## 2021-10-21 DIAGNOSIS — R0609 Other forms of dyspnea: Secondary | ICD-10-CM

## 2021-10-21 NOTE — Progress Notes (Signed)
Clinical Summary Mr. Sheetz is a 71 y.o.male seen today for follow up of the following medical problems.    1. CAD   - cath 06/2007 in the setting of unstable angina, PCI with DES to LAD,PTCA to diagonal, and DES to RCA LV gram LVEF 60%   - 01/2013 Stress MPI low risk study, no ischemia - 01/2013 echo LVEF 60-65%, grade II diastolic dysfunction - 12/1692 exercise nuclear stress test: no ischemia       08/2017 cath moderate CAD as reported below, patent stents      06/2020 nuclear stress: no ischemia  - nonspecific mild chest tightness at times, infrequent.  - some DOE with activities. No recent edema.  - compliant with meds      2. Dizziness  - orthostatic at prior clinic visit. Symptoms have improved off HCTZ and with increaesd hydration.  -rare infrequent symptoms.      3. HTN - he is compliant with meds   4. Hyperlipidemia -  prior mylagias on lipitor 20 mg daily, he had done well on  lipitor '10mg'$  daily.       06/2018 TC 133 TG 207 HDL 36 LDL 56 - 07/2020 TC 121 TG 144 HDL 25 LDL 70 - 07/2021 TC 136 TG 160 HDL 37 LDL 71      5.Left arm numbness - ER visit 09/2021  - CT imaging negative for acute cerebral process - left arm CTA without signifciatn disease - followed up with pcp          SH: often travels to [place]. Recent traveled to [place]. Enjoys classic cars. He was good friends with a patient of mine who recently passed away [name]. He still remains in touch with his wife [name] who is also a patient of mine.    Has place [place] he often visit Family owns house near 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 s/p DES to LAD with DES to RCA, and PTCA to D1 in 06/2007 b. 08/2017: cath showing patent stents with 70% D1, 50% LCx, 60% mid-RCA, and 60% Acute Mrg stenosis. Medical therapy recommended.    GERD  (gastroesophageal reflux disease)    Gout    Hypercholesteremia    Hypertension    Myocardial infarction ([place]) 1998     No Known Allergies   Current Outpatient Medications  Medication Sig Dispense Refill   acetaminophen (TYLENOL) 500 MG tablet Take 1,000 mg by mouth every 6 (six) hours as needed for mild pain or headache.      aspirin 81 MG EC tablet Take 81 mg by mouth at bedtime.     atorvastatin (LIPITOR) 10 MG tablet Take 10 mg by mouth at bedtime.   1   isosorbide mononitrate (IMDUR) 60 MG 24 hr tablet TAKE 1 TABLET BY MOUTH AT BEDTIME 90 tablet 1   nitroGLYCERIN (NITROSTAT) 0.4 MG SL tablet Place 1 tablet under the tongue every 5 (five) minutes x 3 doses as needed for chest pain. If no relief after 2nd dose, proceed to the ED for an evaluation or call 911     pantoprazole (PROTONIX) 40 MG tablet 40 mg daily.     tamsulosin (FLOMAX) 0.4 MG CAPS capsule Take 0.4 mg by mouth daily.      valsartan (DIOVAN) 320 MG tablet  Take 320 mg by mouth daily.     verapamil (CALAN-SR) 180 MG CR tablet Take 180 mg by mouth every morning.     vitamin B-12 (CYANOCOBALAMIN) 500 MCG tablet Take 500 mcg by mouth daily.     No current facility-administered medications for this visit.     Past Surgical History:  Procedure Laterality Date   bilateral eye surgery     CARDIAC CATHETERIZATION  2009   PCI with DES to LAD,PTCA to diagonal, and DES to RCA LV gram LVEF 60%    CARDIAC SURGERY     COLONOSCOPY     COLONOSCOPY  05/14/2011   Procedure: COLONOSCOPY;  Surgeon: Daneil Dolin, MD;  [facility];  Service: Endoscopy;  Laterality: N/A;  8:15 AM   LEFT HEART CATH AND CORONARY ANGIOGRAPHY N/A 08/14/2017   Procedure: LEFT HEART CATH AND CORONARY ANGIOGRAPHY;  Surgeon: Martinique, Peter M, MD;  Location: [facility];  Service: Cardiovascular;  Laterality: N/A;     No Known Allergies    Family History  Problem Relation Age of Onset   Stroke Mother    Heart attack Father    Colon  cancer Neg Hx      Social History Mr. Hence reports that he has never smoked. He has never used smokeless tobacco. Mr. Ostermiller reports current alcohol use of about 5.0 standard drinks of alcohol per week.   Review of Systems CONSTITUTIONAL: No weight loss, fever, chills, weakness or fatigue.  HEENT: Eyes: No visual loss, blurred vision, double vision or yellow sclerae.No hearing loss, sneezing, congestion, runny nose or sore throat.  SKIN: No rash or itching.  CARDIOVASCULAR: per hpi RESPIRATORY: No shortness of breath, cough or sputum.  GASTROINTESTINAL: No anorexia, nausea, vomiting or diarrhea. No abdominal pain or blood.  GENITOURINARY: No burning on urination, no polyuria NEUROLOGICAL: No headache, dizziness, syncope, paralysis, ataxia, numbness or tingling in the extremities. No change in bowel or bladder control.  MUSCULOSKELETAL: No muscle, back pain, joint pain or stiffness.  LYMPHATICS: No enlarged nodes. No history of splenectomy.  PSYCHIATRIC: No history of depression or anxiety.  ENDOCRINOLOGIC: No reports of sweating, cold or heat intolerance. No polyuria or polydipsia.  Marland Kitchen   Physical Examination Today's Vitals   10/21/21 0823  BP: 120/78  Pulse: 98  SpO2: 95%  Weight: 195 lb (88.5 kg)  Height: '5\' 6"'$  (1.676 m)   Body mass index is 31.47 kg/m.  Gen: resting comfortably, no acute distress HEENT: no scleral icterus, pupils equal round and reactive, no palptable cervical adenopathy,  CV: RRR, no m/r/g no jvd Resp: Clear to auscultation bilaterally GI: abdomen is soft, non-tender, non-distended, normal bowel sounds, no hepatosplenomegaly MSK: extremities are warm, no edema.  Skin: warm, no rash Neuro:  no focal deficits Psych: appropriate affect   Diagnostic Studies  01/2013 Stress MPI Analysis of the raw perfusion data finds radiotracer uptake in the gut adjacent to the inferior wall.  Tomographic views were obtained using the short axis, vertical  long axis, and horizontal long axis planes. There is a small, mild to moderate intensity, inferior perfusion defect that is fixed. No significant reversible defects to indicate ischemia.  Gated imaging reveals an EDV of 70, ESV of 24, normal TID ratio of 0.89, and LVEF of 65% without wall motion abnormality.  IMPRESSION: Low risk exercise Cardiolite. No chest pain reported, there were no diagnostic ST segment changes at maximum work load of 10.1 METS. Perfusion imaging is most consistent with inferior wall  attenuation, no definite scar or ischemic defects. LV volumes are normal with LVEF 65% and no focal wall motion abnormalities.   01/2013 Echo Study Conclusions  - Left ventricle: The cavity size was normal. There was mild   concentric hypertrophy. Systolic function was normal. The   estimated ejection fraction was in the range of 60% to   65%. Wall motion was normal; there were no regional wall   motion abnormalities. Features are consistent with a   pseudonormal left ventricular filling pattern, with   concomitant abnormal relaxation and increased filling   pressure (grade 2 diastolic dysfunction). - Mitral valve: Trivial regurgitation. - Left atrium: The atrium was at the upper limits of normal   in size. - Tricuspid valve: Physiologic regurgitation. - Pulmonary arteries: Systolic pressure could not be   accurately estimated. - Inferior vena cava: Poorly visualized. Unable to estimate   CVP. - Pericardium, extracardiac: There was no pericardial   effusion. Impressions:  - Comparison to prior study March 2009. Mild LVH with LVEF   52-77%, grade 2 diastolic dysfunction. Upper normal left   atrial size. Trivial mitral and tricuspid regurgitation.   Unable to assess PASP or CVP.      08/2015 MPI There was no ST segment deviation noted during stress. Findings consistent with prior inferior myocardial infarction. There is no current ischemia This is a low risk study. The  left ventricular ejection fraction is hyperdynamic (>65%). Duke treadmill score of 5, consistent with low risk for major cardiac events   08/2017 cath Previously placed Prox LAD to Mid LAD stent (unknown type) is widely patent. 1st Diag lesion is 70% stenosed. Mid Cx to Dist Cx lesion is 50% stenosed. Previously placed Prox RCA to Mid RCA stent (unknown type) is widely patent. Prox RCA lesion is 40% stenosed. Acute Mrg lesion is 60% stenosed. Mid RCA lesion is 60% stenosed. The left ventricular systolic function is normal. LV end diastolic pressure is normal. The left ventricular ejection fraction is 55-65% by visual estimate.   1. Modest 3 vessel CAD    - 70% first diagonal    - 50% long distal LCx    - 60% mid RCA and RV marginal Deontray Hunnicutt 2. Continued excellent patency of stents in the LAD and RCA 3. Normal LV function   Plan: I do not see any critical disease that would explain his resting chest pain. The first diagonal was severely stenosed post stenting of the LAD and this has improved. I would recommend continued medical therapy   06/2020 nuclear stress There was no ST segment deviation noted during stress. No T wave inversion was noted during stress. Findings consistent with prior inferior myocardial infarction. There is no current ischemia This is a low risk study. The left ventricular ejection fraction is hyperdynamic (>65%).   Assessment and Plan  1. CAD -some occasonal DOE, will repeat echo. Stress test last year was benign - continue current meds   2. HTN - he is at goal, continue curren tmeds     3. Hyperlipideima -. Did not tolerate higher dosing, he is on just atorva '10mg'$  daily - LDL reasonable given statin limitations, if uptrend could consider adding zetia   4.DOE - obtain echo   Arnoldo Lenis, M.D.

## 2021-10-21 NOTE — Patient Instructions (Addendum)
Medication Instructions:  Your physician recommends that you continue on your current medications as directed. Please refer to the Current Medication list given to you today.   Labwork: None  Testing/Procedures: Your physician has requested that you have an echocardiogram. Echocardiography is a painless test that uses sound waves to create images of your heart. It provides your doctor with information about the size and shape of your heart and how well your heart's chambers and valves are working. This procedure takes approximately one hour. There are no restrictions for this procedure.   Follow-Up: Follow up with Dr. Harl Bowie in 6 months.   Any Other Special Instructions Will Be Listed Below (If Applicable).     If you need a refill on your cardiac medications before your next appointment, please call your pharmacy.

## 2021-10-25 ENCOUNTER — Ambulatory Visit: Payer: Medicare HMO | Admitting: Cardiology

## 2021-10-31 ENCOUNTER — Ambulatory Visit (HOSPITAL_COMMUNITY)
Admission: RE | Admit: 2021-10-31 | Discharge: 2021-10-31 | Disposition: A | Discharge status: Home / Self Care | Payer: Medicare HMO | Source: Ambulatory Visit | Attending: Cardiology | Admitting: Cardiology

## 2021-10-31 DIAGNOSIS — R0602 Shortness of breath: Secondary | ICD-10-CM | POA: Insufficient documentation

## 2021-10-31 LAB — ECHOCARDIOGRAM COMPLETE
Area-P 1/2: 2.91 cm2
P 1/2 time: 406 msec
S' Lateral: 2.7 cm

## 2021-10-31 NOTE — Progress Notes (Signed)
*  PRELIMINARY RESULTS* Echocardiogram 2D Echocardiogram has been performed.  [name] 10/31/2021, 11:14 AM

## 2021-11-11 NOTE — Progress Notes (Signed)
Patient made aware, verbalized understanding

## 2021-11-13 ENCOUNTER — Ambulatory Visit: Payer: Medicare HMO | Admitting: Cardiology

## 2022-02-10 ENCOUNTER — Encounter: Payer: Self-pay | Admitting: *Deleted

## 2022-04-07 ENCOUNTER — Other Ambulatory Visit: Payer: Self-pay | Admitting: Cardiology

## 2022-04-30 ENCOUNTER — Encounter: Payer: Self-pay | Admitting: Cardiology

## 2022-04-30 ENCOUNTER — Ambulatory Visit: Payer: Medicare HMO | Attending: Cardiology | Admitting: Cardiology

## 2022-04-30 VITALS — BP 122/80 | HR 66 | Ht 66.0 in | Wt 198.2 lb

## 2022-04-30 DIAGNOSIS — I2510 Atherosclerotic heart disease of native coronary artery without angina pectoris: Secondary | ICD-10-CM | POA: Diagnosis not present

## 2022-04-30 DIAGNOSIS — E782 Mixed hyperlipidemia: Secondary | ICD-10-CM | POA: Diagnosis not present

## 2022-04-30 DIAGNOSIS — I10 Essential (primary) hypertension: Secondary | ICD-10-CM

## 2022-04-30 NOTE — Patient Instructions (Signed)
Medication Instructions:  Your physician recommends that you continue on your current medications as directed. Please refer to the Current Medication list given to you today.   Labwork: None  Testing/Procedures: None  Follow-Up: Follow up with Dr. Branch in 6 months.   Any Other Special Instructions Will Be Listed Below (If Applicable).     If you need a refill on your cardiac medications before your next appointment, please call your pharmacy.  

## 2022-04-30 NOTE — Progress Notes (Signed)
Clinical Summary Mr. Seliga is a 72 y.o.male seen today for follow up of the following medical problems.    1. CAD   - cath 06/2007 in the setting of unstable angina, PCI with DES to LAD,PTCA to diagonal, and DES to RCA LV gram LVEF 60%   - 01/2013 Stress MPI low risk study, no ischemia - 01/2013 echo LVEF 60-65%, grade II diastolic dysfunction - 07/5407 exercise nuclear stress test: no ischemia       08/2017 cath moderate CAD as reported below, patent stents      06/2020 nuclear stress: no ischemia   - no recent chest pain, no SOB/DOE - compliant with meds      2. Dizziness  - orthostatic at prior clinic visit. Symptoms have improved off HCTZ and with increaesd hydration.      3. HTN - he is compliant with meds   4. Hyperlipidemia -  prior mylagias on lipitor 20 mg daily, he had done well on  lipitor '10mg'$  daily.       06/2018 TC 133 TG 207 HDL 36 LDL 56 - 07/2020 TC 121 TG 144 HDL 25 LDL 70 - 07/2021 TC 136 TG 160 HDL 37 LDL 71   -upcoming labs with pcp    5.Left arm numbness - ER visit 09/2021  - CT imaging negative for acute cerebral process - left arm CTA without signifciatn disease - followed up with pcp          SH: often travels to [place]. Recent traveled to [place]. Enjoys classic cars. He was good friends with a patient of mine who recently passed away [name]. He still remains in touch with his wife [name] who is also a patient of mine.    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 s/p DES to LAD with DES to RCA, and PTCA to D1 in 06/2007 b. 08/2017: cath showing patent stents with 70% D1, 50% LCx, 60% mid-RCA, and 60% Acute Mrg stenosis. Medical therapy recommended.    GERD (gastroesophageal reflux  disease)    Gout    Hypercholesteremia    Hypertension    Myocardial infarction ([place]) 1998     No Known Allergies   Current Outpatient Medications  Medication Sig Dispense Refill   acetaminophen (TYLENOL) 500 MG tablet Take 1,000 mg by mouth every 6 (six) hours as needed for mild pain or headache.      aspirin 81 MG EC tablet Take 81 mg by mouth at bedtime.     atorvastatin (LIPITOR) 10 MG tablet Take 10 mg by mouth at bedtime.   1   isosorbide mononitrate (IMDUR) 60 MG 24 hr tablet TAKE 1 TABLET BY MOUTH AT BEDTIME 90 tablet 0   nitroGLYCERIN (NITROSTAT) 0.4 MG SL tablet Place 1 tablet under the tongue every 5 (five) minutes x 3 doses as needed for chest pain. If no relief after 2nd dose, proceed to the ED for an evaluation or call 911     pantoprazole (PROTONIX) 40 MG tablet 40 mg daily.     tamsulosin (FLOMAX) 0.4 MG CAPS capsule Take 0.4 mg by mouth daily.      valsartan (DIOVAN) 320 MG  tablet Take 320 mg by mouth daily.     verapamil (CALAN-SR) 180 MG CR tablet Take 180 mg by mouth every morning.     vitamin B-12 (CYANOCOBALAMIN) 500 MCG tablet Take 500 mcg by mouth daily.     No current facility-administered medications for this visit.     Past Surgical History:  Procedure Laterality Date   bilateral eye surgery     CARDIAC CATHETERIZATION  2009   PCI with DES to LAD,PTCA to diagonal, and DES to RCA LV gram LVEF 60%    CARDIAC SURGERY     COLONOSCOPY     COLONOSCOPY  05/14/2011   Procedure: COLONOSCOPY;  Surgeon: Daneil Dolin, MD;  [facility];  Service: Endoscopy;  Laterality: N/A;  8:15 AM   LEFT HEART CATH AND CORONARY ANGIOGRAPHY N/A 08/14/2017   Procedure: LEFT HEART CATH AND CORONARY ANGIOGRAPHY;  Surgeon: Martinique, Peter M, MD;  Location: [facility];  Service: Cardiovascular;  Laterality: N/A;     No Known Allergies    Family History  Problem Relation Age of Onset   Stroke Mother    Heart attack Father    Colon cancer Neg Hx       Social History Mr. Wenner reports that he has never smoked. He has never used smokeless tobacco. Mr. Footman reports current alcohol use of about 5.0 standard drinks of alcohol per week.   Review of Systems CONSTITUTIONAL: No weight loss, fever, chills, weakness or fatigue.  HEENT: Eyes: No visual loss, blurred vision, double vision or yellow sclerae.No hearing loss, sneezing, congestion, runny nose or sore throat.  SKIN: No rash or itching.  CARDIOVASCULAR: per hpi RESPIRATORY: No shortness of breath, cough or sputum.  GASTROINTESTINAL: No anorexia, nausea, vomiting or diarrhea. No abdominal pain or blood.  GENITOURINARY: No burning on urination, no polyuria NEUROLOGICAL: No headache, dizziness, syncope, paralysis, ataxia, numbness or tingling in the extremities. No change in bowel or bladder control.  MUSCULOSKELETAL: No muscle, back pain, joint pain or stiffness.  LYMPHATICS: No enlarged nodes. No history of splenectomy.  PSYCHIATRIC: No history of depression or anxiety.  ENDOCRINOLOGIC: No reports of sweating, cold or heat intolerance. No polyuria or polydipsia.  Marland Kitchen   Physical Examination Today's Vitals   04/30/22 1010  BP: 122/80  Pulse: 66  SpO2: 95%  Weight: 198 lb 3.2 oz (89.9 kg)  Height: '5\' 6"'$  (1.676 m)   Body mass index is 31.99 kg/m.  Gen: resting comfortably, no acute distress HEENT: no scleral icterus, pupils equal round and reactive, no palptable cervical adenopathy,  CV: RRR, no m/r/g, no jvd Resp: Clear to auscultation bilaterally GI: abdomen is soft, non-tender, non-distended, normal bowel sounds, no hepatosplenomegaly MSK: extremities are warm, no edema.  Skin: warm, no rash Neuro:  no focal deficits Psych: appropriate affect   Diagnostic Studies 01/2013 Stress MPI Analysis of the raw perfusion data finds radiotracer uptake in the gut adjacent to the inferior wall.  Tomographic views were obtained using the short axis, vertical long axis,  and horizontal long axis planes. There is a small, mild to moderate intensity, inferior perfusion defect that is fixed. No significant reversible defects to indicate ischemia.  Gated imaging reveals an EDV of 70, ESV of 24, normal TID ratio of 0.89, and LVEF of 65% without wall motion abnormality.  IMPRESSION: Low risk exercise Cardiolite. No chest pain reported, there were no diagnostic ST segment changes at maximum work load of 10.1 METS. Perfusion imaging is most consistent with  inferior wall attenuation, no definite scar or ischemic defects. LV volumes are normal with LVEF 65% and no focal wall motion abnormalities.   01/2013 Echo Study Conclusions  - Left ventricle: The cavity size was normal. There was mild   concentric hypertrophy. Systolic function was normal. The   estimated ejection fraction was in the range of 60% to   65%. Wall motion was normal; there were no regional wall   motion abnormalities. Features are consistent with a   pseudonormal left ventricular filling pattern, with   concomitant abnormal relaxation and increased filling   pressure (grade 2 diastolic dysfunction). - Mitral valve: Trivial regurgitation. - Left atrium: The atrium was at the upper limits of normal   in size. - Tricuspid valve: Physiologic regurgitation. - Pulmonary arteries: Systolic pressure could not be   accurately estimated. - Inferior vena cava: Poorly visualized. Unable to estimate   CVP. - Pericardium, extracardiac: There was no pericardial   effusion. Impressions:  - Comparison to prior study March 2009. Mild LVH with LVEF   84-16%, grade 2 diastolic dysfunction. Upper normal left   atrial size. Trivial mitral and tricuspid regurgitation.   Unable to assess PASP or CVP.      08/2015 MPI There was no ST segment deviation noted during stress. Findings consistent with prior inferior myocardial infarction. There is no current ischemia This is a low risk study. The left  ventricular ejection fraction is hyperdynamic (>65%). Duke treadmill score of 5, consistent with low risk for major cardiac events   08/2017 cath Previously placed Prox LAD to Mid LAD stent (unknown type) is widely patent. 1st Diag lesion is 70% stenosed. Mid Cx to Dist Cx lesion is 50% stenosed. Previously placed Prox RCA to Mid RCA stent (unknown type) is widely patent. Prox RCA lesion is 40% stenosed. Acute Mrg lesion is 60% stenosed. Mid RCA lesion is 60% stenosed. The left ventricular systolic function is normal. LV end diastolic pressure is normal. The left ventricular ejection fraction is 55-65% by visual estimate.   1. Modest 3 vessel CAD    - 70% first diagonal    - 50% long distal LCx    - 60% mid RCA and RV marginal Donie Lemelin 2. Continued excellent patency of stents in the LAD and RCA 3. Normal LV function   Plan: I do not see any critical disease that would explain his resting chest pain. The first diagonal was severely stenosed post stenting of the LAD and this has improved. I would recommend continued medical therapy   06/2020 nuclear stress There was no ST segment deviation noted during stress. No T wave inversion was noted during stress. Findings consistent with prior inferior myocardial infarction. There is no current ischemia This is a low risk study. The left ventricular ejection fraction is hyperdynamic (>65%).    Assessment and Plan  1. CAD -no recent symptoms, continue current meds   2. HTN - at goal, continue current meds     3. Hyperlipideima -. Did not tolerate higher dosing, he is on just atorva '10mg'$  daily - LDL reasonable given statin limitations, if uptrend could consider adding zetia - we will continue current meds, upcoming labs with pcp         Arnoldo Lenis, M.D.

## 2022-07-07 ENCOUNTER — Other Ambulatory Visit: Payer: Self-pay | Admitting: Cardiology

## 2022-09-01 ENCOUNTER — Emergency Department (HOSPITAL_COMMUNITY)
Admission: EM | Admit: 2022-09-01 | Discharge: 2022-09-01 | Disposition: A | Discharge status: Home / Self Care | Payer: Medicare HMO | Attending: Emergency Medicine | Admitting: Emergency Medicine

## 2022-09-01 ENCOUNTER — Other Ambulatory Visit: Payer: Self-pay

## 2022-09-01 ENCOUNTER — Emergency Department (HOSPITAL_COMMUNITY): Payer: Medicare HMO

## 2022-09-01 ENCOUNTER — Encounter (HOSPITAL_COMMUNITY): Payer: Self-pay

## 2022-09-01 DIAGNOSIS — D696 Thrombocytopenia, unspecified: Secondary | ICD-10-CM | POA: Diagnosis not present

## 2022-09-01 DIAGNOSIS — R531 Weakness: Secondary | ICD-10-CM | POA: Insufficient documentation

## 2022-09-01 DIAGNOSIS — Z7982 Long term (current) use of aspirin: Secondary | ICD-10-CM | POA: Diagnosis not present

## 2022-09-01 DIAGNOSIS — R0789 Other chest pain: Secondary | ICD-10-CM | POA: Diagnosis present

## 2022-09-01 DIAGNOSIS — R079 Chest pain, unspecified: Secondary | ICD-10-CM

## 2022-09-01 DIAGNOSIS — I2510 Atherosclerotic heart disease of native coronary artery without angina pectoris: Secondary | ICD-10-CM | POA: Diagnosis not present

## 2022-09-01 LAB — BASIC METABOLIC PANEL
Anion gap: 13 (ref 5–15)
BUN: 14 mg/dL (ref 8–23)
CO2: 19 mmol/L — ABNORMAL LOW (ref 22–32)
Calcium: 9 mg/dL (ref 8.9–10.3)
Chloride: 107 mmol/L (ref 98–111)
Creatinine, Ser: 0.84 mg/dL (ref 0.61–1.24)
GFR, Estimated: >60 mL/min (ref >60)
Glucose, Bld: 98 mg/dL (ref 70–99)
Potassium: 4.7 mmol/L (ref 3.5–5.1)
Sodium: 139 mmol/L (ref 135–145)

## 2022-09-01 LAB — CBC
HCT: 39.6 % (ref 39.0–52.0)
Hemoglobin: 13.7 g/dL (ref 13.0–17.0)
MCH: 33.3 pg (ref 26.0–34.0)
MCHC: 34.6 g/dL (ref 30.0–36.0)
MCV: 96.1 fL (ref 80.0–100.0)
Platelets: 123 10*3/uL — ABNORMAL LOW (ref 150–400)
RBC: 4.12 MIL/uL — ABNORMAL LOW (ref 4.22–5.81)
RDW: 13.5 % (ref 11.5–15.5)
WBC: 6.5 10*3/uL (ref 4.0–10.5)
nRBC: 0 % (ref 0.0–0.2)

## 2022-09-01 LAB — TROPONIN I (HIGH SENSITIVITY)
Troponin I (High Sensitivity): 3 ng/L (ref <18)
Troponin I (High Sensitivity): 3 ng/L (ref <18)

## 2022-09-01 NOTE — ED Provider Notes (Signed)
[place] EMERGENCY DEPARTMENT AT [facility] Provider Note   CSN: [id number] Arrival date & time: 09/01/22  1027     History  Chief Complaint  Patient presents with   Chest Pain    Dennis [name] is a 72 y.o. male.  Has history of CAD.  He is followed by cardiology.  He had PCI in 2009 with drug-eluting stent in LAD and RCA and angioplasty to the diagonal at that time.    ER complaining of feeling generally unwell.  This started yesterday.  He states it was yesterday in the middle of the day, he reports they were out of town he had chest pain while at rest.  He states he was just sitting down and had chest pain that he states was sharp in the middle of his chest rating out to his sternum for about 5 beats.  He states the pain intensified with each beat of his heart.  It suddenly resolved but he states he felt generally tired and weak yesterday all day and still does not feel well, so he presents the ER. Denies any nausea, abdominal pain, sweating, radiation of the pain or any other complaints.  No shortness of breath.  No cough, no other symptoms.   Chest Pain      Home Medications Prior to Admission medications   Medication Sig Start Date End Date Taking? Authorizing Provider  acetaminophen (TYLENOL) 500 MG tablet Take 1,000 mg by mouth every 6 (six) hours as needed for mild pain or headache.     [provider]  aspirin 81 MG EC tablet Take 81 mg by mouth at bedtime.    [provider]  atorvastatin (LIPITOR) 10 MG tablet Take 10 mg by mouth at bedtime.  04/02/14   [provider]  Cranberry 500 MG CAPS Take by mouth.    [provider]  fexofenadine (ALLEGRA) 180 MG tablet Take by mouth.    [provider]  isosorbide mononitrate (IMDUR) 60 MG 24 hr tablet TAKE 1 TABLET BY MOUTH AT BEDTIME 07/07/22   Antoine Poche, MD  nitroGLYCERIN (NITROSTAT) 0.4 MG SL tablet Place 1 tablet under the tongue every 5 (five) minutes x  3 doses as needed for chest pain. If no relief after 2nd dose, proceed to the ED for an evaluation or call 911 01/23/20   [provider]  pantoprazole (PROTONIX) 40 MG tablet 40 mg daily. 04/19/18   [provider]  tamsulosin (FLOMAX) 0.4 MG CAPS capsule Take 0.4 mg by mouth daily.  03/12/16   [provider]  triamcinolone (NASACORT) 55 MCG/ACT AERO nasal inhaler two sprays by Both Nostrils route daily. PRN    [provider]  valsartan (DIOVAN) 320 MG tablet Take 320 mg by mouth daily.    [provider]  verapamil (CALAN-SR) 180 MG CR tablet Take 180 mg by mouth every morning.    [provider]  vitamin B-12 (CYANOCOBALAMIN) 500 MCG tablet Take 500 mcg by mouth daily.    [provider]      Allergies    Patient has no known allergies.    Review of Systems   Review of Systems  Cardiovascular:  Positive for chest pain.    Physical Exam Updated Vital Signs BP (!) 150/78   Pulse 61   Temp 98.2 F (36.8 C) (Oral)   Resp 12   SpO2 97%  Physical Exam Vitals and nursing note reviewed.  Constitutional:  General: He is not in acute distress.    Appearance: He is well-developed.  HENT:     Head: Normocephalic and atraumatic.  Eyes:     Conjunctiva/sclera: Conjunctivae normal.  Cardiovascular:     Rate and Rhythm: Normal rate and regular rhythm.     Heart sounds: No murmur heard. Pulmonary:     Effort: Pulmonary effort is normal. No respiratory distress.     Breath sounds: Normal breath sounds.  Abdominal:     Palpations: Abdomen is soft.     Tenderness: There is no abdominal tenderness.  Musculoskeletal:        General: No swelling.     Cervical back: Neck supple.     Right lower leg: No tenderness. No edema.     Left lower leg: No tenderness. No edema.  Skin:    General: Skin is warm and dry.     Capillary Refill: Capillary refill takes less than 2 seconds.  Neurological:     General: No focal deficit  present.     Mental Status: He is alert and oriented to person, place, and time.  Psychiatric:        Mood and Affect: Mood normal.     ED Results / Procedures / Treatments   Labs (all labs ordered are listed, but only abnormal results are displayed) Labs Reviewed  BASIC METABOLIC PANEL - Abnormal; Notable for the following components:      Result Value   CO2 19 (*)    All other components within normal limits  CBC - Abnormal; Notable for the following components:   RBC 4.12 (*)    Platelets 123 (*)    All other components within normal limits  TROPONIN I (HIGH SENSITIVITY)  TROPONIN I (HIGH SENSITIVITY)    EKG EKG Interpretation  Date/Time:  Monday Sep 01 2022 10:50:44 EDT Ventricular Rate:  64 PR Interval:  170 QRS Duration: 79 QT Interval:  419 QTC Calculation: 433 R Axis:   39 Text Interpretation: Sinus rhythm Low voltage, precordial leads Baseline wander in lead(s) V3 Confirmed by Gloris Manchester ([id number]) on 09/01/2022 1:15:15 PM  Radiology DG Chest Port 1 View  Result Date: 09/01/2022 CLINICAL DATA:  Chest pain, weakness, and shortness of breath. EXAM: PORTABLE CHEST 1 VIEW COMPARISON:  Chest x-ray dated September 18, 2021. FINDINGS: Stable cardiomediastinal silhouette with normal heart size and hiatal hernia. Normal pulmonary vascularity. No focal consolidation, pleural effusion, or pneumothorax. No acute osseous abnormality. IMPRESSION: 1. No active disease. Electronically Signed   By: Obie Dredge M.D.   On: 09/01/2022 11:49    Procedures Procedures    Medications Ordered in ED Medications - No data to display  ED Course/ Medical Decision Making/ A&P                             Medical Decision Making The patient presented today for chest pain that had started yesterday and has since resolved. EKG showed NSR with no ischemic changes, Chest Xray was independently reviewed by me and shows no acute cardiopulmonary disease. I agree with radiology interpretation.    Patient had no symptoms while in the emergency department and is feeling better on my reevaluation without intervention.  I considered a broad differential including but not limited to ACS, PE, Dissection, pneumothorax, costochondritis, pneumonia, GERD, pericarditis, and pericardial effusion.  PE is considered low risk and he has no current chest pain, no pleurisy or shortness of breath, no  tachypnea or tachycardia, no hypoxia.  No lower extremity swelling or pain, no other VTE risk factors.  I do not feel he needs any further workup for pulmonary embolism  I feel disssection is extremely unlikely  Symptoms and EKG are not consisted with pericarditis  Their heart score is 4. Troponins show no change over 2 hours  Plan is for discharge home to follow-up with his primary care doctor and cardiology outpatient.  History and symptoms not consistent with ACS or anginal pain he has been asymptomatic since yesterday morning, and his intake is improving with no intervention.  Amount and/or Complexity of Data Reviewed Labs: ordered.    Details: Troponin delta of 0, CBC shows mild thrombocytopenia but no anemia or leukocytosis BMP significant for mild decreased CO2 otherwise normal Radiology: ordered.           Final Clinical Impression(s) / ED Diagnoses Final diagnoses:  Chest pain, unspecified type    Rx / DC Orders ED Discharge Orders     None         Josem Kaufmann 09/01/22 1446    Gloris Manchester, MD 09/02/22 1816

## 2022-09-01 NOTE — ED Triage Notes (Signed)
Pt states started having chest pain yesterday, having some tightness today. Also feeling weak and some sob. Denies any n/v.

## 2022-09-01 NOTE — Discharge Instructions (Addendum)
It was a pleasure taking care of you today.  You were evaluated for chest pain.  Your blood work, chest x-ray and EKG were all reassuring.  Please follow-up with your primary care doctor and your heart doctor, come back to the ER if you experience any new or worsening symptoms.  We incidentally did find your platelets are somewhat low, they have been low in the past, recheck with your primary care doctor for this.

## 2022-09-05 ENCOUNTER — Ambulatory Visit: Payer: Medicare HMO | Admitting: Cardiology

## 2022-09-05 ENCOUNTER — Ambulatory Visit: Payer: Medicare HMO | Attending: Cardiology | Admitting: Cardiology

## 2022-09-05 ENCOUNTER — Encounter: Payer: Self-pay | Admitting: Cardiology

## 2022-09-05 VITALS — BP 126/72 | HR 81 | Ht 66.0 in | Wt 198.8 lb

## 2022-09-05 DIAGNOSIS — R079 Chest pain, unspecified: Secondary | ICD-10-CM

## 2022-09-05 DIAGNOSIS — I2510 Atherosclerotic heart disease of native coronary artery without angina pectoris: Secondary | ICD-10-CM

## 2022-09-05 NOTE — Patient Instructions (Signed)
Medication Instructions:  Your physician recommends that you continue on your current medications as directed. Please refer to the Current Medication list given to you today.  *If you need a refill on your cardiac medications before your next appointment, please call your pharmacy*   Lab Work: None If you have labs (blood work) drawn today and your tests are completely normal, you will receive your results only by: MyChart Message (if you have MyChart) OR A paper copy in the mail If you have any lab test that is abnormal or we need to change your treatment, we will call you to review the results.   Testing/Procedures: Your physician has requested that you have a Exercise Nuclear Stress Test. For further information please visit [email or web address] Please follow instruction sheet, as given.    Follow-Up: At [facility], you and your health needs are our priority.  As part of our continuing mission to provide you with exceptional heart care, we have created designated Provider Care Teams.  These Care Teams include your primary Cardiologist (physician) and Advanced Practice Providers (APPs -  Physician Assistants and Nurse Practitioners) who all work together to provide you with the care you need, when you need it.  We recommend signing up for the patient portal called "MyChart".  Sign up information is provided on this After Visit Summary.  MyChart is used to connect with patients for Virtual Visits (Telemedicine).  Patients are able to view lab/test results, encounter notes, upcoming appointments, etc.  Non-urgent messages can be sent to your provider as well.   To learn more about what you can do with MyChart, go to [email or web address].    Your next appointment:    Keep follow up as scheduled.  Provider:   Dina Rich, MD    Other Instructions

## 2022-09-05 NOTE — Progress Notes (Signed)
Clinical Summary Dennis [name] is a 72 y.o.male seen today for follow up of the following medical problems.  Seen today for a focused visit for history of CAD and recent ER visit with chest pain.    1. CAD   - cath 06/2007 in the setting of unstable angina, PCI with DES to LAD,PTCA to diagonal, and DES to RCA LV gram LVEF 60%   - 01/2013 Stress MPI low risk study, no ischemia - 01/2013 echo LVEF 60-65%, grade II diastolic dysfunction - 08/2015 exercise nuclear stress test: no ischemia      08/2017 cath moderate CAD as reported below, patent stents  06/2020 nuclear stress: no ischemia   - ER visit 09/01/22 with chest pain - trops neg x 2. EKG SR no acute ischemic changes   - reports the day before the ER visit had some chest pain, at rest - aching like midchest, 4-5 beats. Throbbing like pain. No other symptoms right at that time. Rest of the day felt drained and fatigue.  - some lighter pain the following day, fatigue. Mild SOB that day.        Other medical problems not addressed this visit    2. Dizziness  - orthostatic at prior clinic visit. Symptoms have improved off HCTZ and with increaesd hydration.      3. HTN - he is compliant with meds   4. Hyperlipidemia -  prior mylagias on lipitor 20 mg daily, he had done well on  lipitor 10mg  daily.       06/2018 TC 133 TG 207 HDL 36 LDL 56 - 07/2020 TC 161 TG 096 HDL 25 LDL 70 - 07/2021 TC 045 TG 409 HDL 37 LDL 71   -upcoming labs with pcp    5.Left arm numbness - ER visit 09/2021  - CT imaging negative for acute cerebral process - left arm CTA without signifciatn disease - followed up with pcp          SH: often travels to [place]. Recent traveled to [place]. Enjoys classic cars. He was good friends with a patient of mine who recently passed away Dennis [name]. He still remains in touch with his wife Dennis [name] who is also a patient of mine.    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 s/p DES to LAD with DES to RCA, and PTCA to D1 in 06/2007 b. 08/2017: cath showing patent stents with 70% D1, 50% LCx, 60% mid-RCA, and 60% Acute Mrg stenosis. Medical therapy recommended.    GERD (gastroesophageal reflux disease)    Gout    Hypercholesteremia    Hypertension    Myocardial infarction (HCC) 1998     No Known Allergies   Current Outpatient Medications  Medication Sig Dispense Refill   acetaminophen (TYLENOL) 500 MG tablet Take 1,000 mg by mouth every 6 (six) hours as needed for mild pain or headache.      aspirin 81 MG EC tablet Take 81 mg by mouth at bedtime.     atorvastatin (LIPITOR) 10 MG tablet Take 10 mg by mouth at bedtime.   1   Cranberry 500 MG CAPS Take by mouth.  fexofenadine (ALLEGRA) 180 MG tablet Take by mouth.     isosorbide mononitrate (IMDUR) 60 MG 24 hr tablet TAKE 1 TABLET BY MOUTH AT BEDTIME 90 tablet 1   nitroGLYCERIN (NITROSTAT) 0.4 MG SL tablet Place 1 tablet under the tongue every 5 (five) minutes x 3 doses as needed for chest pain. If no relief after 2nd dose, proceed to the ED for an evaluation or call 911     pantoprazole (PROTONIX) 40 MG tablet 40 mg daily.     tamsulosin (FLOMAX) 0.4 MG CAPS capsule Take 0.4 mg by mouth daily.      triamcinolone (NASACORT) 55 MCG/ACT AERO nasal inhaler two sprays by Both Nostrils route daily. PRN     valsartan (DIOVAN) 320 MG tablet Take 320 mg by mouth daily.     verapamil (CALAN-SR) 180 MG CR tablet Take 180 mg by mouth every morning.     vitamin B-12 (CYANOCOBALAMIN) 500 MCG tablet Take 500 mcg by mouth daily.     No current facility-administered medications for this visit.     Past Surgical History:  Procedure Laterality Date   bilateral eye surgery     CARDIAC CATHETERIZATION  2009    PCI with DES to LAD,PTCA to diagonal, and DES to RCA LV gram LVEF 60%    CARDIAC SURGERY     COLONOSCOPY     COLONOSCOPY  05/14/2011   Procedure: COLONOSCOPY;  Surgeon: Corbin Ade, MD;  Location: [facility]ITE;  Service: Endoscopy;  Laterality: N/A;  8:15 AM   LEFT HEART CATH AND CORONARY ANGIOGRAPHY N/A 08/14/2017   Procedure: LEFT HEART CATH AND CORONARY ANGIOGRAPHY;  Surgeon: Swazil[place], Peter M, MD;  Location: [facility] INVASIVE CV LAB;  Service: Cardiovascular;  Laterality: N/A;     No Known Allergies    Family History  Problem Relation Age of Onset   Stroke Mother    Heart attack Father    Colon cancer Neg Hx      Social History Dennis [name] reports that he has never smoked. He has never used smokeless tobacco. Dennis [name] reports current alcohol use of about 5.0 standard drinks of alcohol per week.   Review of Systems CONSTITUTIONAL: No weight loss, fever, chills, weakness or fatigue.  HEENT: Eyes: No visual loss, blurred vision, double vision or yellow sclerae.No hearing loss, sneezing, congestion, runny nose or sore throat.  SKIN: No rash or itching.  CARDIOVASCULAR: per hpi RESPIRATORY: No shortness of breath, cough or sputum.  GASTROINTESTINAL: No anorexia, nausea, vomiting or diarrhea. No abdominal pain or blood.  GENITOURINARY: No burning on urination, no polyuria NEUROLOGICAL: No headache, dizziness, syncope, paralysis, ataxia, numbness or tingling in the extremities. No change in bowel or bladder control.  MUSCULOSKELETAL: No muscle, back pain, joint pain or stiffness.  LYMPHATICS: No enlarged nodes. No history of splenectomy.  PSYCHIATRIC: No history of depression or anxiety.  ENDOCRINOLOGIC: No reports of sweating, cold or heat intolerance. No polyuria or polydipsia.  Marland Kitchen   Physical Examination Today's Vitals   09/05/22 1359  BP: 126/72  Pulse: 81  SpO2: 95%  Weight: 198 lb 12.8 oz (90.2 kg)  Height: 5\' 6"  (1.676 m)   Body mass index is 32.09  kg/m.  Gen: resting comfortably, no acute distress HEENT: no scleral icterus, pupils equal round and reactive, no palptable cervical adenopathy,  CV: RRR, no m/rg, no jvd Resp: Clear to auscultation bilaterally GI: abdomen is soft, non-tender, non-distended, normal bowel sounds, no hepatosplenomegaly MSK: extremities are warm, no edema.  Skin: warm, no rash Neuro:  no focal deficits Psych: appropriate affect   Diagnostic Studies 01/2013 Stress MPI Analysis of the raw perfusion data finds radiotracer uptake in the gut adjacent to the inferior wall.  Tomographic views were obtained using the short axis, vertical long axis, and horizontal long axis planes. There is a small, mild to moderate intensity, inferior perfusion defect that is fixed. No significant reversible defects to indicate ischemia.  Gated imaging reveals an EDV of 70, ESV of 24, normal TID ratio of 0.89, and LVEF of 65% without wall motion abnormality.  IMPRESSION: Low risk exercise Cardiolite. No chest pain reported, there were no diagnostic ST segment changes at maximum work load of 10.1 METS. Perfusion imaging is most consistent with inferior wall attenuation, no definite scar or ischemic defects. LV volumes are normal with LVEF 65% and no focal wall motion abnormalities.   01/2013 Echo Study Conclusions  - Left ventricle: The cavity size was normal. There was mild   concentric hypertrophy. Systolic function was normal. The   estimated ejection fraction was in the range of 60% to   65%. Wall motion was normal; there were no regional wall   motion abnormalities. Features are consistent with a   pseudonormal left ventricular filling pattern, with   concomitant abnormal relaxation and increased filling   pressure (grade 2 diastolic dysfunction). - Mitral valve: Trivial regurgitation. - Left atrium: The atrium was at the upper limits of normal   in size. - Tricuspid valve: Physiologic regurgitation. -  Pulmonary arteries: Systolic pressure could not be   accurately estimated. - Inferior vena cava: Poorly visualized. Unable to estimate   CVP. - Pericardium, extracardiac: There was no pericardial   effusion. Impressions:  - Comparison to prior study March 2009. Mild LVH with LVEF   60-65%, grade 2 diastolic dysfunction. Upper normal left   atrial size. Trivial mitral and tricuspid regurgitation.   Unable to assess PASP or CVP.      08/2015 MPI There was no ST segment deviation noted during stress. Findings consistent with prior inferior myocardial infarction. There is no current ischemia This is a low risk study. The left ventricular ejection fraction is hyperdynamic (>65%). Duke treadmill score of 5, consistent with low risk for major cardiac events   08/2017 cath Previously placed Prox LAD to Mid LAD stent (unknown type) is widely patent. 1st Diag lesion is 70% stenosed. Mid Cx to Dist Cx lesion is 50% stenosed. Previously placed Prox RCA to Mid RCA stent (unknown type) is widely patent. Prox RCA lesion is 40% stenosed. Acute Mrg lesion is 60% stenosed. Mid RCA lesion is 60% stenosed. The left ventricular systolic function is normal. LV end diastolic pressure is normal. The left ventricular ejection fraction is 55-65% by visual estimate.   1. Modest 3 vessel CAD    - 70% first diagonal    - 50% long distal LCx    - 60% mid RCA and RV marginal Dennis [name] 2. Continued excellent patency of stents in the LAD and RCA 3. Normal LV function   Plan: I do not see any critical disease that would explain his resting chest pain. The first diagonal was severely stenosed post stenting of the LAD and this has improved. I would recommend continued medical therapy   06/2020 nuclear stress There was no ST segment deviation noted during stress. No T wave inversion was noted during stress. Findings consistent with prior inferior myocardial infarction. There is no current ischemia This is a low  risk study. The left ventricular ejection fraction is hyperdynamic (>65%).          Assessment and Plan  1. CAD/chest pain -recent chest pains symptoms, unclear etiology - plan for exercise nuclear stress test, hold verapamil day of - 2022 lexiscan had chest pain, dizziness, abdominal pain. Notes indicate resolved in recovery. He is reluctant to reconsider that medciation. If exercise fails may have to consider rescheduling as a dobutamine nuclear stress       Dennis [name], M.D.

## 2022-09-15 ENCOUNTER — Encounter (HOSPITAL_COMMUNITY): Payer: Self-pay

## 2022-09-15 ENCOUNTER — Ambulatory Visit (HOSPITAL_COMMUNITY)
Admission: RE | Admit: 2022-09-15 | Discharge: 2022-09-15 | Disposition: A | Discharge status: Home / Self Care | Payer: Medicare HMO | Source: Ambulatory Visit | Attending: Cardiology | Admitting: Cardiology

## 2022-09-15 ENCOUNTER — Encounter (HOSPITAL_COMMUNITY)
Admission: RE | Admit: 2022-09-15 | Discharge: 2022-09-15 | Disposition: A | Discharge status: Home / Self Care | Payer: Medicare HMO | Source: Ambulatory Visit | Attending: Cardiology | Admitting: Cardiology

## 2022-09-15 DIAGNOSIS — R079 Chest pain, unspecified: Secondary | ICD-10-CM | POA: Diagnosis present

## 2022-09-15 LAB — NM MYOCAR MULTI W/SPECT W/WALL MOTION / EF
Estimated workload: 7
Exercise duration (min): 4 min
Exercise duration (sec): 13 s
LV dias vol: 81 mL (ref 62–150)
LV sys vol: 30 mL
MPHR: 142 {beats}/min
Nuc Stress EF: 63 %
Peak HR: 162 {beats}/min
Percent HR: 109 %
RATE: 0.4
RPE: 14
Rest HR: 63 {beats}/min
Rest Nuclear Isotope Dose: 11 mCi
SDS: 4
SRS: 4
SSS: 8
ST Depression (mm): 0 mm
Stress Nuclear Isotope Dose: 31 mCi
TID: 0.93

## 2022-09-15 MED ORDER — TECHNETIUM TC 99M TETROFOSMIN IV KIT
10.0000 | PACK | Freq: Once | INTRAVENOUS | Status: AC | PRN
  Administered 2022-09-15: 11 via INTRAVENOUS

## 2022-09-15 MED ORDER — TECHNETIUM TC 99M TETROFOSMIN IV KIT
30.0000 | PACK | Freq: Once | INTRAVENOUS | Status: AC | PRN
  Administered 2022-09-15: 31 via INTRAVENOUS

## 2022-09-15 MED ORDER — SODIUM CHLORIDE FLUSH 0.9 % IV SOLN
INTRAVENOUS | Status: AC
  Filled 2022-09-15: qty 10

## 2022-09-15 MED ORDER — REGADENOSON 0.4 MG/5ML IV SOLN
INTRAVENOUS | Status: AC
  Filled 2022-09-15: qty 5

## 2022-10-24 ENCOUNTER — Ambulatory Visit: Payer: Medicare HMO | Attending: Cardiology | Admitting: Cardiology

## 2022-10-24 ENCOUNTER — Encounter: Payer: Self-pay | Admitting: Cardiology

## 2022-10-24 VITALS — BP 110/64 | HR 64 | Ht 66.0 in | Wt 196.0 lb

## 2022-10-24 DIAGNOSIS — I25118 Atherosclerotic heart disease of native coronary artery with other forms of angina pectoris: Secondary | ICD-10-CM

## 2022-10-24 MED ORDER — ISOSORBIDE MONONITRATE ER 60 MG PO TB24: 90.0000 mg | ORAL_TABLET | Freq: Every day | ORAL | 6 refills | Status: DC

## 2022-10-24 NOTE — Patient Instructions (Signed)
Medication Instructions:  Increase Imdur to '90mg'$  daily  Continue all other medications.     Labwork: none  Testing/Procedures: none  Follow-Up: 6 months   Any Other Special Instructions Will Be Listed Below (If Applicable).   If you need a refill on your cardiac medications before your next appointment, please call your pharmacy.

## 2022-10-24 NOTE — Progress Notes (Signed)
Clinical Summary Dennis [name] is a 72 y.o.male seen today for follow up of the following medical problems.    1. CAD   - cath 06/2007 in the setting of unstable angina, PCI with DES to LAD,PTCA to diagonal, and DES to RCA LV gram LVEF 60%   - 01/2013 Stress MPI low risk study, no ischemia - 01/2013 echo LVEF 60-65%, grade II diastolic dysfunction - 08/2015 exercise nuclear stress test: no ischemia      08/2017 cath moderate CAD as reported below, patent stents  06/2020 nuclear stress: no ischemia     - ER visit 09/01/22 with chest pain - trops neg x 2. EKG SR no acute ischemic changes   - reports the day before the ER visit had some chest pain, at rest - aching like midchest, 4-5 beats. Throbbing like pain. No other symptoms right at that time. Rest of the day felt drained and fatigue.  - some lighter pain the following day, fatigue. Mild SOB that day.       09/2022 nuclear stress: prior inferior/inferolaterla infarct, no current ischemia.  -some mild chest pains with high levels of exertion at times     Other medical problems not addressed this visit      2. Dizziness  - orthostatic at prior clinic visit. Symptoms have improved off HCTZ and with increaesd hydration.      3. HTN - he is compliant with meds   4. Hyperlipidemia -  prior mylagias on lipitor 20 mg daily, he had done well on  lipitor 10mg  daily.       06/2018 TC 133 TG 207 HDL 36 LDL 56 - 07/2020 TC 657 TG 846 HDL 25 LDL 70 - 07/2021 TC 962 TG 952 HDL 37 LDL 71   -upcoming labs with pcp    5.Left arm numbness - ER visit 09/2021  - CT imaging negative for acute cerebral process - left arm CTA without signifciatn disease - followed up with pcp          SH: often travels to [place]. Recent traveled to [place]. Enjoys classic cars. He was good friends with a patient of mine who recently passed away Dennis [name]. He still remains in touch with his wife Dennis [name] who is also a patient of  mine.    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 s/p DES to LAD with DES to RCA, and PTCA to D1 in 06/2007 b. 08/2017: cath showing patent stents with 70% D1, 50% LCx, 60% mid-RCA, and 60% Acute Mrg stenosis. Medical therapy recommended.    GERD (gastroesophageal reflux disease)    Gout    Hypercholesteremia    Hypertension    Myocardial infarction (HCC) 1998     No Known Allergies   Current Outpatient Medications  Medication Sig Dispense Refill   acetaminophen (TYLENOL) 500 MG tablet Take 1,000 mg by mouth every 6 (six) hours as needed for mild pain or headache.      aspirin 81 MG EC tablet Take 81 mg by mouth at bedtime.     atorvastatin (LIPITOR) 10 MG tablet Take 10 mg by mouth at bedtime.   1   Cranberry 500 MG CAPS  Take by mouth.     fexofenadine (ALLEGRA) 180 MG tablet Take by mouth.     isosorbide mononitrate (IMDUR) 60 MG 24 hr tablet TAKE 1 TABLET BY MOUTH AT BEDTIME 90 tablet 1   nitroGLYCERIN (NITROSTAT) 0.4 MG SL tablet Place 1 tablet under the tongue every 5 (five) minutes x 3 doses as needed for chest pain. If no relief after 2nd dose, proceed to the ED for an evaluation or call 911     pantoprazole (PROTONIX) 40 MG tablet 40 mg daily.     tamsulosin (FLOMAX) 0.4 MG CAPS capsule Take 0.4 mg by mouth daily.      triamcinolone (NASACORT) 55 MCG/ACT AERO nasal inhaler two sprays by Both Nostrils route daily. PRN     valsartan (DIOVAN) 320 MG tablet Take 320 mg by mouth daily.     verapamil (CALAN-SR) 180 MG CR tablet Take 180 mg by mouth every morning.     vitamin B-12 (CYANOCOBALAMIN) 500 MCG tablet Take 500 mcg by mouth daily.     No current facility-administered medications for this visit.     Past Surgical History:  Procedure Laterality  Date   bilateral eye surgery     CARDIAC CATHETERIZATION  2009   PCI with DES to LAD,PTCA to diagonal, and DES to RCA LV gram LVEF 60%    CARDIAC SURGERY     COLONOSCOPY     COLONOSCOPY  05/14/2011   Procedure: COLONOSCOPY;  Surgeon: Dennis Ade, MD;  [facility];  Service: Endoscopy;  Laterality: N/A;  8:15 AM   LEFT HEART CATH AND CORONARY ANGIOGRAPHY N/A 08/14/2017   Procedure: LEFT HEART CATH AND CORONARY ANGIOGRAPHY;  Surgeon: Swaziland, Peter M, MD;  Location: [facility] INVASIVE CV LAB;  Service: Cardiovascular;  Laterality: N/A;     No Known Allergies    Family History  Problem Relation Age of Onset   Stroke Mother    Heart attack Father    Colon cancer Neg Hx      Social History Dennis [name] reports that he has never smoked. He has never used smokeless tobacco. Dennis [name] reports current alcohol use of about 5.0 standard drinks of alcohol per week.   Review of Systems CONSTITUTIONAL: No weight loss, fever, chills, weakness or fatigue.  HEENT: Eyes: No visual loss, blurred vision, double vision or yellow sclerae.No hearing loss, sneezing, congestion, runny nose or sore throat.  SKIN: No rash or itching.  CARDIOVASCULAR: per hpi RESPIRATORY: No shortness of breath, cough or sputum.  GASTROINTESTINAL: No anorexia, nausea, vomiting or diarrhea. No abdominal pain or blood.  GENITOURINARY: No burning on urination, no polyuria NEUROLOGICAL: No headache, dizziness, syncope, paralysis, ataxia, numbness or tingling in the extremities. No change in bowel or bladder control.  MUSCULOSKELETAL: No muscle, back pain, joint pain or stiffness.  LYMPHATICS: No enlarged nodes. No history of splenectomy.  PSYCHIATRIC: No history of depression or anxiety.  ENDOCRINOLOGIC: No reports of sweating, cold or heat intolerance. No polyuria or polydipsia.  Marland Kitchen   Physical Examination Today's Vitals   10/24/22 0822  BP: 110/64  Pulse: 64  SpO2: 96%  Weight: 196 lb (88.9 kg)  Height:  5\' 6"  (1.676 [name])   Body mass index is 31.64 kg/[name].  Gen: resting comfortably, no acute distress HEENT: no scleral icterus, pupils equal round and reactive, no palptable cervical adenopathy,  CV: RRR, no [name]/rg no jvd Resp: Clear to auscultation bilaterally GI: abdomen is soft, non-tender, non-distended, normal bowel sounds, no hepatosplenomegaly MSK: extremities  are warm, no edema.  Skin: warm, no rash Neuro:  no focal deficits Psych: appropriate affect   Diagnostic Studies 01/2013 Stress MPI Analysis of the raw perfusion data finds radiotracer uptake in the gut adjacent to the inferior wall.  Tomographic views were obtained using the short axis, vertical long axis, and horizontal long axis planes. There is a small, mild to moderate intensity, inferior perfusion defect that is fixed. No significant reversible defects to indicate ischemia.  Gated imaging reveals an EDV of 70, ESV of 24, normal TID ratio of 0.89, and LVEF of 65% without wall motion abnormality.  IMPRESSION: Low risk exercise Cardiolite. No chest pain reported, there were no diagnostic ST segment changes at maximum work load of 10.1 METS. Perfusion imaging is most consistent with inferior wall attenuation, no definite scar or ischemic defects. LV volumes are normal with LVEF 65% and no focal wall motion abnormalities.   01/2013 Echo Study Conclusions  - Left ventricle: The cavity size was normal. There was mild   concentric hypertrophy. Systolic function was normal. The   estimated ejection fraction was in the range of 60% to   65%. Wall motion was normal; there were no regional wall   motion abnormalities. Features are consistent with a   pseudonormal left ventricular filling pattern, with   concomitant abnormal relaxation and increased filling   pressure (grade 2 diastolic dysfunction). - Mitral valve: Trivial regurgitation. - Left atrium: The atrium was at the upper limits of normal   in size. -  Tricuspid valve: Physiologic regurgitation. - Pulmonary arteries: Systolic pressure could not be   accurately estimated. - Inferior vena cava: Poorly visualized. Unable to estimate   CVP. - Pericardium, extracardiac: There was no pericardial   effusion. Impressions:  - Comparison to prior study March 2009. Mild LVH with LVEF   60-65%, grade 2 diastolic dysfunction. Upper normal left   atrial size. Trivial mitral and tricuspid regurgitation.   Unable to assess PASP or CVP.      08/2015 MPI There was no ST segment deviation noted during stress. Findings consistent with prior inferior myocardial infarction. There is no current ischemia This is a low risk study. The left ventricular ejection fraction is hyperdynamic (>65%). Duke treadmill score of 5, consistent with low risk for major cardiac events   08/2017 cath Previously placed Prox LAD to Mid LAD stent (unknown type) is widely patent. 1st Diag lesion is 70% stenosed. Mid Cx to Dist Cx lesion is 50% stenosed. Previously placed Prox RCA to Mid RCA stent (unknown type) is widely patent. Prox RCA lesion is 40% stenosed. Acute Mrg lesion is 60% stenosed. Mid RCA lesion is 60% stenosed. The left ventricular systolic function is normal. LV end diastolic pressure is normal. The left ventricular ejection fraction is 55-65% by visual estimate.   1. Modest 3 vessel CAD    - 70% first diagonal    - 50% long distal LCx    - 60% mid RCA and RV marginal Cheney Ewart 2. Continued excellent patency of stents in the LAD and RCA 3. Normal LV function   Plan: I do not see any critical disease that would explain his resting chest pain. The first diagonal was severely stenosed post stenting of the LAD and this has improved. I would recommend continued medical therapy   06/2020 nuclear stress There was no ST segment deviation noted during stress. No T wave inversion was noted during stress. Findings consistent with prior inferior myocardial  infarction. There is no current  ischemia This is a low risk study. The left ventricular ejection fraction is hyperdynamic (>65%).        09/2022 nuclear stress  Findings are consistent with prior inferior/inferolateral infarction. There is no current ischemia.  The study is low risk.   No ST deviation was noted.   LV perfusion is abnormal.   Left ventricular function is normal. Nuclear stress EF: 63 %. The left ventricular ejection fraction is normal (55-65%). End diastolic cavity size is normal.      Assessment and Plan  1. CAD with other forms of angina -recent chest pains symptoms - stress test was benign without evidence of ischemia - will try increasing imdur to 90mg  daily and monitor symptoms.     F/u 6 months      Antoine Poche, [name].D.

## 2022-12-21 ENCOUNTER — Other Ambulatory Visit: Payer: Self-pay | Admitting: Cardiology

## 2022-12-30 ENCOUNTER — Other Ambulatory Visit: Payer: Self-pay | Admitting: Cardiology

## 2023-04-13 ENCOUNTER — Encounter: Payer: Self-pay | Admitting: Podiatry

## 2023-04-13 ENCOUNTER — Ambulatory Visit: Payer: Medicare HMO | Admitting: Podiatry

## 2023-04-13 ENCOUNTER — Ambulatory Visit (INDEPENDENT_AMBULATORY_CARE_PROVIDER_SITE_OTHER): Payer: Medicare HMO

## 2023-04-13 DIAGNOSIS — M79675 Pain in left toe(s): Secondary | ICD-10-CM

## 2023-04-13 DIAGNOSIS — M7752 Other enthesopathy of left foot: Secondary | ICD-10-CM | POA: Diagnosis not present

## 2023-04-13 MED ORDER — TRIAMCINOLONE ACETONIDE 10 MG/ML IJ SUSP
10.0000 mg | Freq: Once | INTRAMUSCULAR | Status: AC
Start: 2023-04-13 — End: 2023-04-13
  Administered 2023-04-13: 10 mg via INTRA_ARTICULAR

## 2023-04-14 NOTE — Progress Notes (Signed)
 Subjective:   Patient ID: Dennis [name], male   DOB: 73 y.o.   MRN: [id number]   HPI Patient presents stating having a lot of pain in the left forefoot that is been present for several months and does not remember specific injury or other pathology.  Patient does not smoke likes to be active   Review of Systems  All other systems reviewed and are negative.       Objective:  Physical Exam Vitals and nursing note reviewed.  Constitutional:      Appearance: He is well-developed.  Pulmonary:     Effort: Pulmonary effort is normal.  Musculoskeletal:        General: Normal range of motion.  Skin:    General: Skin is warm.  Neurological:     Mental Status: He is alert.     Neurovascular status intact muscle strength adequate range of motion adequate with inflammation fluid noted around the second and then slightly third MPJ left painful when pressed with no digital deformities noted currently.  Good digital perfusion well-oriented     Assessment:  Inflammatory capsulitis of the second and third MPJ left      Plan:  H&P reviewed sterile prep injected periarticular around the joint surfaces 3 mg dexamethasone Kenalog  5 mg Xylocaine  and advised on rigid bottom shoes.  Reappoint to recheck  X-rays were negative for signs of fracture or indications of bony pathology

## 2023-04-23 ENCOUNTER — Encounter: Payer: Self-pay | Admitting: Urology

## 2023-04-23 ENCOUNTER — Ambulatory Visit: Payer: Medicare HMO | Admitting: Urology

## 2023-04-23 ENCOUNTER — Telehealth: Payer: Self-pay

## 2023-04-23 VITALS — BP 143/88 | HR 63

## 2023-04-23 DIAGNOSIS — N390 Urinary tract infection, site not specified: Secondary | ICD-10-CM

## 2023-04-23 DIAGNOSIS — N401 Enlarged prostate with lower urinary tract symptoms: Secondary | ICD-10-CM

## 2023-04-23 DIAGNOSIS — N138 Other obstructive and reflux uropathy: Secondary | ICD-10-CM

## 2023-04-23 DIAGNOSIS — Z87440 Personal history of urinary (tract) infections: Secondary | ICD-10-CM | POA: Diagnosis not present

## 2023-04-23 DIAGNOSIS — R350 Frequency of micturition: Secondary | ICD-10-CM

## 2023-04-23 LAB — URINALYSIS, ROUTINE W REFLEX MICROSCOPIC
Bilirubin, UA: NEGATIVE
Glucose, UA: NEGATIVE
Ketones, UA: NEGATIVE
Leukocytes,UA: NEGATIVE
Nitrite, UA: NEGATIVE
Protein,UA: NEGATIVE
RBC, UA: NEGATIVE
Specific Gravity, UA: 1.02 (ref 1.005–1.030)
Urobilinogen, Ur: 0.2 mg/dL (ref 0.2–1.0)
pH, UA: 5.5 (ref 5.0–7.5)

## 2023-04-23 NOTE — Telephone Encounter (Signed)
Called Pt PCP tget labs faxed over per MD

## 2023-04-23 NOTE — Telephone Encounter (Signed)
-----   Message from Bjorn Pippin sent at 04/23/2023  1:19 PM EST ----- Regarding: PSA resutl I need his last 2-3 PSA's from Dr. Ouida Sills.

## 2023-04-23 NOTE — Progress Notes (Signed)
Subjective: 1. Recurrent UTI   2. BPH with urinary obstruction   3. Urinary frequency      Consult requested by Dr. Carylon Perches  04/23/23: Dennis [name] is a 73 yo male who is sent by Dr. Ouida Sills for recurrent UTI's with e. Coli in 4/24 and 9/24.  He had a renal US and abdominal MRI in 2022 for a possible left renal mass but no mass was seen on MRI.  He has a history of BPH with BOO and has been on tamsulosin.  His PVR on the [place] in 2022 was only 70ml.  He is a previous patient of Dr. Vernie [name] who was last seen in 2018.  He had a transient PSA elevation to 4.73 in 2011 but the last was 2.65 in 2012 that I can see. Cr was 0.84 in 5/24.  He is voding ok with an IPSS of 5 and nocturia x 1.  He has some hematuria with the last UTI.   He had another UTI several years ago.  UA today has trace blood.  PVR is 70ml.  His PSA was 4.3 in 4/24.  ROS:  Review of Systems  HENT:  Positive for sinus pain.   Genitourinary:  Positive for frequency and urgency.  All other systems reviewed and are negative.   No Known Allergies  Past Medical History:  Diagnosis Date   Coronary artery disease    a. s/p DES to LAD with DES to RCA, and PTCA to D1 in 06/2007 b. 08/2017: cath showing patent stents with 70% D1, 50% LCx, 60% mid-RCA, and 60% Acute Mrg stenosis. Medical therapy recommended.    GERD (gastroesophageal reflux disease)    Gout    Hypercholesteremia    Hypertension    Myocardial infarction (HCC) 1998    Past Surgical History:  Procedure Laterality Date   bilateral eye surgery     CARDIAC CATHETERIZATION  2009   PCI with DES to LAD,PTCA to diagonal, and DES to RCA LV gram LVEF 60%    CARDIAC SURGERY     COLONOSCOPY     COLONOSCOPY  05/14/2011   Procedure: COLONOSCOPY;  Surgeon: Corbin Ade, MD;  [facility];  Service: Endoscopy;  Laterality: N/A;  8:15 AM   LEFT HEART CATH AND CORONARY ANGIOGRAPHY N/A 08/14/2017   Procedure: LEFT HEART CATH AND CORONARY ANGIOGRAPHY;  Surgeon: Swaziland, Peter M,  MD;  Location: [facility] INVASIVE CV LAB;  Service: Cardiovascular;  Laterality: N/A;    Social History   Socioeconomic History   Marital status: Married    Spouse name: Not on file   Number of children: Not on file   Years of education: Not on file   Highest education level: Not on file  Occupational History   Not on file  Tobacco Use   Smoking status: Never   Smokeless tobacco: Never  Vaping Use   Vaping status: Never Used  Substance and Sexual Activity   Alcohol use: Yes    Alcohol/week: 5.0 standard drinks of alcohol    Types: 5 Cans of beer per week    Comment: occ   Drug use: No   Sexual activity: Yes  Other Topics Concern   Not on file  Social History Narrative   Not on file   Social Drivers of Health   Financial Resource Strain: Not on file  Food Insecurity: Not on file  Transportation Needs: Not on file  Physical Activity: Not on file  Stress: Not on file  Social Connections:  Unknown (09/24/2021)   Received from [facility], [facility]   Social Network    Social Network: Not on file  Intimate Partner Violence: Unknown (09/24/2021)   Received from [facility], [facility]   HITS    Physically Hurt: Not on file    Insult or Talk Down To: Not on file    Threaten Physical Harm: Not on file    Scream or Curse: Not on file    Family History  Problem Relation Age of Onset   Stroke Mother    Heart attack Father    Colon cancer Neg Hx     Anti-infectives: Anti-infectives (From admission, onward)    None       Current Outpatient Medications  Medication Sig Dispense Refill   acetaminophen (TYLENOL) 500 MG tablet Take 1,000 mg by mouth every 6 (six) hours as needed for mild pain or headache.      aspirin 81 MG EC tablet Take 81 mg by mouth at bedtime.     atorvastatin (LIPITOR) 10 MG tablet Take 10 mg by mouth at bedtime.   1   Cranberry 500 MG CAPS Take by mouth.     fexofenadine (ALLEGRA) 180 MG tablet Take by mouth.     isosorbide mononitrate  (IMDUR) 60 MG 24 hr tablet Take 1.5 tablets (90 mg total) by mouth daily. 45 tablet 6   nitroGLYCERIN (NITROSTAT) 0.4 MG SL tablet Place 1 tablet under the tongue every 5 (five) minutes x 3 doses as needed for chest pain. If no relief after 2nd dose, proceed to the ED for an evaluation or call 911     pantoprazole (PROTONIX) 40 MG tablet 40 mg daily.     tamsulosin (FLOMAX) 0.4 MG CAPS capsule Take 0.4 mg by mouth daily.      triamcinolone (NASACORT) 55 MCG/ACT AERO nasal inhaler two sprays by Both Nostrils route daily. PRN     valsartan (DIOVAN) 320 MG tablet Take 320 mg by mouth daily.     verapamil (CALAN-SR) 180 MG CR tablet Take 180 mg by mouth every morning.     vitamin B-12 (CYANOCOBALAMIN) 500 MCG tablet Take 500 mcg by mouth daily.     No current facility-administered medications for this visit.     Objective: Vital signs in last 24 hours: BP (!) 143/88   Pulse 63   Intake/Output from previous day: No intake/output data recorded. Intake/Output this shift: @IOTHISSHIFT @   Physical Exam Vitals reviewed.  Constitutional:      Appearance: Normal appearance.  Cardiovascular:     Rate and Rhythm: Normal rate and regular rhythm.     Heart sounds: Normal heart sounds.  Pulmonary:     Effort: Pulmonary effort is normal. No respiratory distress.     Breath sounds: Normal breath sounds.  Abdominal:     Palpations: Abdomen is soft.     Hernia: No hernia is present.  Genitourinary:    Comments: Nl phallus with adequate meatus Scrotum, testes and epididymis normal AP without lesions. NST without mass. Prostate 2+ benign SV non-palpable.  Neurological:     Mental Status: He is alert.     Lab Results:  Results for orders placed or performed in visit on 04/23/23 (from the past 24 hours)  Urinalysis, Routine w reflex microscopic     Status: None   Collection Time: 04/23/23  1:16 PM  Result Value Ref Range   Specific Gravity, UA 1.020 1.005 - 1.030   pH, UA 5.5 5.0 - 7.5  Color, UA Yellow Yellow   Appearance Ur Clear Clear   Leukocytes,UA Negative Negative   Protein,UA Negative Negative/Trace   Glucose, UA Negative Negative   Ketones, UA Negative Negative   RBC, UA Negative Negative   Bilirubin, UA Negative Negative   Urobilinogen, Ur 0.2 0.2 - 1.0 mg/dL   Nitrite, UA Negative Negative   Microscopic Examination Comment    Narrative   Performed at:  [address] [facility] [address], [place], [place]  [id number] Lab Director: Chinita Pester MT, Phone:  [phone]    BMET No results for input(s): "NA", "K", "CL", "CO2", "GLUCOSE", "BUN", "CREATININE", "CALCIUM" in the last 72 hours. PT/INR No results for input(s): "LABPROT", "INR" in the last 72 hours. ABG No results for input(s): "PHART", "HCO3" in the last 72 hours.  Invalid input(s): "PCO2", "PO2" UA reviewed along with prior labs and PSAs Studies/Results: No results found. Prior office notes from Dr. Vernie [name] and Dr. Alonza Smoker recent notes reviewed. EPIC notes reviewed.  MRI and renal US notes reviewed.   Assessment/Plan: Recurrent UTI's.   He is clear today but with the history I will have him return for cystoscopy.  His kidneys were ok in 2022 so I don't think he needs repeat imaging.    BPH with BOO.  He is doing well with mild symptoms and a low PVR on tamsulosin.  I will get records from Dr. Ouida Sills for his PSA's.   No orders of the defined types were placed in this encounter.    Orders Placed This Encounter  Procedures   Urinalysis, Routine w reflex microscopic   Bladder scan     Return for Next available cystoscopy .    CC: Dr. Carylon Perches.      Bjorn Pippin 04/24/2023

## 2023-04-30 ENCOUNTER — Encounter: Payer: Self-pay | Admitting: Cardiology

## 2023-04-30 ENCOUNTER — Ambulatory Visit: Payer: Medicare HMO | Admitting: Urology

## 2023-04-30 ENCOUNTER — Ambulatory Visit: Payer: Medicare HMO | Attending: Cardiology | Admitting: Cardiology

## 2023-04-30 VITALS — BP 130/68 | HR 67 | Ht 66.0 in | Wt 198.2 lb

## 2023-04-30 DIAGNOSIS — E782 Mixed hyperlipidemia: Secondary | ICD-10-CM

## 2023-04-30 DIAGNOSIS — I10 Essential (primary) hypertension: Secondary | ICD-10-CM

## 2023-04-30 DIAGNOSIS — I25118 Atherosclerotic heart disease of native coronary artery with other forms of angina pectoris: Secondary | ICD-10-CM | POA: Diagnosis not present

## 2023-04-30 NOTE — Progress Notes (Signed)
Clinical Summary Mr. Peltz is a 73 y.o.male seen today for follow up of the following medical problems.   1. CAD   - cath 06/2007 in the setting of unstable angina, PCI with DES to LAD,PTCA to diagonal, and DES to RCA LV gram LVEF 60%   - 01/2013 Stress MPI low risk study, no ischemia - 01/2013 echo LVEF 60-65%, grade II diastolic dysfunction - 08/2015 exercise nuclear stress test: no ischemia      08/2017 cath moderate CAD as reported below, patent stents  06/2020 nuclear stress: no ischemia  - ER visit 09/01/22 with chest pain - trops neg x 2. EKG SR no acute ischemic changes   09/2022 nuclear stress: prior inferior/inferolaterla infarct, no current ischemia.      -last visit we increased imdur to 90mg  daily due to ongoing chest pain  - no chest pains, no SOB/DOE - compliant with meds     2. Dizziness  - orthostatic at prior clinic visit. Symptoms have improved off HCTZ and with increaesd hydration.      3. HTN - he is compliant with meds   4. Hyperlipidemia -  prior mylagias on lipitor 20 mg daily, he had done well on  lipitor 10mg  daily.       06/2018 TC 133 TG 207 HDL 36 LDL 56 - 07/2020 TC 630 TG 160 HDL 25 LDL 70 - 07/2021 TC 109 TG 323 HDL 37 LDL 71 07/2022 TC 136 TG 171 HDL 37 LDL 70 - due for repeat labs in April with pcp           SH: often travels to [place]. Recent traveled to [place]. Enjoys classic cars. He was good friends with a patient of mine who recently passed away [name]. He still remains in touch with his wife [name] who is also a patient of mine.    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 s/p DES to LAD with DES to RCA, and PTCA to D1 in 06/2007 b. 08/2017: cath showing patent stents with 70% D1,  50% LCx, 60% mid-RCA, and 60% Acute Mrg stenosis. Medical therapy recommended.    GERD (gastroesophageal reflux disease)    Gout    Hypercholesteremia    Hypertension    Myocardial infarction (HCC) 1998     No Known Allergies   Current Outpatient Medications  Medication Sig Dispense Refill   acetaminophen (TYLENOL) 500 MG tablet Take 1,000 mg by mouth every 6 (six) hours as needed for mild pain or headache.      aspirin 81 MG EC tablet Take 81 mg by mouth at bedtime.     atorvastatin (LIPITOR) 10 MG tablet Take 10 mg by mouth at bedtime.   1   Cranberry 500 MG CAPS Take by mouth.     fexofenadine (ALLEGRA) 180 MG tablet Take by mouth.     isosorbide mononitrate (IMDUR) 60 MG 24 hr tablet Take 1.5 tablets (90 mg total) by mouth daily. 45 tablet 6   nitroGLYCERIN (NITROSTAT) 0.4 MG SL tablet Place 1 tablet under the tongue every 5 (five) minutes x 3 doses as needed for chest pain. If no relief after 2nd dose, proceed to the ED for  an evaluation or call 911     pantoprazole (PROTONIX) 40 MG tablet 40 mg daily.     tamsulosin (FLOMAX) 0.4 MG CAPS capsule Take 0.4 mg by mouth daily.      triamcinolone (NASACORT) 55 MCG/ACT AERO nasal inhaler two sprays by Both Nostrils route daily. PRN     valsartan (DIOVAN) 320 MG tablet Take 320 mg by mouth daily.     verapamil (CALAN-SR) 180 MG CR tablet Take 180 mg by mouth every morning.     vitamin B-12 (CYANOCOBALAMIN) 500 MCG tablet Take 500 mcg by mouth daily.     No current facility-administered medications for this visit.     Past Surgical History:  Procedure Laterality Date   bilateral eye surgery     CARDIAC CATHETERIZATION  2009   PCI with DES to LAD,PTCA to diagonal, and DES to RCA LV gram LVEF 60%    CARDIAC SURGERY     COLONOSCOPY     COLONOSCOPY  05/14/2011   Procedure: COLONOSCOPY;  Surgeon: Corbin Ade, MD;  [facility];  Service: Endoscopy;  Laterality: N/A;  8:15 AM   LEFT HEART CATH AND CORONARY ANGIOGRAPHY N/A  08/14/2017   Procedure: LEFT HEART CATH AND CORONARY ANGIOGRAPHY;  Surgeon: Swaziland, Peter M, MD;  Location: [facility] INVASIVE CV LAB;  Service: Cardiovascular;  Laterality: N/A;     No Known Allergies    Family History  Problem Relation Age of Onset   Stroke Mother    Heart attack Father    Colon cancer Neg Hx      Social History Mr. Yeakey reports that he has never smoked. He has never used smokeless tobacco. Mr. Rossberg reports current alcohol use of about 5.0 standard drinks of alcohol per week.      Physical Examination Today's Vitals   04/30/23 1017  BP: 130/68  Pulse: 67  SpO2: 96%  Weight: 198 lb 3.2 oz (89.9 kg)  Height: 5\' 6"  (1.676 m)   Body mass index is 31.99 kg/m.  Gen: resting comfortably, no acute distress HEENT: no scleral icterus, pupils equal round and reactive, no palptable cervical adenopathy,  CV: RRR, no mrg, no jvd Resp: Clear to auscultation bilaterally GI: abdomen is soft, non-tender, non-distended, normal bowel sounds, no hepatosplenomegaly MSK: extremities are warm, no edema.  Skin: warm, no rash Neuro:  no focal deficits Psych: appropriate affect   Diagnostic Studies  01/2013 Stress MPI Analysis of the raw perfusion data finds radiotracer uptake in the gut adjacent to the inferior wall.  Tomographic views were obtained using the short axis, vertical long axis, and horizontal long axis planes. There is a small, mild to moderate intensity, inferior perfusion defect that is fixed. No significant reversible defects to indicate ischemia.  Gated imaging reveals an EDV of 70, ESV of 24, normal TID ratio of 0.89, and LVEF of 65% without wall motion abnormality.  IMPRESSION: Low risk exercise Cardiolite. No chest pain reported, there were no diagnostic ST segment changes at maximum work load of 10.1 METS. Perfusion imaging is most consistent with inferior wall attenuation, no definite scar or ischemic defects. LV volumes are normal  with LVEF 65% and no focal wall motion abnormalities.   01/2013 Echo Study Conclusions  - Left ventricle: The cavity size was normal. There was mild   concentric hypertrophy. Systolic function was normal. The   estimated ejection fraction was in the range of 60% to   65%. Wall motion was normal; there were no regional wall  motion abnormalities. Features are consistent with a   pseudonormal left ventricular filling pattern, with   concomitant abnormal relaxation and increased filling   pressure (grade 2 diastolic dysfunction). - Mitral valve: Trivial regurgitation. - Left atrium: The atrium was at the upper limits of normal   in size. - Tricuspid valve: Physiologic regurgitation. - Pulmonary arteries: Systolic pressure could not be   accurately estimated. - Inferior vena cava: Poorly visualized. Unable to estimate   CVP. - Pericardium, extracardiac: There was no pericardial   effusion. Impressions:  - Comparison to prior study March 2009. Mild LVH with LVEF   60-65%, grade 2 diastolic dysfunction. Upper normal left   atrial size. Trivial mitral and tricuspid regurgitation.   Unable to assess PASP or CVP.      08/2015 MPI There was no ST segment deviation noted during stress. Findings consistent with prior inferior myocardial infarction. There is no current ischemia This is a low risk study. The left ventricular ejection fraction is hyperdynamic (>65%). Duke treadmill score of 5, consistent with low risk for major cardiac events   08/2017 cath Previously placed Prox LAD to Mid LAD stent (unknown type) is widely patent. 1st Diag lesion is 70% stenosed. Mid Cx to Dist Cx lesion is 50% stenosed. Previously placed Prox RCA to Mid RCA stent (unknown type) is widely patent. Prox RCA lesion is 40% stenosed. Acute Mrg lesion is 60% stenosed. Mid RCA lesion is 60% stenosed. The left ventricular systolic function is normal. LV end diastolic pressure is normal. The left  ventricular ejection fraction is 55-65% by visual estimate.   1. Modest 3 vessel CAD    - 70% first diagonal    - 50% long distal LCx    - 60% mid RCA and RV marginal Kaislyn Gulas 2. Continued excellent patency of stents in the LAD and RCA 3. Normal LV function   Plan: I do not see any critical disease that would explain his resting chest pain. The first diagonal was severely stenosed post stenting of the LAD and this has improved. I would recommend continued medical therapy   06/2020 nuclear stress There was no ST segment deviation noted during stress. No T wave inversion was noted during stress. Findings consistent with prior inferior myocardial infarction. There is no current ischemia This is a low risk study. The left ventricular ejection fraction is hyperdynamic (>65%).        09/2022 nuclear stress  Findings are consistent with prior inferior/inferolateral infarction. There is no current ischemia.  The study is low risk.   No ST deviation was noted.   LV perfusion is abnormal.   Left ventricular function is normal. Nuclear stress EF: 63 %. The left ventricular ejection fraction is normal (55-65%). End diastolic cavity size is normal.   Assessment and Plan   1. CAD with other forms of angina -prior chest pain has resolved with increase in imdur last visit - stress test 09/2022 no ischemia - continue current meds  2. HTN - at goal, continue current meds  3. HLD - LDL reasonable given medication limitations, continue current therapy   F/u 6 months   Antoine Poche, M.D.

## 2023-04-30 NOTE — Patient Instructions (Signed)
Medication Instructions:  Continue all current medications.   Labwork: none  Testing/Procedures: none  Follow-Up: 6 months   Any Other Special Instructions Will Be Listed Below (If Applicable).   If you need a refill on your cardiac medications before your next appointment, please call your pharmacy.  

## 2023-06-11 ENCOUNTER — Encounter: Payer: Self-pay | Admitting: Urology

## 2023-06-11 ENCOUNTER — Ambulatory Visit: Payer: Medicare HMO | Admitting: Urology

## 2023-06-11 VITALS — BP 156/77 | HR 61

## 2023-06-11 DIAGNOSIS — N390 Urinary tract infection, site not specified: Secondary | ICD-10-CM

## 2023-06-11 DIAGNOSIS — Z87440 Personal history of urinary (tract) infections: Secondary | ICD-10-CM

## 2023-06-11 DIAGNOSIS — Z09 Encounter for follow-up examination after completed treatment for conditions other than malignant neoplasm: Secondary | ICD-10-CM

## 2023-06-11 MED ORDER — CIPROFLOXACIN HCL 500 MG PO TABS
500.0000 mg | ORAL_TABLET | Freq: Once | ORAL | Status: AC
Start: 2023-06-11 — End: 2023-06-11
  Administered 2023-06-11: 500 mg via ORAL

## 2023-06-11 NOTE — Progress Notes (Signed)
 Subjective: 1. Recurrent UTI      Consult requested by Dr. Carylon Perches  3/6/25[name] returns today for possible cystoscopy for recurrent UTI's.   He has not had a UTI since 9/24 and his UA is clear today.   He is voiding well on tamsulosin.   04/23/23: Dennis [name] is a 73 yo male who is sent by Dr. Ouida Sills for recurrent UTI's with e. Coli in 4/24 and 9/24.  He had a renal US and abdominal MRI in 2022 for a possible left renal mass but no mass was seen on MRI.  He has a history of BPH with BOO and has been on tamsulosin.  His PVR on the [place] in 2022 was only 70ml.  He is a previous patient of Dr. Vernie Ammons who was last seen in 2018.  He had a transient PSA elevation to 4.73 in 2011 but the last was 2.65 in 2012 that I can see. Cr was 0.84 in 5/24.  He is voding ok with an IPSS of 5 and nocturia x 1.  He has some hematuria with the last UTI.   He had another UTI several years ago.  UA today has trace blood.  PVR is 70ml.  His PSA was 4.3 in 4/24.  ROS:  Review of Systems  HENT:  Positive for sinus pain.   Genitourinary:  Positive for frequency and urgency.  All other systems reviewed and are negative.   No Known Allergies  Past Medical History:  Diagnosis Date   Coronary artery disease    a. s/p DES to LAD with DES to RCA, and PTCA to D1 in 06/2007 b. 08/2017: cath showing patent stents with 70% D1, 50% LCx, 60% mid-RCA, and 60% Acute Mrg stenosis. Medical therapy recommended.    GERD (gastroesophageal reflux disease)    Gout    Hypercholesteremia    Hypertension    Myocardial infarction (HCC) 1998    Past Surgical History:  Procedure Laterality Date   bilateral eye surgery     CARDIAC CATHETERIZATION  2009   PCI with DES to LAD,PTCA to diagonal, and DES to RCA LV gram LVEF 60%    CARDIAC SURGERY     COLONOSCOPY     COLONOSCOPY  05/14/2011   Procedure: COLONOSCOPY;  Surgeon: Corbin Ade, MD;  [facility];  Service: Endoscopy;  Laterality: N/A;  8:15 AM   LEFT HEART CATH AND  CORONARY ANGIOGRAPHY N/A 08/14/2017   Procedure: LEFT HEART CATH AND CORONARY ANGIOGRAPHY;  Surgeon: Swaziland, Peter M, MD;  Location: [facility] INVASIVE CV LAB;  Service: Cardiovascular;  Laterality: N/A;    Social History   Socioeconomic History   Marital status: Married    Spouse name: Not on file   Number of children: Not on file   Years of education: Not on file   Highest education level: Not on file  Occupational History   Not on file  Tobacco Use   Smoking status: Never   Smokeless tobacco: Never  Vaping Use   Vaping status: Never Used  Substance and Sexual Activity   Alcohol use: Yes    Alcohol/week: 5.0 standard drinks of alcohol    Types: 5 Cans of beer per week    Comment: occ   Drug use: No   Sexual activity: Yes  Other Topics Concern   Not on file  Social History Narrative   Not on file   Social Drivers of Health   Financial Resource Strain: Not on file  Food  Insecurity: Not on file  Transportation Needs: Not on file  Physical Activity: Not on file  Stress: Not on file  Social Connections: Unknown (09/24/2021)   [facility]   Social Network    Social Network: Not on file  Intimate Partner Violence: Unknown (09/24/2021)   Received from [facility], [facility]   HITS    Physically Hurt: Not on file    Insult or Talk Down To: Not on file    Threaten Physical Harm: Not on file    Scream or Curse: Not on file    Family History  Problem Relation Age of Onset   Stroke Mother    Heart attack Father    Colon cancer Neg Hx     Anti-infectives: Anti-infectives (From admission, onward)    Start     Dose/Rate Route Frequency Ordered Stop   06/11/23 1400  CIPROFLOXACIN HCL 500 MG PO TABS        500 mg Oral  Once 06/11/23 1359 06/11/23 1410       Current Outpatient Medications  Medication Sig Dispense Refill   acetaminophen (TYLENOL) 500 MG tablet Take 1,000 mg by mouth every 6 (six) hours as needed for mild pain or headache.       aspirin 81 MG EC tablet Take 81 mg by mouth at bedtime.     atorvastatin (LIPITOR) 10 MG tablet Take 10 mg by mouth at bedtime.   1   cetirizine (ZYRTEC) 10 MG tablet Take 10 mg by mouth at bedtime.     Cranberry 500 MG CAPS Take by mouth.     isosorbide mononitrate (IMDUR) 60 MG 24 hr tablet Take 1.5 tablets (90 mg total) by mouth daily. 45 tablet 6   nitroGLYCERIN (NITROSTAT) 0.4 MG SL tablet Place 1 tablet under the tongue every 5 (five) minutes x 3 doses as needed for chest pain. If no relief after 2nd dose, proceed to the ED for an evaluation or call 911     pantoprazole (PROTONIX) 40 MG tablet 40 mg daily.     tamsulosin (FLOMAX) 0.4 MG CAPS capsule Take 0.4 mg by mouth daily.      triamcinolone (NASACORT) 55 MCG/ACT AERO nasal inhaler two sprays by Both Nostrils route daily. PRN     valsartan (DIOVAN) 320 MG tablet Take 320 mg by mouth daily.     verapamil (CALAN-SR) 180 MG CR tablet Take 180 mg by mouth every morning.     vitamin B-12 (CYANOCOBALAMIN) 500 MCG tablet Take 500 mcg by mouth daily.     No current facility-administered medications for this visit.     Objective: Vital signs in last 24 hours: BP (!) 156/77   Pulse 61   Intake/Output from previous day: No intake/output data recorded. Intake/Output this shift: @IOTHISSHIFT @   Physical Exam Vitals reviewed.  Constitutional:      Appearance: Normal appearance.  Neurological:     Mental Status: He is alert.     Lab Results:  Results for orders placed or performed in visit on 06/11/23 (from the past 24 hours)  Urinalysis, Routine w reflex microscopic     Status: None   Collection Time: 06/11/23  2:26 PM  Result Value Ref Range   Specific Gravity, UA 1.025 1.005 - 1.030   pH, UA 6.0 5.0 - 7.5   Color, UA Yellow Yellow   Appearance Ur Clear Clear   Leukocytes,UA Negative Negative   Protein,UA Negative Negative/Trace   Glucose, UA Negative Negative  Ketones, UA Negative Negative   RBC, UA Negative  Negative   Bilirubin, UA Negative Negative   Urobilinogen, Ur 1.0 0.2 - 1.0 mg/dL   Nitrite, UA Negative Negative   Microscopic Examination Comment    Narrative   Performed at:  [address] - [facility]  [address] [place], [place]  [id number] Lab Director: Chinita Pester MT, Phone:  [phone]     BMET No results for input(s): "NA", "K", "CL", "CO2", "GLUCOSE", "BUN", "CREATININE", "CALCIUM" in the last 72 hours. PT/INR No results for input(s): "LABPROT", "INR" in the last 72 hours. ABG No results for input(s): "PHART", "HCO3" in the last 72 hours.  Invalid input(s): "PCO2", "PO2" UA is clear.  Studies/Results: No results found. Procedure: cystoscopy.  He was prepped with betadine and lidocaine jelly.  Cipro was given after the procedure.  The scope passed easily.  The urethra was normal. The prostate was 3-4cm with bilobar hyperplasia and some friable prostatic polyps.  The bladder wall had moderate trabeculation with cellules on the dome and no mucosal lesions.  UO's are normal.   Assessment/Plan: Recurrent UTI's.   HE has some inflammatory polyps in the prostate but no other abnormaltiies. F/u in 6 months.    BPH with BOO.  He is doing well with mild symptoms and a low PVR on tamsulosin.    Meds ordered this encounter  Medications   ciprofloxacin (CIPRO) tablet 500 mg     Orders Placed This Encounter  Procedures   Urinalysis, Routine w reflex microscopic   Cystoscopy     Return in about 6 months (around 12/12/2023) for with any provider. .    CC: Dr. Carylon Perches.      Bjorn Pippin 06/12/2023

## 2023-06-12 ENCOUNTER — Ambulatory Visit: Payer: Medicare HMO | Admitting: Podiatry

## 2023-06-12 ENCOUNTER — Encounter: Payer: Self-pay | Admitting: Podiatry

## 2023-06-12 DIAGNOSIS — L600 Ingrowing nail: Secondary | ICD-10-CM

## 2023-06-12 LAB — URINALYSIS, ROUTINE W REFLEX MICROSCOPIC
Bilirubin, UA: NEGATIVE
Glucose, UA: NEGATIVE
Ketones, UA: NEGATIVE
Leukocytes,UA: NEGATIVE
Nitrite, UA: NEGATIVE
Protein,UA: NEGATIVE
RBC, UA: NEGATIVE
Specific Gravity, UA: 1.025 (ref 1.005–1.030)
Urobilinogen, Ur: 1 mg/dL (ref 0.2–1.0)
pH, UA: 6 (ref 5.0–7.5)

## 2023-06-12 NOTE — Patient Instructions (Signed)

## 2023-06-16 NOTE — Progress Notes (Signed)
 Subjective:   Patient ID: Dennis [name], male   DOB: 73 y.o.   MRN: [id number]   HPI Patient states the end of his second toe left has really been bothering him and he is not sure what is causing it.  Patient states it is the side towards his big toe   ROS      Objective:  Physical Exam  Neuro vascular status intact with incurvated left second nail medial border with slight redness no active drainage noted     Assessment:  In her own toenail deformity left hallux medial border     Plan:  H&P reviewed and I have recommended trying to remove the corner and hopefully this will solve the problem.  I explained the procedure and patient signed consent form and I infiltrated the left second toe 60 mg Xylocaine Marcaine mixture and removed the medial border exposed matrix applied phenol 3 applications 30 seconds followed by alcohol lavage sterile dressing gave instructions on soaks and wear dressing 24 hours taken off earlier if throbbing were to occur and will be seen back if any issues occur

## 2023-07-06 ENCOUNTER — Other Ambulatory Visit: Payer: Self-pay | Admitting: Cardiology

## 2023-07-29 ENCOUNTER — Encounter: Payer: Self-pay | Admitting: Internal Medicine

## 2023-08-29 ENCOUNTER — Other Ambulatory Visit: Payer: Self-pay | Admitting: Cardiology

## 2023-12-14 ENCOUNTER — Ambulatory Visit: Admitting: Urology

## 2023-12-14 VITALS — BP 135/76 | HR 69

## 2023-12-14 DIAGNOSIS — N401 Enlarged prostate with lower urinary tract symptoms: Secondary | ICD-10-CM

## 2023-12-14 DIAGNOSIS — N138 Other obstructive and reflux uropathy: Secondary | ICD-10-CM

## 2023-12-14 DIAGNOSIS — R350 Frequency of micturition: Secondary | ICD-10-CM

## 2023-12-14 DIAGNOSIS — Z87440 Personal history of urinary (tract) infections: Secondary | ICD-10-CM | POA: Diagnosis not present

## 2023-12-14 DIAGNOSIS — N390 Urinary tract infection, site not specified: Secondary | ICD-10-CM | POA: Diagnosis not present

## 2023-12-14 LAB — URINALYSIS, ROUTINE W REFLEX MICROSCOPIC
Bilirubin, UA: NEGATIVE
Ketones, UA: NEGATIVE
Leukocytes,UA: NEGATIVE
Nitrite, UA: NEGATIVE
Protein,UA: NEGATIVE
RBC, UA: NEGATIVE
Specific Gravity, UA: <1.005 — ABNORMAL LOW (ref 1.005–1.030)
Urobilinogen, Ur: 0.2 mg/dL (ref 0.2–1.0)
pH, UA: 6 (ref 5.0–7.5)

## 2023-12-14 MED ORDER — TAMSULOSIN HCL 0.4 MG PO CAPS: 0.4000 mg | ORAL_CAPSULE | Freq: Every day | ORAL | 3 refills | Status: AC

## 2023-12-14 MED ORDER — NITROFURANTOIN MONOHYD MACRO 100 MG PO CAPS: 100.0000 mg | ORAL_CAPSULE | Freq: Two times a day (BID) | ORAL | 3 refills | Status: DC

## 2023-12-14 NOTE — Progress Notes (Unsigned)
 12/14/2023 3:25 PM   RICKEY SADOWSKI 04-15-1950 [id number]  Referring provider: Sheryle Carwin, MD [address] [place],  [place] [address]  No chief complaint on file.   HPI: No UTi since last visit. Nocturia 0-1x. IPSS 6 QOl 1 on flomax  0.4mg  daily. No dysuria or hematuria. Uirne stream strong. No straining to urinate.    PMH: Past Medical History:  Diagnosis Date   Coronary artery disease    a. s/p DES to LAD with DES to RCA, and PTCA to D1 in 06/2007 b. 08/2017: cath showing patent stents with 70% D1, 50% LCx, 60% mid-RCA, and 60% Acute Mrg stenosis. Medical therapy recommended.    GERD (gastroesophageal reflux disease)    Gout    Hypercholesteremia    Hypertension    Myocardial infarction [facility]) 1998    Surgical History: Past Surgical History:  Procedure Laterality Date   bilateral eye surgery     CARDIAC CATHETERIZATION  2009   PCI with DES to LAD,PTCA to diagonal, and DES to RCA LV gram LVEF 60%    CARDIAC SURGERY     COLONOSCOPY     COLONOSCOPY  05/14/2011   Procedure: COLONOSCOPY;  Surgeon: Lamar CHRISTELLA Hollingshead, MD;  [facility];  Service: Endoscopy;  Laterality: N/A;  8:15 AM   LEFT HEART CATH AND CORONARY ANGIOGRAPHY N/A 08/14/2017   Procedure: LEFT HEART CATH AND CORONARY ANGIOGRAPHY;  Surgeon: Swaziland, Peter M, MD;  Location: [facility] INVASIVE CV LAB;  Service: Cardiovascular;  Laterality: N/A;    Home Medications:  Allergies as of 12/14/2023   No Known Allergies      Medication List        Accurate as of December 14, 2023  3:25 PM. If you have any questions, ask your nurse or doctor.          acetaminophen  500 MG tablet Commonly known as: TYLENOL  Take 1,000 mg by mouth every 6 (six) hours as needed for mild pain or headache.   aspirin  EC 81 MG tablet Take 81 mg by mouth at bedtime.   atorvastatin  10 MG tablet Commonly known as: LIPITOR Take 10 mg by mouth at bedtime.   cetirizine 10 MG tablet Commonly known as: ZYRTEC Take 10 mg  by mouth at bedtime.   Cranberry 500 MG Caps Take by mouth.   cyanocobalamin 500 MCG tablet Commonly known as: VITAMIN B12 Take 500 mcg by mouth daily.   isosorbide  mononitrate 60 MG 24 hr tablet Commonly known as: IMDUR  TAKE 1 & 1/2 (ONE & ONE-HALF) TABLETS BY MOUTH ONCE DAILY   nitroGLYCERIN  0.4 MG SL tablet Commonly known as: NITROSTAT  Place 1 tablet under the tongue every 5 (five) minutes x 3 doses as needed for chest pain. If no relief after 2nd dose, proceed to the ED for an evaluation or call 911   pantoprazole  40 MG tablet Commonly known as: PROTONIX  40 mg daily.   tamsulosin  0.4 MG Caps capsule Commonly known as: FLOMAX  Take 0.4 mg by mouth daily.   triamcinolone  55 MCG/ACT Aero nasal inhaler Commonly known as: NASACORT  two sprays by Both Nostrils route daily. PRN   valsartan 320 MG tablet Commonly known as: DIOVAN Take 320 mg by mouth daily.   verapamil  180 MG CR tablet Commonly known as: CALAN -SR Take 180 mg by mouth every morning.        Allergies: No Known Allergies  Family History: Family History  Problem Relation Age of Onset   Stroke Mother    Heart  attack Father    Colon cancer Neg Hx     Social History:  reports that he has never smoked. He has never used smokeless tobacco. He reports current alcohol use of about 5.0 standard drinks of alcohol per week. He reports that he does not use drugs.  ROS: All other review of systems were reviewed and are negative except what is noted above in HPI  Physical Exam: BP 135/76   Pulse 69   Constitutional:  Alert and oriented, No acute distress. HEENT: [place] AT, moist mucus membranes.  Trachea midline, no masses. Cardiovascular: No clubbing, cyanosis, or edema. Respiratory: Normal respiratory effort, no increased work of breathing. GI: Abdomen is soft, nontender, nondistended, no abdominal masses GU: No CVA tenderness.  Lymph: No cervical or inguinal lymphadenopathy. Skin: No rashes, bruises or  suspicious lesions. Neurologic: Grossly intact, no focal deficits, moving all 4 extremities. Psychiatric: Normal mood and affect.  Laboratory Data: Lab Results  Component Value Date   WBC 6.5 09/01/2022   HGB 13.7 09/01/2022   HCT 39.6 09/01/2022   MCV 96.1 09/01/2022   PLT 123 (L) 09/01/2022    Lab Results  Component Value Date   CREATININE 0.84 09/01/2022    Lab Results  Component Value Date   PSA 4.73 03/08/2010    No results found for: TESTOSTERONE  Lab Results  Component Value Date   HGBA1C 5.0 08/19/2015    Urinalysis    Component Value Date/Time   COLORURINE YELLOW 05/25/2016 1749   APPEARANCEUR Clear 06/11/2023 1426   LABSPEC 1.024 05/25/2016 1749   PHURINE 5.0 05/25/2016 1749   GLUCOSEU Negative 06/11/2023 1426   GLUCOSEU neg 03/08/2010 1110   HGBUR SMALL (A) 05/25/2016 1749   BILIRUBINUR Negative 06/11/2023 1426   KETONESUR NEGATIVE 05/25/2016 1749   PROTEINUR Negative 06/11/2023 1426   PROTEINUR NEGATIVE 05/25/2016 1749   UROBILINOGEN 0.2 12/31/2008 1727   NITRITE Negative 06/11/2023 1426   NITRITE NEGATIVE 05/25/2016 1749   LEUKOCYTESUR Negative 06/11/2023 1426    Lab Results  Component Value Date   LABMICR Comment 06/11/2023   BACTERIA RARE (A) 05/25/2016    Pertinent Imaging: *** No results found for this or any previous visit.  No results found for this or any previous visit.  No results found for this or any previous visit.  No results found for this or any previous visit.  Results for orders placed during the hospital encounter of 07/23/20  US  RENAL  Narrative CLINICAL DATA:  Postvoid dribbling.  EXAM: RENAL / URINARY TRACT ULTRASOUND COMPLETE  COMPARISON:  Right upper quadrant ultrasound 01/12/2015.  FINDINGS: Right Kidney:  Renal measurements: 10.7 x 5.2 x 6.7 cm = volume: 196.6 mL. Echogenicity within normal limits. 1.2 x 1.0 x 1.1 cm simple cyst. No hydronephrosis visualized.  Left Kidney:  Renal  measurements: 11.4 x 5.4 x 5.8 cm = volume: 185.5 mL. Echogenicity within normal limits. 3.2 x 2.7 x 2.8 cm rounded hyper focus in the upper portion of the right kidney. Although this could represent echogenic fat, it is more echogenic than surrounding renal sinus fat. A hyperechoic mass cannot be excluded. Renal MRI suggested for further evaluation. No hydronephrosis visualized.  Bladder:  Appears normal for degree of bladder distention. Prevoid volume 142 cc. Postvoid volume 70 cc. Bilateral ureteral jets identified.  Other:  None.  IMPRESSION: 1. 3.2 x 2.7 x 2.8 cm possible hyperechoic mass in the upper portion of the right kidney as described above. Renal MRI suggested for further evaluation.  2.  1.2 cm simple cyst left kidney.  3. No hydronephrosis. Bladder is nondistended. Prevoid volume 142 cc. Postvoid volume 70 cc.  These results will be called to the ordering clinician or representative by the Radiologist Assistant, and communication documented in the PACS or [employer].   Electronically Signed By: Debby  Register On: 07/24/2020 10:40  No results found for this or any previous visit.  No results found for this or any previous visit.  No results found for this or any previous visit.   Assessment & Plan:    1. BPH with urinary obstruction (Primary) Continue flomax  0.4mg  daily. Followup 1 year - Urinalysis, Routine w reflex microscopic  2. Recurrent UTI Self start macrobid   3. Urinary frequency Continue flomax  0.4mg  daily   No follow-ups on file.  Belvie Clara, MD  [facility] [place]

## 2023-12-15 ENCOUNTER — Encounter: Payer: Self-pay | Admitting: Cardiology

## 2023-12-15 ENCOUNTER — Ambulatory Visit: Attending: Cardiology | Admitting: Cardiology

## 2023-12-15 ENCOUNTER — Encounter: Payer: Self-pay | Admitting: Urology

## 2023-12-15 VITALS — BP 148/82 | HR 79 | Ht 66.0 in | Wt 201.4 lb

## 2023-12-15 DIAGNOSIS — I10 Essential (primary) hypertension: Secondary | ICD-10-CM | POA: Diagnosis not present

## 2023-12-15 DIAGNOSIS — E782 Mixed hyperlipidemia: Secondary | ICD-10-CM

## 2023-12-15 DIAGNOSIS — I25118 Atherosclerotic heart disease of native coronary artery with other forms of angina pectoris: Secondary | ICD-10-CM

## 2023-12-15 NOTE — Patient Instructions (Addendum)
 Medication Instructions:  Continue all current medications.   Labwork: none  Testing/Procedures: none  Follow-Up: 6 months   Any Other Special Instructions Will Be Listed Below (If Applicable). BP log x 1 week   If you need a refill on your cardiac medications before your next appointment, please call your pharmacy.

## 2023-12-15 NOTE — Progress Notes (Signed)
 Clinical Summary Dennis [name] is a 73 y.o.male seen today for follow up of the following medical problems.   1. CAD   - cath 06/2007 in the setting of unstable angina, PCI with DES to LAD,PTCA to diagonal, and DES to RCA LV gram LVEF 60%   - 01/2013 Stress MPI low risk study, no ischemia - 01/2013 echo LVEF 60-65%, grade II diastolic dysfunction - 08/2015 exercise nuclear stress test: no ischemia      08/2017 cath moderate CAD as reported below, patent stents  06/2020 nuclear stress: no ischemia  - ER visit 09/01/22 with chest pain - trops neg x 2. EKG SR no acute ischemic changes   09/2022 nuclear stress: prior inferior/inferolaterla infarct, no current ischemia.       -last visit we increased imdur  to 90mg  daily due to ongoing chest pain - no chest pains, no SOB/DOE - compliant with meds       2. Dizziness  - orthostatic at prior clinic visit. Symptoms have improved off HCTZ and with increaesd hydration.      3. HTN - compliant with meds - home bp's 120s/70s-80   4. Hyperlipidemia -  prior mylagias on lipitor 20 mg daily, he had done well on  lipitor 10mg  daily.       06/2018 TC 133 TG 207 HDL 36 LDL 56 - 07/2020 TC 878 TG 855 HDL 25 LDL 70 - 07/2021 TC 863 TG 839 HDL 37 LDL 71 07/2022 TC 136 TG 171 HDL 37 LDL 70 - 07/2023 TC 867 TG 863 HDL 36 LDL 72            SH: often travels to [place]. Recent traveled to [place]. Enjoys classic cars. He was good friends with a patient of mine who recently passed away Dennis [name]. He still remains in touch with his wife Dennis [name] who is also a patient of mine.    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 s/p DES to LAD with DES to RCA, and PTCA to D1 in 06/2007 b. 08/2017: cath showing patent  stents with 70% D1, 50% LCx, 60% mid-RCA, and 60% Acute Mrg stenosis. Medical therapy recommended.    GERD (gastroesophageal reflux disease)    Gout    Hypercholesteremia    Hypertension    Myocardial infarction (HCC) 1998     No Known Allergies   Current Outpatient Medications  Medication Sig Dispense Refill   acetaminophen  (TYLENOL ) 500 MG tablet Take 1,000 mg by mouth every 6 (six) hours as needed for mild pain or headache.      aspirin  81 MG EC tablet Take 81 mg by mouth at bedtime.     atorvastatin  (LIPITOR) 10 MG tablet Take 10 mg by mouth at bedtime.   1   cetirizine (ZYRTEC) 10 MG tablet Take 10 mg by mouth at bedtime.     Cranberry 500 MG CAPS Take by mouth.     isosorbide  mononitrate (IMDUR ) 60 MG 24 hr tablet TAKE 1 & 1/2 (ONE & ONE-HALF) TABLETS BY MOUTH ONCE DAILY 45 tablet 6   nitrofurantoin , macrocrystal-monohydrate, (MACROBID ) 100 MG capsule Take 1 capsule (100 mg total) by mouth every 12 (twelve) hours. 14 capsule 3  nitroGLYCERIN  (NITROSTAT ) 0.4 MG SL tablet Place 1 tablet under the tongue every 5 (five) minutes x 3 doses as needed for chest pain. If no relief after 2nd dose, proceed to the ED for an evaluation or call 911     pantoprazole  (PROTONIX ) 40 MG tablet 40 mg daily.     tamsulosin  (FLOMAX ) 0.4 MG CAPS capsule Take 1 capsule (0.4 mg total) by mouth daily after supper. 90 capsule 3   triamcinolone  (NASACORT ) 55 MCG/ACT AERO nasal inhaler two sprays by Both Nostrils route daily. PRN     valsartan (DIOVAN) 320 MG tablet Take 320 mg by mouth daily.     verapamil  (CALAN -SR) 180 MG CR tablet Take 180 mg by mouth every morning.     vitamin B-12 (CYANOCOBALAMIN) 500 MCG tablet Take 500 mcg by mouth daily.     No current facility-administered medications for this visit.     Past Surgical History:  Procedure Laterality Date   bilateral eye surgery     CARDIAC CATHETERIZATION  2009   PCI with DES to LAD,PTCA to diagonal, and DES to RCA LV gram LVEF 60%     CARDIAC SURGERY     COLONOSCOPY     COLONOSCOPY  05/14/2011   Procedure: COLONOSCOPY;  Surgeon: Lamar CHRISTELLA Hollingshead, MD;  [facility];  Service: Endoscopy;  Laterality: N/A;  8:15 AM   LEFT HEART CATH AND CORONARY ANGIOGRAPHY N/A 08/14/2017   Procedure: LEFT HEART CATH AND CORONARY ANGIOGRAPHY;  Surgeon: Swaziland, Peter M, MD;  Location: [facility] INVASIVE CV LAB;  Service: Cardiovascular;  Laterality: N/A;     No Known Allergies    Family History  Problem Relation Age of Onset   Stroke Mother    Heart attack Father    Colon cancer Neg Hx      Social History [name] reports that he has never smoked. He has never used smokeless tobacco. [name] reports current alcohol use of about 5.0 standard drinks of alcohol per week.    Physical Examination Today's Vitals   12/15/23 1513 12/15/23 1536  BP: (!) 140/84 (!) 148/82  Pulse: 79   SpO2: 97%   Weight: 201 lb 6.4 oz (91.4 kg)   Height: 5' 6 (1.676 m)    Body mass index is 32.51 kg/m.  Gen: resting comfortably, no acute distress HEENT: no scleral icterus, pupils equal round and reactive, no palptable cervical adenopathy,  CV: RRR, no mrg, no jvd Resp: Clear to auscultation bilaterally GI: abdomen is soft, non-tender, non-distended, normal bowel sounds, no hepatosplenomegaly MSK: extremities are warm, no edema.  Skin: warm, no rash Neuro:  no focal deficits Psych: appropriate affect   Diagnostic Studies  01/2013 Stress MPI Analysis of the raw perfusion data finds radiotracer uptake in the gut adjacent to the inferior wall.  Tomographic views were obtained using the short axis, vertical long axis, and horizontal long axis planes. There is a small, mild to moderate intensity, inferior perfusion defect that is fixed. No significant reversible defects to indicate ischemia.  Gated imaging reveals an EDV of 70, ESV of 24, normal TID ratio of 0.89, and LVEF of 65% without wall motion  abnormality.  IMPRESSION: Low risk exercise Cardiolite . No chest pain reported, there were no diagnostic ST segment changes at maximum work load of 10.1 METS. Perfusion imaging is most consistent with inferior wall attenuation, no definite scar or ischemic defects. LV volumes are normal with LVEF 65% and no focal wall motion abnormalities.   01/2013 Echo  Study Conclusions  - Left ventricle: The cavity size was normal. There was mild   concentric hypertrophy. Systolic function was normal. The   estimated ejection fraction was in the range of 60% to   65%. Wall motion was normal; there were no regional wall   motion abnormalities. Features are consistent with a   pseudonormal left ventricular filling pattern, with   concomitant abnormal relaxation and increased filling   pressure (grade 2 diastolic dysfunction). - Mitral valve: Trivial regurgitation. - Left atrium: The atrium was at the upper limits of normal   in size. - Tricuspid valve: Physiologic regurgitation. - Pulmonary arteries: Systolic pressure could not be   accurately estimated. - Inferior vena cava: Poorly visualized. Unable to estimate   CVP. - Pericardium, extracardiac: There was no pericardial   effusion. Impressions:  - Comparison to prior study March 2009. Mild LVH with LVEF   60-65%, grade 2 diastolic dysfunction. Upper normal left   atrial size. Trivial mitral and tricuspid regurgitation.   Unable to assess PASP or CVP.      08/2015 MPI There was no ST segment deviation noted during stress. Findings consistent with prior inferior myocardial infarction. There is no current ischemia This is a low risk study. The left ventricular ejection fraction is hyperdynamic (>65%). Duke treadmill score of 5, consistent with low risk for major cardiac events   08/2017 cath Previously placed Prox LAD to Mid LAD stent (unknown type) is widely patent. 1st Diag lesion is 70% stenosed. Mid Cx to Dist Cx lesion is 50%  stenosed. Previously placed Prox RCA to Mid RCA stent (unknown type) is widely patent. Prox RCA lesion is 40% stenosed. Acute Mrg lesion is 60% stenosed. Mid RCA lesion is 60% stenosed. The left ventricular systolic function is normal. LV end diastolic pressure is normal. The left ventricular ejection fraction is 55-65% by visual estimate.   1. Modest 3 vessel CAD    - 70% first diagonal    - 50% long distal LCx    - 60% mid RCA and RV marginal Mortimer Bair 2. Continued excellent patency of stents in the LAD and RCA 3. Normal LV function   Plan: I do not see any critical disease that would explain his resting chest pain. The first diagonal was severely stenosed post stenting of the LAD and this has improved. I would recommend continued medical therapy   06/2020 nuclear stress There was no ST segment deviation noted during stress. No T wave inversion was noted during stress. Findings consistent with prior inferior myocardial infarction. There is no current ischemia This is a low risk study. The left ventricular ejection fraction is hyperdynamic (>65%).        09/2022 nuclear stress  Findings are consistent with prior inferior/inferolateral infarction. There is no current ischemia.  The study is low risk.   No ST deviation was noted.   LV perfusion is abnormal.   Left ventricular function is normal. Nuclear stress EF: 63 %. The left ventricular ejection fraction is normal (55-65%). End diastolic cavity size is normal.     Assessment and Plan   1. CAD with other forms of angina -prior chest pain has resolved with increase in imdur  last visit - stress test 09/2022 no ischemia - no recent symptoms, continue current meds   2. HTN - bp above goal, reports home numbers are controlled. Will submit bp log 1 week. Room to titrate verapamil  if needed   3. HLD - LDL reasonable given medication limitations - he  will continue current therapy.     Dorn PHEBE Ross, M.D.

## 2023-12-15 NOTE — Patient Instructions (Signed)

## 2023-12-21 ENCOUNTER — Encounter: Payer: Self-pay | Admitting: Podiatry

## 2023-12-21 ENCOUNTER — Ambulatory Visit: Admitting: Podiatry

## 2023-12-21 VITALS — Ht 66.0 in | Wt 201.0 lb

## 2023-12-21 DIAGNOSIS — L600 Ingrowing nail: Secondary | ICD-10-CM

## 2023-12-21 DIAGNOSIS — M7752 Other enthesopathy of left foot: Secondary | ICD-10-CM | POA: Diagnosis not present

## 2023-12-21 MED ORDER — TRIAMCINOLONE ACETONIDE 10 MG/ML IJ SUSP
10.0000 mg | Freq: Once | INTRAMUSCULAR | Status: AC
  Administered 2023-12-21: 10 mg via INTRA_ARTICULAR

## 2023-12-23 NOTE — Progress Notes (Signed)
 Subjective:   Patient ID: Dennis [name], male   DOB: 73 y.o.   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  I educated him on total nail removal second left and he wants to do this it is scheduled for several weeks with all questions answered today and I went ahead today did sterile prep and injected the second MPJ 3 mg dexamethasone Kenalog  5 mg Xylocaine  and applied sterile dressing

## 2023-12-24 ENCOUNTER — Encounter: Payer: Self-pay | Admitting: Cardiology

## 2023-12-30 ENCOUNTER — Ambulatory Visit: Admitting: Podiatry

## 2024-01-06 ENCOUNTER — Ambulatory Visit: Admitting: Podiatry

## 2024-01-06 ENCOUNTER — Encounter: Payer: Self-pay | Admitting: Podiatry

## 2024-01-06 DIAGNOSIS — L600 Ingrowing nail: Secondary | ICD-10-CM

## 2024-01-06 NOTE — Patient Instructions (Signed)

## 2024-01-07 NOTE — Progress Notes (Signed)
 Subjective:   Patient ID: Dennis [name], male   DOB: 73 y.o.   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  Patient records consent form understanding risk and today I infiltrated the left second toe 60 mg Xylocaine  Marcaine mixture with sterile prep done using sterile instrumentation remove the nail exposed matrix and applied phenol for applications 30 seconds followed by alcohol lavage sterile dressing.  Gave instructions on soaks wear dressing 24 hours take it off earlier if any throbbing were to occur and encouraged to call with questions during recovery

## 2024-01-13 ENCOUNTER — Ambulatory Visit: Payer: Self-pay | Admitting: Cardiology

## 2024-01-14 ENCOUNTER — Encounter: Payer: Self-pay | Admitting: *Deleted

## 2024-02-23 ENCOUNTER — Other Ambulatory Visit: Payer: Self-pay

## 2024-02-25 MED ORDER — ISOSORBIDE MONONITRATE ER 60 MG PO TB24: 90.0000 mg | ORAL_TABLET | Freq: Every day | ORAL | 3 refills | Status: AC

## 2024-02-29 ENCOUNTER — Encounter (HOSPITAL_COMMUNITY): Payer: Self-pay | Admitting: Emergency Medicine

## 2024-02-29 ENCOUNTER — Other Ambulatory Visit: Payer: Self-pay

## 2024-02-29 ENCOUNTER — Telehealth: Payer: Self-pay | Admitting: *Deleted

## 2024-02-29 ENCOUNTER — Emergency Department (HOSPITAL_COMMUNITY)
Admission: EM | Admit: 2024-02-29 | Discharge: 2024-02-29 | Disposition: A | Discharge status: Home / Self Care | Attending: Emergency Medicine | Admitting: Emergency Medicine

## 2024-02-29 ENCOUNTER — Emergency Department (HOSPITAL_COMMUNITY)

## 2024-02-29 DIAGNOSIS — R072 Precordial pain: Secondary | ICD-10-CM | POA: Insufficient documentation

## 2024-02-29 DIAGNOSIS — I2510 Atherosclerotic heart disease of native coronary artery without angina pectoris: Secondary | ICD-10-CM | POA: Diagnosis not present

## 2024-02-29 DIAGNOSIS — Z7982 Long term (current) use of aspirin: Secondary | ICD-10-CM | POA: Insufficient documentation

## 2024-02-29 DIAGNOSIS — R0789 Other chest pain: Secondary | ICD-10-CM

## 2024-02-29 DIAGNOSIS — M5412 Radiculopathy, cervical region: Secondary | ICD-10-CM

## 2024-02-29 DIAGNOSIS — M546 Pain in thoracic spine: Secondary | ICD-10-CM | POA: Insufficient documentation

## 2024-02-29 DIAGNOSIS — I25118 Atherosclerotic heart disease of native coronary artery with other forms of angina pectoris: Secondary | ICD-10-CM

## 2024-02-29 DIAGNOSIS — R0602 Shortness of breath: Secondary | ICD-10-CM | POA: Diagnosis present

## 2024-02-29 DIAGNOSIS — R0609 Other forms of dyspnea: Secondary | ICD-10-CM

## 2024-02-29 DIAGNOSIS — E785 Hyperlipidemia, unspecified: Secondary | ICD-10-CM

## 2024-02-29 DIAGNOSIS — I10 Essential (primary) hypertension: Secondary | ICD-10-CM

## 2024-02-29 LAB — CBC WITH DIFFERENTIAL/PLATELET
Abs Immature Granulocytes: 0.06 K/uL (ref 0.00–0.07)
Basophils Absolute: 0.1 K/uL (ref 0.0–0.1)
Basophils Relative: 1 %
Eosinophils Absolute: 0.1 K/uL (ref 0.0–0.5)
Eosinophils Relative: 2 %
HCT: 41.6 % (ref 39.0–52.0)
Hemoglobin: 14.7 g/dL (ref 13.0–17.0)
Immature Granulocytes: 1 %
Lymphocytes Relative: 22 %
Lymphs Abs: 1.2 K/uL (ref 0.7–4.0)
MCH: 34.2 pg — ABNORMAL HIGH (ref 26.0–34.0)
MCHC: 35.3 g/dL (ref 30.0–36.0)
MCV: 96.7 fL (ref 80.0–100.0)
Monocytes Absolute: 0.4 K/uL (ref 0.1–1.0)
Monocytes Relative: 8 %
Neutro Abs: 3.4 K/uL (ref 1.7–7.7)
Neutrophils Relative %: 66 %
Platelets: 134 K/uL — ABNORMAL LOW (ref 150–400)
RBC: 4.3 MIL/uL (ref 4.22–5.81)
RDW: 13.3 % (ref 11.5–15.5)
WBC: 5.2 K/uL (ref 4.0–10.5)
nRBC: 0 % (ref 0.0–0.2)

## 2024-02-29 LAB — TROPONIN T, HIGH SENSITIVITY
Troponin T High Sensitivity: <15 ng/L (ref 0–19)
Troponin T High Sensitivity: <15 ng/L (ref 0–19)

## 2024-02-29 LAB — COMPREHENSIVE METABOLIC PANEL WITH GFR
ALT: 18 U/L (ref 0–44)
AST: 19 U/L (ref 15–41)
Albumin: 4.5 g/dL (ref 3.5–5.0)
Alkaline Phosphatase: 79 U/L (ref 38–126)
Anion gap: 10 (ref 5–15)
BUN: 18 mg/dL (ref 8–23)
CO2: 25 mmol/L (ref 22–32)
Calcium: 9.1 mg/dL (ref 8.9–10.3)
Chloride: 106 mmol/L (ref 98–111)
Creatinine, Ser: 0.86 mg/dL (ref 0.61–1.24)
GFR, Estimated: >60 mL/min (ref >60)
Glucose, Bld: 113 mg/dL — ABNORMAL HIGH (ref 70–99)
Potassium: 4.1 mmol/L (ref 3.5–5.1)
Sodium: 141 mmol/L (ref 135–145)
Total Bilirubin: 1 mg/dL (ref 0.0–1.2)
Total Protein: 7 g/dL (ref 6.5–8.1)

## 2024-02-29 LAB — D-DIMER, QUANTITATIVE: D-Dimer, Quant: 0.32 ug{FEU}/mL (ref 0.00–0.50)

## 2024-02-29 LAB — PRO BRAIN NATRIURETIC PEPTIDE: Pro Brain Natriuretic Peptide: 64.4 pg/mL (ref <300.0)

## 2024-02-29 NOTE — Medical Student Note (Incomplete)
 [facility] DEPT Provider Student Note For educational purposes for Medical, PA and NP students only and not part of the legal medical record.   CSN: [id number] Arrival date & time: 02/29/24  9256      History   Chief Complaint Chief Complaint  Patient presents with   Shortness of Breath    HPI Dennis [name] is a 73 y.o. male.  With PMH of CAD s/p cath 06/2007 in the setting of unstable angina, PCI with DES to LAD, PTCA to diagonal, and DES to RCA LV gram LVEF 60%. His last stress test was in 2017. He states Friday he had some tingling in his right arm which has since resolved. He is also having pain in mid-to-upper back area. He states he occasionally has mild chest pain that spontaneously resolves. Concerned for SOB that is worse with exertion and better with rest. His medications include aspirin , isosorb, and atorvastatin .      Shortness of Breath Severity:  Mild Duration:  2 days Timing:  Intermittent Progression:  Waxing and waning Context: activity   Relieved by:  Lying down and rest Worsened by:  Exertion Associated symptoms: no abdominal pain and no fever     Past Medical History:  Diagnosis Date   Coronary artery disease    a. s/p DES to LAD with DES to RCA, and PTCA to D1 in 06/2007 b. 08/2017: cath showing patent stents with 70% D1, 50% LCx, 60% mid-RCA, and 60% Acute Mrg stenosis. Medical therapy recommended.    GERD (gastroesophageal reflux disease)    Gout    Hypercholesteremia    Hypertension    Myocardial infarction [facility]) 1998    Patient Active Problem List   Diagnosis Date Noted   Angina pectoris 08/19/2015   Hyperglycemia 04/27/2014   Gout    Hypertension    Cardiovascular disease 08/26/2009   GASTROESOPHAGEAL REFLUX DISEASE 08/26/2009   Hypercholesterolemia 01/15/2009   RHINITIS, CHRONIC 01/15/2009    Past Surgical History:  Procedure Laterality Date   bilateral eye surgery     CARDIAC CATHETERIZATION  2009   PCI with DES to  LAD,PTCA to diagonal, and DES to RCA LV gram LVEF 60%    CARDIAC SURGERY     COLONOSCOPY     COLONOSCOPY  05/14/2011   Procedure: COLONOSCOPY;  Surgeon: Lamar CHRISTELLA Hollingshead, MD;  Location: [facility] SUITE;  Service: Endoscopy;  Laterality: N/A;  8:15 AM   LEFT HEART CATH AND CORONARY ANGIOGRAPHY N/A 08/14/2017   Procedure: LEFT HEART CATH AND CORONARY ANGIOGRAPHY;  Surgeon: Jordan, Peter M, MD;  Location: [facility] INVASIVE CV LAB;  Service: Cardiovascular;  Laterality: N/A;       Home Medications    Prior to Admission medications   Medication Sig Start Date End Date Taking? Authorizing Provider  acetaminophen  (TYLENOL ) 500 MG tablet Take 1,000 mg by mouth every 6 (six) hours as needed for mild pain or headache.     [provider]  aspirin  81 MG EC tablet Take 81 mg by mouth at bedtime.    [provider]  atorvastatin  (LIPITOR) 10 MG tablet Take 10 mg by mouth at bedtime.  04/02/14   [provider]  cetirizine (ZYRTEC) 10 MG tablet Take 10 mg by mouth at bedtime. 04/24/23   [provider]  Cranberry 500 MG CAPS Take by mouth.    [provider]  isosorbide  mononitrate (IMDUR ) 60 MG 24 hr tablet Take 1.5 tablets (90 mg total) by mouth daily. 02/25/24  Alvan Dorn FALCON, MD  nitrofurantoin , macrocrystal-monohydrate, (MACROBID ) 100 MG capsule Take 1 capsule (100 mg total) by mouth every 12 (twelve) hours. 12/14/23   McKenzie, Belvie CROME, MD  nitroGLYCERIN  (NITROSTAT ) 0.4 MG SL tablet Place 1 tablet under the tongue every 5 (five) minutes x 3 doses as needed for chest pain. If no relief after 2nd dose, proceed to the ED for an evaluation or call 911 01/23/20   [provider]  pantoprazole  (PROTONIX ) 40 MG tablet 40 mg daily. 04/19/18   [provider]  tamsulosin  (FLOMAX ) 0.4 MG CAPS capsule Take 1 capsule (0.4 mg total) by mouth daily after supper. 12/14/23   McKenzie, Belvie CROME, MD  triamcinolone  (NASACORT ) 55 MCG/ACT AERO nasal inhaler two sprays  by Both Nostrils route daily. PRN    [provider]  valsartan (DIOVAN) 320 MG tablet Take 320 mg by mouth daily.    [provider]  verapamil  (CALAN -SR) 180 MG CR tablet Take 180 mg by mouth every morning.    [provider]  vitamin B-12 (CYANOCOBALAMIN) 500 MCG tablet Take 500 mcg by mouth daily.    [provider]    Family History Family History  Problem Relation Age of Onset   Stroke Mother    Heart attack Father    Colon cancer Neg Hx     Social History Social History   Tobacco Use   Smoking status: Never   Smokeless tobacco: Never  Vaping Use   Vaping status: Never Used  Substance Use Topics   Alcohol use: Yes    Alcohol/week: 5.0 standard drinks of alcohol    Types: 5 Cans of beer per week    Comment: occ   Drug use: No     Allergies   Patient has no known allergies.   Review of Systems Review of Systems  Constitutional:  Negative for fever.  Respiratory:  Positive for shortness of breath.   Gastrointestinal:  Negative for abdominal pain.     Physical Exam Updated Vital Signs BP 131/77 (BP Location: Right Arm)   Pulse 80   Temp 98.5 F (36.9 C) (Oral)   Resp 18   Ht 5' 6 (1.676 m)   Wt 88.5 kg   SpO2 98%   BMI 31.47 kg/m   Physical Exam   ED Treatments / Results  Labs (all labs ordered are listed, but only abnormal results are displayed) Labs Reviewed  COMPREHENSIVE METABOLIC PANEL WITH GFR  CBC WITH DIFFERENTIAL/PLATELET  D-DIMER, QUANTITATIVE  TROPONIN T, HIGH SENSITIVITY    EKG  Radiology No results found.  Procedures Procedures (including critical care time)  Medications Ordered in ED Medications - No data to display   Initial Impression / Assessment and Plan / ED Course  I have reviewed the triage vital signs and the nursing notes.  Pertinent labs & imaging results that were available during my care of the patient were reviewed by me and considered in my medical decision making  (see chart for details).       Final Clinical Impressions(s) / ED Diagnoses   Final diagnoses:  None    New Prescriptions New Prescriptions   No medications on file

## 2024-02-29 NOTE — Telephone Encounter (Signed)
 Orders placed for Lexi and Echo for DOE

## 2024-02-29 NOTE — Discharge Instructions (Signed)
 Please follow-up closely with cardiology and your primary care doctor on an outpatient basis.  Return to emergency department immediately for any new or worsening symptoms.

## 2024-02-29 NOTE — ED Provider Notes (Signed)
 [place] EMERGENCY DEPARTMENT AT [facility] Provider Note   CSN: [id number] Arrival date & time: 02/29/24  9256     Patient presents with: Shortness of Breath   Dennis [name] is a 73 y.o. male.   Patient is a 73 year old male who presents to Emergency Department with a chief complaint of intermittent exertional dyspnea which has been ongoing for approximate the past 3 days.  He notes that he has had some pain in the middle of his back as well.  He does admit to some intermittent substernal chest pain with exertion.  He does have a history of previous MI with most recent drug-eluting stent in 2019.  He notes he last saw his cardiologist approximately 2 months ago.  He denies any active chest pain or shortness of breath at this time at rest.  He denies any abdominal pain, nausea, vomiting, diarrhea.  He has had no increased edema in lower extremities.  He has had no cough, congestion, rhinorrhea, sore throat.   Shortness of Breath      Prior to Admission medications   Medication Sig Start Date End Date Taking? Authorizing Provider  acetaminophen  (TYLENOL ) 500 MG tablet Take 1,000 mg by mouth every 6 (six) hours as needed for mild pain or headache.     [provider]  aspirin  81 MG EC tablet Take 81 mg by mouth at bedtime.    [provider]  atorvastatin  (LIPITOR) 10 MG tablet Take 10 mg by mouth at bedtime.  04/02/14   [provider]  cetirizine (ZYRTEC) 10 MG tablet Take 10 mg by mouth at bedtime. 04/24/23   [provider]  Cranberry 500 MG CAPS Take by mouth.    [provider]  isosorbide  mononitrate (IMDUR ) 60 MG 24 hr tablet Take 1.5 tablets (90 mg total) by mouth daily. 02/25/24   Alvan Dorn FALCON, MD  nitrofurantoin , macrocrystal-monohydrate, (MACROBID ) 100 MG capsule Take 1 capsule (100 mg total) by mouth every 12 (twelve) hours. 12/14/23   McKenzie, Belvie CROME, MD  nitroGLYCERIN  (NITROSTAT ) 0.4 MG SL tablet Place 1  tablet under the tongue every 5 (five) minutes x 3 doses as needed for chest pain. If no relief after 2nd dose, proceed to the ED for an evaluation or call 911 01/23/20   [provider]  pantoprazole  (PROTONIX ) 40 MG tablet 40 mg daily. 04/19/18   [provider]  tamsulosin  (FLOMAX ) 0.4 MG CAPS capsule Take 1 capsule (0.4 mg total) by mouth daily after supper. 12/14/23   McKenzie, Belvie CROME, MD  triamcinolone  (NASACORT ) 55 MCG/ACT AERO nasal inhaler two sprays by Both Nostrils route daily. PRN    [provider]  valsartan (DIOVAN) 320 MG tablet Take 320 mg by mouth daily.    [provider]  verapamil  (CALAN -SR) 180 MG CR tablet Take 180 mg by mouth every morning.    [provider]  vitamin B-12 (CYANOCOBALAMIN) 500 MCG tablet Take 500 mcg by mouth daily.    [provider]    Allergies: Patient has no known allergies.    Review of Systems  Respiratory:  Positive for shortness of breath.   All other systems reviewed and are negative.   Updated Vital Signs BP 131/77 (BP Location: Right Arm)   Pulse 80   Temp 98.5 F (36.9 C) (Oral)   Resp 18   Ht 5' 6 (1.676 m)   Wt 88.5 kg   SpO2 98%   BMI 31.47 kg/m   Physical  Exam Vitals and nursing note reviewed.  Constitutional:      General: He is not in acute distress.    Appearance: Normal appearance. He is not ill-appearing.  HENT:     Head: Normocephalic and atraumatic.     Nose: Nose normal.     Mouth/Throat:     Mouth: Mucous membranes are moist.  Eyes:     Extraocular Movements: Extraocular movements intact.     Conjunctiva/sclera: Conjunctivae normal.     Pupils: Pupils are equal, round, and reactive to light.  Cardiovascular:     Rate and Rhythm: Normal rate and regular rhythm.     Pulses: Normal pulses.     Heart sounds: Normal heart sounds. No murmur heard.    No gallop.  Pulmonary:     Effort: Pulmonary effort is normal. No tachypnea.     Breath sounds: Normal  breath sounds. No decreased breath sounds, wheezing, rhonchi or rales.  Chest:     Chest wall: No tenderness.  Abdominal:     General: Abdomen is flat. Bowel sounds are normal.     Palpations: Abdomen is soft.  Musculoskeletal:        General: Normal range of motion.     Cervical back: Normal range of motion and neck supple.     Right lower leg: No edema.     Left lower leg: No edema.  Skin:    General: Skin is warm and dry.  Neurological:     General: No focal deficit present.     Mental Status: He is alert and oriented to person, place, and time. Mental status is at baseline.  Psychiatric:        Mood and Affect: Mood normal.        Behavior: Behavior normal.        Thought Content: Thought content normal.        Judgment: Judgment normal.     (all labs ordered are listed, but only abnormal results are displayed) Labs Reviewed  COMPREHENSIVE METABOLIC PANEL WITH GFR  CBC WITH DIFFERENTIAL/PLATELET  D-DIMER, QUANTITATIVE  TROPONIN T, HIGH SENSITIVITY    EKG: None  Radiology: [facility] Chest Port 1 View Result Date: 02/29/2024 CLINICAL DATA:  Shortness of breath. EXAM: PORTABLE CHEST 1 VIEW COMPARISON:  Chest CT dated 09/01/2022. FINDINGS: No focal consolidation, pleural effusion, pneumothorax. The cardiac silhouette is within normal limits. Moderate size hiatal hernia. No acute osseous pathology. IMPRESSION: 1. No active disease. 2. Moderate size hiatal hernia. Electronically Signed   By: Vanetta Chou M.D.   On: 02/29/2024 08:23     Procedures   Medications Ordered in the ED - No data to display                                  Medical Decision Making Amount and/or Complexity of Data Reviewed Labs: ordered. Radiology: ordered.   This patient presents to the ED for concern of shortness of breath, chest pain differential diagnosis includes ACS, pulmonary embolus, CHF, pericarditis, myocarditis, endocarditis, aortic aneurysm or dissection, pneumonia, pneumothorax,  hemothorax    Additional history obtained:  Additional history obtained from medical records External records from outside source obtained and reviewed including medical records   Lab Tests:  I Ordered, and personally interpreted labs.  The pertinent results include: No leukocytosis, no anemia, chronic thrombocytopenia, normal electrolytes, normal kidney function liver function, negative D-dimer, negative serial troponins, normal BNP   Imaging  Studies ordered:  I ordered imaging studies including chest x-ray I independently visualized and interpreted imaging which showed no acute cardiopulmonary process I agree with the radiologist interpretation    Problem List / ED Course:  Patient is doing very well at this time and is stable for discharge home.  Patient was fully evaluated by Dr. Mallipeddi with cardiology who did offer the patient admission versus outpatient management and he does wish to be discharged home at this time.  Do suspect that this is reasonable and he will follow-up with cardiology outpatient for echo.  He has no indication for acute CHF at this point.  He has negative D-dimer and do not suspect pulmonary embolus.  Chest x-ray was unremarkable with no indication for pneumonia, pneumothorax, hemothorax.  Low suspicion for aortic aneurysm or dissection in this patient.  Do not suspect any further emergent workup is warranted at this time.  He will follow-up closely with cardiology.  Strict turn precautions were discussed for any new or worsening symptoms.  Patient voiced understanding and had no additional questions.   Social Determinants of Health:  None        Final diagnoses:  None    ED Discharge Orders     None          Daralene Lonni JONETTA DEVONNA 02/29/24 1354    Charlyn Sora, MD 03/01/24 [phone]

## 2024-02-29 NOTE — ED Notes (Signed)
 Pt did to laps around the nursing station without assistance. Pt O2 stats were 94%, pt stated no s/s of [facility]

## 2024-02-29 NOTE — ED Triage Notes (Signed)
 Pt states he gets short of breath when walking with no known recent illness.  Pt states on Friday his arm started tingling but it is not tingling now.  Pt states he had a MI in 1998.

## 2024-02-29 NOTE — Consult Note (Signed)
 Cardiology Consultation   Patient ID: Dennis [name] MRN: [id number]; DOB: Jan 23, 1951  Admit date: 02/29/2024 Date of Consult: 02/29/2024  PCP:  Sheryle Carwin, MD    [facility] Providers Cardiologist:  Alvan Carrier, MD        Patient Profile: Dennis [name] is a 73 y.o. male with a hx of CAD (s/p DES to LAD, DES to RCA and PTCA of D1 in 06/2007, cath in 08/2017 showing patent stents with moderate CAD and medical management recommended, NST in 09/2022 showing no current ischemia), HTN, HLD and orthostatic hypotension who is being seen 02/29/2024 for the evaluation of chest pain at the request of Lonni Conger, PA.  History of Present Illness:  [name] was examined by Dr. Alvan in 12/2023 and denied any recent anginal symptoms at that time. BP was elevated but he reported numbers had been well-controlled when checked at home. He was continued on ASA 81 mg daily, Atorvastatin  10 mg daily, Imdur  90 mg daily, Valsartan 320 mg daily and Verapamil  180 mg daily.  He presented to [facility] ED this morning for evaluation of worsening shortness of breath with exertion over the past 3 days. In talking with the patient and his wife today, he reports he has noticed intermittent shortness of breath over the past 2 to 3 days which occurs sporadically. 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 He reports having intermittent sinus congestion but no recent progression of symptoms. He denies any chest pain. No specific orthopnea, PND or lower extremity edema.  e reports an occasional pins and needle sensation along his arms which is typically most notable along his right forearm. Also reports occasional episodes of back pain which resolve with Tylenol . He did have more noticeable pain this morning but reports pain has since resolved since he took Tylenol  at home.  Initial labs showed WBC 5.2, Hgb 14.7, platelets 134, Na+ 141, K+ 4.1 and creatinine  0.86. AST 19 and ALT 18. D-dimer negative at 0.32. Initial and repeat Troponin T values negative at < 15. CXR shows a moderate size hiatal hernia but no acute lung abnormalities. EKG shows NSR, HR 71 with no acute ST changes.   Past Medical History:  Diagnosis Date   Coronary artery disease    a. s/p DES to LAD with DES to RCA, and PTCA to D1 in 06/2007 b. 08/2017: cath showing patent stents with 70% D1, 50% LCx, 60% mid-RCA, and 60% Acute Mrg stenosis. Medical therapy recommended.    GERD (gastroesophageal reflux disease)    Gout    Hypercholesteremia    Hypertension    Myocardial infarction (HCC) 1998    Past Surgical History:  Procedure Laterality Date   bilateral eye surgery     CARDIAC CATHETERIZATION  2009   PCI with DES to LAD,PTCA to diagonal, and DES to RCA LV gram LVEF 60%    CARDIAC SURGERY     COLONOSCOPY     COLONOSCOPY  05/14/2011   Procedure: COLONOSCOPY;  Surgeon: Lamar CHRISTELLA Hollingshead, MD;  Location: [facility];  Service: Endoscopy;  Laterality: N/A;  8:15 AM   LEFT HEART CATH AND CORONARY ANGIOGRAPHY N/A 08/14/2017   Procedure: LEFT HEART CATH AND CORONARY ANGIOGRAPHY;  Surgeon: Jordan, Peter M, MD;  Location: [facility] INVASIVE CV LAB;  Service: Cardiovascular;  Laterality: N/A;     Home Medications:  Prior to Admission medications   Medication Sig Start Date End Date Taking? Authorizing Provider  acetaminophen  (TYLENOL ) 500 MG tablet Take 1,000 mg by mouth every 6 (six) hours as needed for mild pain or headache.     [provider]  aspirin  81 MG EC tablet Take 81 mg by mouth at bedtime.    [provider]  atorvastatin  (LIPITOR) 10 MG tablet Take 10 mg by mouth at bedtime.  04/02/14   [provider]  cetirizine (ZYRTEC) 10 MG tablet Take 10 mg by mouth at bedtime. 04/24/23   [provider]  Cranberry 500 MG CAPS Take by mouth.    [provider]  isosorbide  mononitrate (IMDUR ) 60 MG 24 hr tablet Take 1.5 tablets (90 mg total) by  mouth daily. 02/25/24   Alvan Dorn FALCON, MD  nitrofurantoin , macrocrystal-monohydrate, (MACROBID ) 100 MG capsule Take 1 capsule (100 mg total) by mouth every 12 (twelve) hours. 12/14/23   McKenzie, Belvie CROME, MD  nitroGLYCERIN  (NITROSTAT ) 0.4 MG SL tablet Place 1 tablet under the tongue every 5 (five) minutes x 3 doses as needed for chest pain. If no relief after 2nd dose, proceed to the ED for an evaluation or call 911 01/23/20   [provider]  pantoprazole  (PROTONIX ) 40 MG tablet 40 mg daily. 04/19/18   [provider]  tamsulosin  (FLOMAX ) 0.4 MG CAPS capsule Take 1 capsule (0.4 mg total) by mouth daily after supper. 12/14/23   McKenzie, Belvie CROME, MD  triamcinolone  (NASACORT ) 55 MCG/ACT AERO nasal inhaler two sprays by Both Nostrils route daily. PRN    [provider]  valsartan (DIOVAN) 320 MG tablet Take 320 mg by mouth daily.    [provider]  verapamil  (CALAN -SR) 180 MG CR tablet Take 180 mg by mouth every morning.    [provider]  vitamin B-12 (CYANOCOBALAMIN) 500 MCG tablet Take 500 mcg by mouth daily.    [provider]    Scheduled Meds:  Continuous Infusions:  PRN Meds:   Allergies:   No Known Allergies  Social History:   Social History   Socioeconomic History   Marital status: Married    Spouse name: Not on file   Number of children: Not on file   Years of education: Not on file   Highest education level: Not on file  Occupational History   Not on file  Tobacco Use   Smoking status: Never   Smokeless tobacco: Never  Vaping Use   Vaping status: Never Used  Substance and Sexual Activity   Alcohol use: Yes    Alcohol/week: 5.0 standard drinks of alcohol    Types: 5 Cans of beer per week    Comment: occ   Drug use: No   Sexual activity: Yes  Other Topics Concern   Not on file  Social History Narrative   Not on file   Social Drivers of Health   Financial Resource Strain: Not on file  Food Insecurity:  Not on file  Transportation Needs: Not on file  Physical Activity: Not on file  Stress: Not on file  Social Connections: Unknown (09/24/2021)   Received from [facility]   Social Network    Social Network: Not on file  Intimate Partner Violence: Unknown (09/24/2021)   Received from [facility]   HITS    Physically Hurt: Not on file    Insult or Talk Down To: Not on file    Threaten Physical Harm: Not on file    Scream or Curse: Not on file    Family History:    Family History  Problem Relation Age of Onset   Stroke Mother    Heart attack Father    Colon cancer Neg Hx      ROS:  Please see the history of present illness.   All other ROS reviewed and negative.     Physical Exam/Data: Vitals:   02/29/24 1000 02/29/24 1015 02/29/24 1046 02/29/24 1130  BP: 123/83 131/81 (!) 146/91 (!) 143/99  Pulse: 60 62 63 63  Resp: 13 15 18 14   Temp:   98 F (36.7 C)   TempSrc:   Oral   SpO2: 97% 96% 100% 97%  Weight:      Height:       No intake or output data in the 24 hours ending 02/29/24 1152    02/29/2024    7:54 AM 12/21/2023    1:35 PM 12/15/2023    3:13 PM  Last 3 Weights  Weight (lbs) 195 lb 201 lb 201 lb 6.4 oz  Weight (kg) 88.451 kg 91.173 kg 91.354 kg     Body mass index is 31.47 kg/m.  General:  Well nourished, well developed male appearing in no acute distress HEENT: normal Neck: no JVD Vascular: No carotid bruits; Distal pulses 2+ bilaterally Cardiac:  normal S1, S2; RRR; no murmur  Lungs:  clear to auscultation bilaterally, no wheezing, rhonchi or rales  Abd: soft, nontender, no hepatomegaly  Ext: no pitting edema Musculoskeletal:  No deformities, BUE and BLE strength normal and equal Skin: warm and dry  Neuro:  CNs 2-12 intact, no focal abnormalities noted Psych:  Normal affect   EKG:  The EKG was personally reviewed and demonstrates: NSR, HR 71 with no acute ST changes.  Telemetry:  Telemetry was personally reviewed and demonstrates: NSR, HR in 60's  to 70's.   Relevant CV Studies:  Cardiac Catheterization: 08/2017 Previously placed Prox LAD to Mid LAD stent (unknown type) is widely patent. 1st Diag lesion is 70% stenosed. Mid Cx to Dist Cx lesion is 50% stenosed. Previously placed Prox RCA to Mid RCA stent (unknown type) is widely patent. Prox RCA lesion is 40% stenosed. Acute Mrg lesion is 60% stenosed. Mid RCA lesion is 60% stenosed. The left ventricular systolic function is normal. LV end diastolic pressure is normal. The left ventricular ejection fraction is 55-65% by visual estimate.   1. Modest 3 vessel CAD    - 70% first diagonal    - 50% long distal LCx    - 60% mid RCA and RV marginal branch 2. Continued excellent patency of stents in the LAD and RCA 3. Normal LV function   Plan: I do not see any critical disease that would explain his resting chest pain. The first diagonal was severely stenosed post stenting of the LAD and this has improved. I would recommend continued medical therapy.    Echocardiogram: 10/2021 IMPRESSIONS     1. Left ventricular ejection fraction, by estimation, is 60 to 65%. The  left ventricle has normal function. The left ventricle has no regional  wall motion abnormalities. There is mild left ventricular hypertrophy.  Left ventricular diastolic parameters  were normal.   2. Right ventricular systolic function is normal. The right ventricular  size is normal. Tricuspid regurgitation signal is inadequate for assessing  PA pressure.   3. Left atrial size was mildly dilated.   4. The mitral valve is normal in structure. Trivial mitral valve  regurgitation. No evidence of mitral stenosis.   5. The aortic valve is tricuspid. Aortic valve regurgitation is  trivial.  No aortic stenosis is present.   NST: 09/2022   Findings are consistent with prior inferior/inferolateral infarction. There is no current ischemia.  The study is low risk.   No ST deviation was noted.   LV perfusion is  abnormal.   Left ventricular function is normal. Nuclear stress EF: 63 %. The left ventricular ejection fraction is normal (55-65%). End diastolic cavity size is normal.   Laboratory Data: High Sensitivity Troponin:  No results for input(s): TROPONINIHS in the last 720 hours.   Chemistry Recent Labs  Lab 02/29/24 0823  NA 141  K 4.1  CL 106  CO2 25  GLUCOSE 113*  BUN 18  CREATININE 0.86  CALCIUM  9.1  GFRNONAA >60  ANIONGAP 10    Recent Labs  Lab 02/29/24 0823  PROT 7.0  ALBUMIN 4.5  AST 19  ALT 18  ALKPHOS 79  BILITOT 1.0   Lipids No results for input(s): CHOL, TRIG, HDL, LABVLDL, LDLCALC, CHOLHDL in the last 168 hours.  Hematology Recent Labs  Lab 02/29/24 0823  WBC 5.2  RBC 4.30  HGB 14.7  HCT 41.6  MCV 96.7  MCH 34.2*  MCHC 35.3  RDW 13.3  PLT 134*   Thyroid  No results for input(s): TSH, FREET4 in the last 168 hours.  BNPNo results for input(s): BNP, PROBNP in the last 168 hours.  DDimer  Recent Labs  Lab 02/29/24 9176  DDIMER 0.32    Radiology/Studies:  [facility] Chest Port 1 View Result Date: 02/29/2024 CLINICAL DATA:  Shortness of breath. EXAM: PORTABLE CHEST 1 VIEW COMPARISON:  Chest CT dated 09/01/2022. FINDINGS: No focal consolidation, pleural effusion, pneumothorax. The cardiac silhouette is within normal limits. Moderate size hiatal hernia. No acute osseous pathology. IMPRESSION: 1. No active disease. 2. Moderate size hiatal hernia. Electronically Signed   By: Vanetta Chou M.D.   On: 02/29/2024 08:23     Assessment and Plan:  1. 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 He denies any associated chest pain and this was his prior anginal symptom back in 2009. - Hs troponin values have been negative and EKG is without acute ST changes.  -  While his symptoms have atypical qualities, he does have a history of multiple stents with known residual CAD by prior cardiac catheterization in 2019. Reviewed options with the patient in regards to overnight observation with likely stress test tomorrow morning vs. outpatient testing and he prefers outpatient stress testing. Will review with MD. Would recommend ambulating the patient and checking ambulatory HR/O2 levels and reassessing for symptoms prior to discharge.   2. CAD - He previously underwent stenting to the LAD and RCA in 2009 with PTCA of D1 at that time. Cardiac catheterization in 08/2017 showed patent stents with 70% D1 stenosis, 50% LCx stenosis and 60% mid RCA stenosis as discussed above. - Continue ASA 81 mg daily, Atorvastatin  10 mg daily, Imdur  90 mg daily, Valsartan 320 mg daily and Verapamil  180 mg daily.  3. HTN - He reports blood pressure has overall been well-controlled when checked at home. Continue current medical therapy.  4. HLD - LDL was at 72 when checked in 07/2023. Remains on Atorvastatin  10 mg daily as he previously had myalgias with higher dosing. If LDL remains above goal, would consider the addition of  Zetia.   For questions or updates, please contact [facility] Please consult [email or web address] for contact info under   Signed, Laymon CHRISTELLA Qua, PA-C  02/29/2024 11:52 AM

## 2024-03-18 ENCOUNTER — Telehealth: Payer: Self-pay | Admitting: Cardiology

## 2024-03-18 NOTE — Telephone Encounter (Signed)
 Patient notified that he should take medications prior to stress test.

## 2024-03-18 NOTE — Telephone Encounter (Signed)
 Pt c/o medication issue:  1. Name of Medication:   All medications  2. How are you currently taking this medication (dosage and times per day)?   3. Are you having a reaction (difficulty breathing--STAT)?   4. What is your medication issue?   Patient wants a call back to confirm he should take his morning medications as prescribed prior to his NM MYO MULT SPECT test.

## 2024-03-22 ENCOUNTER — Ambulatory Visit (HOSPITAL_COMMUNITY): Admission: RE | Admit: 2024-03-22 | Discharge: 2024-03-22 | Attending: Student

## 2024-03-22 ENCOUNTER — Other Ambulatory Visit: Payer: Self-pay | Admitting: Physician Assistant

## 2024-03-22 ENCOUNTER — Ambulatory Visit (HOSPITAL_COMMUNITY)
Admission: RE | Admit: 2024-03-22 | Discharge: 2024-03-22 | Disposition: A | Source: Ambulatory Visit | Attending: Student

## 2024-03-22 ENCOUNTER — Ambulatory Visit: Payer: Self-pay | Admitting: Student

## 2024-03-22 DIAGNOSIS — R0609 Other forms of dyspnea: Secondary | ICD-10-CM | POA: Diagnosis present

## 2024-03-22 DIAGNOSIS — I25118 Atherosclerotic heart disease of native coronary artery with other forms of angina pectoris: Secondary | ICD-10-CM | POA: Insufficient documentation

## 2024-03-22 LAB — NM MYOCAR MULTI W/SPECT W/WALL MOTION / EF
Angina Index: 0
Base ST Depression (mm): 0 mm
Duke Treadmill Score: 3
Estimated workload: 5.3
Exercise duration (min): 3 min
Exercise duration (sec): 27 s
LV dias vol: 75 mL (ref 62–150)
LV sys vol: 25 mL (ref 4.2–5.8)
MPHR: 146 {beats}/min
Nuc Stress EF: 67 %
Peak HR: 136 {beats}/min
Percent HR: 93 %
RATE: 0.4
RPE: 17
Rest HR: 67 {beats}/min
Rest Nuclear Isotope Dose: 10.3 mCi
SDS: 2
SRS: 4
SSS: 6
ST Depression (mm): 0 mm
Stress Nuclear Isotope Dose: 30.2 mCi
TID: 0.98

## 2024-03-22 LAB — ECHOCARDIOGRAM COMPLETE
AR max vel: 2.57 cm2
AV Area VTI: 2.59 cm2
AV Area mean vel: 2.61 cm2
AV Mean grad: 3.7 mmHg
AV Peak grad: 7.6 mmHg
Ao pk vel: 1.38 m/s
Area-P 1/2: 3.65 cm2
S' Lateral: 2.3 cm

## 2024-03-22 MED ORDER — TECHNETIUM TC 99M TETROFOSMIN IV KIT
10.3000 | PACK | Freq: Once | INTRAVENOUS | Status: AC | PRN
  Administered 2024-03-22: 09:00:00 10.3 via INTRAVENOUS

## 2024-03-22 MED ORDER — TECHNETIUM TC 99M TETROFOSMIN IV KIT
30.2000 | PACK | Freq: Once | INTRAVENOUS | Status: AC | PRN
  Administered 2024-03-22: 11:00:00 30.2 via INTRAVENOUS

## 2024-03-22 NOTE — Progress Notes (Signed)
*  PRELIMINARY RESULTS* Echocardiogram 2D Echocardiogram has been performed.  Dennis [name] 03/22/2024, 9:59 AM

## 2024-03-22 NOTE — Progress Notes (Signed)
°  ° °  Kay JONETTA Minus presented for a Exercise myoview  nuclear stress test today.  I Lorette CINDERELLA Kapur, PA-C, provided direct supervision and was present during the stress portion of the study today, which was completed without significant symptoms, immediate complications, or acute ST/T changes on ECG.  Stress imaging is pending at this time.  Preliminary ECG findings may be listed in the chart, but the stress test result will not be finalized until perfusion imaging is complete.  Lorette CINDERELLA Kapur, PA-C  03/22/2024, 11:05 AM

## 2024-03-24 ENCOUNTER — Ambulatory Visit: Attending: Nurse Practitioner | Admitting: Nurse Practitioner

## 2024-03-24 ENCOUNTER — Encounter: Payer: Self-pay | Admitting: Nurse Practitioner

## 2024-03-24 VITALS — BP 128/84 | HR 81 | Ht 66.0 in | Wt 203.2 lb

## 2024-03-24 DIAGNOSIS — I10 Essential (primary) hypertension: Secondary | ICD-10-CM | POA: Diagnosis not present

## 2024-03-24 DIAGNOSIS — E785 Hyperlipidemia, unspecified: Secondary | ICD-10-CM | POA: Diagnosis not present

## 2024-03-24 DIAGNOSIS — R0609 Other forms of dyspnea: Secondary | ICD-10-CM | POA: Diagnosis not present

## 2024-03-24 DIAGNOSIS — R202 Paresthesia of skin: Secondary | ICD-10-CM | POA: Diagnosis not present

## 2024-03-24 DIAGNOSIS — I2510 Atherosclerotic heart disease of native coronary artery without angina pectoris: Secondary | ICD-10-CM | POA: Diagnosis not present

## 2024-03-24 MED ORDER — NITROFURANTOIN MONOHYD MACRO 100 MG PO CAPS: 100.0000 mg | ORAL_CAPSULE | ORAL | Status: AC | PRN

## 2024-03-24 MED ORDER — NITROGLYCERIN 0.4 MG SL SUBL: 0.4000 mg | SUBLINGUAL_TABLET | SUBLINGUAL | 3 refills | Status: AC | PRN

## 2024-03-24 NOTE — Patient Instructions (Signed)
 Medication Instructions:   Nitroglycerin  refilled today  Continue all other medications.     Labwork:  none  Testing/Procedures:  none  Follow-Up:  Keep as scheduled with Dr. Alvan for March   Any Other Special Instructions Will Be Listed Below (If Applicable).   If you need a refill on your cardiac medications before your next appointment, please call your pharmacy.

## 2024-03-24 NOTE — Progress Notes (Unsigned)
°  Cardiology Office Note   Date:  03/24/2024  ID:  Dennis [name], DOB 08-25-1950, MRN [id number] PCP: Sheryle Carwin, MD  [facility] Providers Cardiologist:  Alvan Carrier, MD { Click to update primary MD,subspecialty MD or APP then REFRESH:1}    History of Present Illness Dennis [name] is a 73 y.o. male with a pmh of CAD, hypertension, hyperlipidemia, dizziness, who presents today for chest pain evaluation.  Last seen by Dr. Alvan on December 15, 2023.  Was overall doing well, was noted that his prior chest pain episode had resolved with increase in Imdur .  Most recent stress test from June 2024 was negative for ischemia.  BP was above goal, recommended to titrate verapamil  in the future if needed.  Recent ED visit on February 29, 2024 for shortness of breath.  He noted some substernal chest pain with exertion as well as mid back pain.  Workup was overall reassuring.  Was told to follow-up with cardiology outpatient.  He   ROS: ***  Studies Reviewed      *** Risk Assessment/Calculations {Does this patient have ATRIAL FIBRILLATION?:[phone]}         Physical Exam VS:  BP 128/84   Pulse 81   Ht 5' 6 (1.676 m)   Wt 203 lb 3.2 oz (92.2 kg)   SpO2 95%   BMI 32.80 kg/m        Wt Readings from Last 3 Encounters:  03/24/24 203 lb 3.2 oz (92.2 kg)  02/29/24 195 lb (88.5 kg)  12/21/23 201 lb (91.2 kg)    GEN: Well nourished, well developed in no acute distress NECK: No JVD; No carotid bruits CARDIAC: ***RRR, no murmurs, rubs, gallops RESPIRATORY:  Clear to auscultation without rales, wheezing or rhonchi  ABDOMEN: Soft, non-tender, non-distended EXTREMITIES:  No edema; No deformity   ASSESSMENT AND PLAN ***    {Are you ordering a CV Procedure (e.g. stress test, cath, DCCV, TEE, etc)?   Press F2        :[id number]}  Dispo: ***  Signed, Almarie Crate, NP

## 2024-06-07 ENCOUNTER — Ambulatory Visit: Admitting: Cardiology

## 2024-12-14 ENCOUNTER — Ambulatory Visit: Admitting: Urology
